# Patient Record
Sex: Male | Born: 1955 | Race: White | Hispanic: No | Marital: Married
Health system: Midwestern US, Academic
[De-identification: ages and names within clinical notes are randomized; demographics above are authoritative.]

## PROBLEM LIST (undated history)

## (undated) DIAGNOSIS — N529 Male erectile dysfunction, unspecified: Secondary | ICD-10-CM

## (undated) DIAGNOSIS — T7840XA Allergy, unspecified, initial encounter: Secondary | ICD-10-CM

## (undated) DIAGNOSIS — I10 Essential (primary) hypertension: Secondary | ICD-10-CM

## (undated) DIAGNOSIS — J302 Other seasonal allergic rhinitis: Secondary | ICD-10-CM

## (undated) DIAGNOSIS — S82899A Other fracture of unspecified lower leg, initial encounter for closed fracture: Secondary | ICD-10-CM

## (undated) DIAGNOSIS — M1990 Unspecified osteoarthritis, unspecified site: Secondary | ICD-10-CM

## (undated) DIAGNOSIS — K222 Esophageal obstruction: Secondary | ICD-10-CM

## (undated) DIAGNOSIS — E785 Hyperlipidemia, unspecified: Secondary | ICD-10-CM

## (undated) DIAGNOSIS — K5790 Diverticulosis of intestine, part unspecified, without perforation or abscess without bleeding: Secondary | ICD-10-CM

## (undated) DIAGNOSIS — K449 Diaphragmatic hernia without obstruction or gangrene: Secondary | ICD-10-CM

## (undated) DIAGNOSIS — H9191 Unspecified hearing loss, right ear: Secondary | ICD-10-CM

## (undated) DIAGNOSIS — K279 Peptic ulcer, site unspecified, unspecified as acute or chronic, without hemorrhage or perforation: Secondary | ICD-10-CM

## (undated) DIAGNOSIS — E119 Type 2 diabetes mellitus without complications: Secondary | ICD-10-CM

## (undated) DIAGNOSIS — C61 Malignant neoplasm of prostate: Secondary | ICD-10-CM

## (undated) DIAGNOSIS — K219 Gastro-esophageal reflux disease without esophagitis: Secondary | ICD-10-CM

## (undated) DIAGNOSIS — S0292XA Unspecified fracture of facial bones, initial encounter for closed fracture: Secondary | ICD-10-CM

## (undated) DIAGNOSIS — I2510 Atherosclerotic heart disease of native coronary artery without angina pectoris: Secondary | ICD-10-CM

## (undated) HISTORY — DX: Hyperlipidemia, unspecified: E78.5

## (undated) HISTORY — DX: Type 2 diabetes mellitus without complications: E11.9

## (undated) HISTORY — DX: Allergy, unspecified, initial encounter: T78.40XA

## (undated) HISTORY — DX: Diaphragmatic hernia without obstruction or gangrene: K44.9

## (undated) HISTORY — DX: Atherosclerotic heart disease of native coronary artery without angina pectoris: I25.10

## (undated) HISTORY — DX: Male erectile dysfunction, unspecified: N52.9

## (undated) HISTORY — DX: Essential (primary) hypertension: I10

## (undated) HISTORY — DX: Unspecified fracture of facial bones, initial encounter for closed fracture: S02.92XA

## (undated) HISTORY — DX: Unspecified hearing loss, right ear: H91.91

## (undated) HISTORY — PX: INGUINAL HERNIA REPAIR: SUR1180

## (undated) HISTORY — DX: Other fracture of unspecified lower leg, initial encounter for closed fracture: S82.899A

## (undated) HISTORY — PX: JOINT REPLACEMENT: SHX530

## (undated) HISTORY — PX: KNEE ARTHROSCOPY: SUR90

## (undated) HISTORY — DX: Esophageal obstruction: K22.2

## (undated) HISTORY — DX: Diverticulosis of intestine, part unspecified, without perforation or abscess without bleeding: K57.90

## (undated) HISTORY — PX: VASECTOMY: SHX75

## (undated) HISTORY — PX: UPPER GASTROINTESTINAL ENDOSCOPY: SHX188

## (undated) HISTORY — PX: FACIAL FRACTURE SURGERY: SHX1570

## (undated) HISTORY — DX: Unspecified osteoarthritis, unspecified site: M19.90

## (undated) HISTORY — DX: Peptic ulcer, site unspecified, unspecified as acute or chronic, without hemorrhage or perforation: K27.9

## (undated) HISTORY — PX: TONSILLECTOMY: SUR1361

## (undated) HISTORY — PX: FRACTURE SURGERY: SHX138

## (undated) HISTORY — DX: Other seasonal allergic rhinitis: J30.2

## (undated) HISTORY — DX: Malignant neoplasm of prostate: C61

## (undated) HISTORY — DX: Gastro-esophageal reflux disease without esophagitis: K21.9

---
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
## 1977-07-15 DIAGNOSIS — S0292XA Unspecified fracture of facial bones, initial encounter for closed fracture: Secondary | ICD-10-CM

## 1977-07-15 HISTORY — DX: Unspecified fracture of facial bones, initial encounter for closed fracture: S02.92XA

## 1987-07-16 DIAGNOSIS — K279 Peptic ulcer, site unspecified, unspecified as acute or chronic, without hemorrhage or perforation: Secondary | ICD-10-CM

## 1987-07-16 HISTORY — DX: Peptic ulcer, site unspecified, unspecified as acute or chronic, without hemorrhage or perforation: K27.9

## 1998-09-25 ENCOUNTER — Encounter: Payer: Self-pay | Admitting: Gastroenterology

## 1998-09-25 ENCOUNTER — Other Ambulatory Visit: Admission: RE | Admit: 1998-09-25 | Discharge: 1998-09-25 | Payer: Self-pay | Admitting: Gastroenterology

## 2005-03-12 ENCOUNTER — Ambulatory Visit: Payer: Self-pay | Admitting: Gastroenterology

## 2006-07-15 DIAGNOSIS — S82899A Other fracture of unspecified lower leg, initial encounter for closed fracture: Secondary | ICD-10-CM

## 2006-07-15 HISTORY — DX: Other fracture of unspecified lower leg, initial encounter for closed fracture: S82.899A

## 2006-07-15 HISTORY — PX: ANKLE SURGERY: SHX546

## 2006-09-29 ENCOUNTER — Ambulatory Visit: Payer: Self-pay | Admitting: Gastroenterology

## 2006-10-29 ENCOUNTER — Encounter (INDEPENDENT_AMBULATORY_CARE_PROVIDER_SITE_OTHER): Payer: Self-pay | Admitting: Specialist

## 2006-10-29 ENCOUNTER — Ambulatory Visit: Payer: Self-pay | Admitting: Gastroenterology

## 2007-01-17 ENCOUNTER — Inpatient Hospital Stay (HOSPITAL_COMMUNITY): Admission: EM | Admit: 2007-01-17 | Discharge: 2007-01-18 | Payer: Self-pay | Admitting: Emergency Medicine

## 2007-04-01 ENCOUNTER — Ambulatory Visit (HOSPITAL_BASED_OUTPATIENT_CLINIC_OR_DEPARTMENT_OTHER): Admission: RE | Admit: 2007-04-01 | Discharge: 2007-04-01 | Payer: Self-pay | Admitting: Orthopedic Surgery

## 2007-09-18 ENCOUNTER — Ambulatory Visit: Payer: Self-pay | Admitting: Gastroenterology

## 2008-09-15 ENCOUNTER — Ambulatory Visit: Payer: Self-pay | Admitting: Gastroenterology

## 2008-09-15 DIAGNOSIS — K219 Gastro-esophageal reflux disease without esophagitis: Secondary | ICD-10-CM | POA: Insufficient documentation

## 2008-09-15 DIAGNOSIS — R198 Other specified symptoms and signs involving the digestive system and abdomen: Secondary | ICD-10-CM | POA: Insufficient documentation

## 2008-09-15 DIAGNOSIS — K644 Residual hemorrhoidal skin tags: Secondary | ICD-10-CM | POA: Insufficient documentation

## 2008-09-15 DIAGNOSIS — K649 Unspecified hemorrhoids: Secondary | ICD-10-CM | POA: Insufficient documentation

## 2009-02-12 DIAGNOSIS — C61 Malignant neoplasm of prostate: Secondary | ICD-10-CM

## 2009-02-12 HISTORY — DX: Malignant neoplasm of prostate: C61

## 2009-07-15 HISTORY — PX: PROSTATECTOMY: SHX69

## 2009-09-13 ENCOUNTER — Telehealth: Payer: Self-pay | Admitting: Gastroenterology

## 2009-10-03 ENCOUNTER — Ambulatory Visit: Payer: Self-pay | Admitting: Gastroenterology

## 2010-05-07 ENCOUNTER — Ambulatory Visit
Admission: RE | Admit: 2010-05-07 | Discharge: 2010-06-11 | Payer: Self-pay | Source: Home / Self Care | Admitting: Radiation Oncology

## 2010-07-10 ENCOUNTER — Inpatient Hospital Stay (HOSPITAL_COMMUNITY)
Admission: RE | Admit: 2010-07-10 | Discharge: 2010-07-11 | Payer: Self-pay | Source: Home / Self Care | Attending: Urology | Admitting: Urology

## 2010-07-24 ENCOUNTER — Telehealth: Payer: Self-pay | Admitting: Gastroenterology

## 2010-08-14 NOTE — Procedures (Signed)
Summary: Gastroenterology-EGD  Gastroenterology-EGD   Imported By: Lowry Ram CMA 09/14/2008 09:18:00  _____________________________________________________________________  External Attachment:    Type:   Image     Comment:   External Document

## 2010-08-14 NOTE — Procedures (Signed)
Summary: Colonoscopy and pathology   Colonoscopy  Procedure date:  10/29/2006  Findings:      Results: Hemorrhoids.     Results: Diverticulosis.       Pathology:  Hyperplastic polyp.     Location:  [facility].    Procedures Next Due Date:    Colonoscopy: 11/2016  Colonoscopy  Procedure date:  10/29/2006  Findings:      Results: Hemorrhoids.     Results: Diverticulosis.       Pathology:  Hyperplastic polyp.     Location:  [facility].    Procedures Next Due Date:    Colonoscopy: 11/2016  Patient Name: Jeff Jenkins, Jeff Jenkins. MRN:  Procedure Procedures: Colonoscopy CPT: 4840379878.    with biopsy. CPT: Q5068410.  Personnel: Endoscopist: Venita Lick. Russella Dar, MD, Clementeen Graham.  Exam Location: Exam performed in Outpatient Clinic. Outpatient  Patient Consent: Procedure, Alternatives, Risks and Benefits discussed, consent obtained, from patient. Consent was obtained by the RN.  Indications Symptoms: Hematochezia.  History  Current Medications: Patient is not currently taking Coumadin.  Pre-Exam Physical: Performed Oct 29, 2006. Cardio-pulmonary exam, Rectal exam, HEENT exam , Abdominal exam, Mental status exam WNL.  Comments: Pt. history reviewed/updated, physical exam performed prior to initiation of sedation?Yes Exam Exam: Extent of exam reached: Cecum, extent intended: Cecum.  The cecum was identified by appendiceal orifice and IC valve. Time to Cecum: 00:04: 48. Time for Withdrawl: 00:09:49. Colon retroflexion performed. ASA Classification: II. Tolerance: excellent.  Monitoring: Pulse and BP monitoring, Oximetry used. Supplemental O2 given.  Colon Prep Used MoviPrep for colon prep. Prep results: excellent.  Sedation Meds: Patient assessed and found to be appropriate for moderate (conscious) sedation. Fentanyl 100 mcg. given IV. Versed 10 mg. given IV.  Findings - DIVERTICULOSIS: Sigmoid Colon. Not bleeding. ICD9: Diverticulosis: 562.10.  NORMAL  EXAM: Cecum to Descending Colon.  MULTIPLE POLYPS: Rectum. minimum size 2 mm, maximum size 3 mm. Procedure:  biopsy without cautery, removed, Polyp retrieved, 2 polyps Polyps sent to pathology. ICD9: Colon Polyps: 211.3.  HEMORRHOIDS: Internal. Size: Small. Not bleeding. Not thrombosed. ICD9: Hemorrhoids, Internal: 455.0.   Assessment  Diagnoses: 562.10: Diverticulosis.  211.3: Colon Polyps.  455.0: Hemorrhoids, Internal.   Events  Unplanned Interventions: No intervention was required.  Unplanned Events: There were no complications. Plans  Post Exam Instructions: Post sedation instructions given.  Medication Plan: Await pathology.  Patient Education: Patient given standard instructions for: Polyps. Diverticulosis. Hemorrhoids.  Disposition: After procedure patient sent to recovery. After recovery patient sent home.  Scheduling/Referral: Colonoscopy, to [facility] T. Russella Dar, MD, [facility], if polyp(s) adenomatous, around Oct 29, 2011.    This report was created from the original endoscopy report, which was reviewed and signed by the above listed endoscopist.             SP Surgical Pathology - STATUS: Final             By: SMIR MD , Jessica Priest           Perform Date: 25Apr08 00:01  Ordered By: Pleas Koch MD , MALCOLM         Ordered Date: 51Apr08 14:43  Facility: [facility]                               Department: CPATH  Service Report Text  [facility]   [address] [address]   [place], [place] [address]   Telephone [phone] or [phone] Fax [phone]    REPORT OF SURGICAL PATHOLOGY    Case #: [id number]   Patient Name: Jeff Jenkins, Jeff Jenkins.   Office Chart Number: [id number]    MRN: [id number]   Pathologist: Havery Moros, MD   DOB/Age 04/25/1956 (Age: 54) Gender: M   Date Taken: 10/29/2006   Date Received: 10/30/2006    FINAL DIAGNOSIS    ***MICROSCOPIC EXAMINATION AND DIAGNOSIS***    RECTUM, POLYP(S): HYPERPLASTIC POLYP(S). NO ADENOMATOUS  CHANGE   OR MALIGNANCY IDENTIFIED.    mj   Date Reported: 10/31/2006 Havery Moros, MD   *** Electronically Signed Out By BNS ***    Clinical information   Rectal bleeding. (cdc)    specimen(s) obtained   Rectum, polyp(s) x 2    Gross Description   Received in formalin are tan, soft tissue fragments that are   submitted in toto. Number: Two   Size: 0.3 m (SP:mj 10/30/06)    mj/

## 2010-08-14 NOTE — Assessment & Plan Note (Signed)
Summary: REV PER RECALL LETTER.Marland KitchenEM   History of Present Illness Visit Type: follow up Primary GI MD: Elie Goody MD [facility] Primary Provider: Merri Brunette, MD Chief Complaint: acid reflux History of Present Illness:   Jeff Jenkins relates occasional breakthrough reflux symptoms related to eating late, certain foods and alcohol. His symptoms generally remain under very good control. He reports an episode of constipation related to a Medrol Dosepak. He occasionally notes a loose stool occurring within 10-20 minutes after a formed bowel movement. He also relates occasional external hemorrhoid discomfort and bleeding.   GI Review of Systems    Reports acid reflux, belching, and  bloating.      Denies abdominal pain, chest pain, dysphagia with liquids, dysphagia with solids, heartburn, loss of appetite, nausea, vomiting, vomiting blood, weight loss, and  weight gain.      Reports constipation.     Denies anal fissure, black tarry stools, change in bowel habit, diarrhea, diverticulosis, fecal incontinence, heme positive stool, hemorrhoids, irritable bowel syndrome, jaundice, light color stool, liver problems, rectal bleeding, and  rectal pain.   Prior Medications Reviewed Using: Patient Recall  Updated Prior Medication List: NEXIUM 40 MG CPDR (ESOMEPRAZOLE MAGNESIUM) one tablet by mouth once daily ALLEGRA-D 24 HOUR 180-240 MG XR24H-TAB (FEXOFENADINE-PSEUDOEPHEDRINE) as needed PERCOCET 5-325 MG TABS (OXYCODONE-ACETAMINOPHEN) Take 1 every 6 hours as needed  Current Allergies: No known allergies  Past Medical History:    Reviewed history from 09/14/2008 and no changes required:       GERD       Peptic stricture       Diverticulosis       Hemorrhoids       Peptic Ulcer Disease 1989       Facial fracture 1979       Right ear hearing loss       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     Alcohol Use - yes    Daily Caffeine Use    Illicit Drug Use - no    Patient does not get regular exercise.   Risk Factors:  Tobacco use:  never Drug use:  no Alcohol use:  yes Exercise:  no  Review of Systems       The patient complains of allergy/sinus, arthritis/joint pain, cough, muscle pains/cramps, nosebleeds, swelling of feet/legs, and urine leakage.         The pertinent positives and negatives are noted as above and in the HPI. All other ROS were negative.   Vital Signs:  Patient Profile:   55 Years Old Male Height:     72 inches Weight:      227.13 pounds Pulse rate:   88 / minute Pulse rhythm:   regular BP sitting:   140 / 94  (left arm)  Vitals Entered By: June McMurray CMA (September 15, 2008 9:26 AM)                  Physical Exam  General:     Well developed, well nourished, no acute distress. Head:     Normocephalic and atraumatic.  Mouth:     No deformity or lesions, dentition normal. Lungs:     Clear throughout to auscultation. Heart:     Regular rate and rhythm; no murmurs, rubs,  or bruits. Abdomen:     Soft, nontender and nondistended. No masses, hepatosplenomegaly or hernias noted. Normal bowel sounds. Psych:     Alert and cooperative. Normal mood and affect.   Impression & Recommendations:  Problem # 1:  GERD (ICD-530.81) Maintain standard antireflux measures. Begin Nexium 40 mg q.a.m. for one year.  Problem # 2:  CHANGE IN BOWELS (ICD-787.99) Increase fiber and water intake. Start a daily fiber supplement. If his constipation and loose stools are not substantially improved with this regimen he will return for further followup.  Problem # 3:  HEMORRHOIDS (ICD-455.6) Anusol-HC cream b.i.d. p.r.n. and standard rectal and  hemorrhoidal care instructions.  Patient Instructions: 1)  Start a daily fiber supplement such as Benefiber or Citrucel clear.  2)  Fill your prescription at your pharmacy or mail order.  3)  Rectal care instructions given.  4)  Please schedule a follow-up appointment in 1 year. 5)  Copy Sent To: Merri Brunette, MD  Prescriptions: NEXIUM 40 MG CPDR (ESOMEPRAZOLE MAGNESIUM) one tablet by mouth once daily  #90 x 3   Entered by:   Christie Nottingham CMA   Authorized by:   Meryl Dare MD [facility]   Signed by:   Meryl Dare MD FACG on 09/15/2008   Method used:   Print then Give to Patient   RxID:   [id number]   Appended Document: REV PER RECALL LETTER.Marland KitchenEM    Clinical Lists Changes  Medications: Rx of NEXIUM 40 MG CPDR (ESOMEPRAZOLE MAGNESIUM) one tablet by mouth once daily;  #90 x 3;  Signed;  Entered by: Christie Nottingham CMA;  Authorized by: Meryl Dare MD FACG;  Method used: Print then Give to Patient    Prescriptions: NEXIUM 40 MG CPDR (ESOMEPRAZOLE MAGNESIUM) one tablet by mouth once daily  #90 x 3   Entered by:   Christie Nottingham CMA   Authorized by:   Meryl Dare MD [facility]   Signed by:   Christie Nottingham CMA on 09/15/2008   Method used:   Print then Give to Patient   RxID:   [id number]

## 2010-08-14 NOTE — Progress Notes (Signed)
Summary: refill   Phone Note Call from Patient Call back at [phone]   Caller: Patient Call For: Russella Dar Reason for Call: Refill Medication, Talk to Nurse Summary of Call: Patient would like refills for his nexium until his appt 3-23 Initial call taken by: Tawni Levy,  September 13, 2009 8:40 AM  Follow-up for Phone Call        sent 30 day rx until patietn comes for his office visit.  Follow-up by: Harlow Mares CMA (AAMA),  September 13, 2009 9:01 AM    New/Updated Medications: NEXIUM 40 MG CPDR (ESOMEPRAZOLE MAGNESIUM) one tablet by mouth once daily Prescriptions: NEXIUM 40 MG CPDR (ESOMEPRAZOLE MAGNESIUM) one tablet by mouth once daily  #30 x 0   Entered by:   Harlow Mares CMA (AAMA)   Authorized by:   Meryl Dare MD [facility]   Signed by:   Harlow Mares CMA (AAMA) on 09/13/2009   Method used:   Electronically to        [facility]* (retail)       [address] [place], [place]  [address]       Ph: [phone] or [phone]       Fax: [phone]   RxID:   [id number]

## 2010-08-14 NOTE — Assessment & Plan Note (Signed)
Summary: YEARLY RX RENEWAL/YF   History of Present Illness Visit Type: Follow-up Visit Primary GI MD: Elie Goody MD [facility] Primary Provider: Merri Brunette, MD Chief Complaint: Patient here for his yearly follow up of his gerd, states that he has a little reflux but nothing major. He needs refills on his Nexium.  History of Present Illness:   This is a return office visit for GERD that is well-controlled. He has not had recurrent dysphagia. He notes hard stools for the past several months. Occasional rectal discomfort and small amounts of rectal bleeding. He has no internal hemorrhoids by colonoscopy in April 2008.   GI Review of Systems    Reports acid reflux.      Denies abdominal pain, belching, bloating, chest pain, dysphagia with liquids, dysphagia with solids, heartburn, loss of appetite, nausea, vomiting, vomiting blood, weight loss, and  weight gain.      Reports change in bowel habits.     Denies anal fissure, black tarry stools, constipation, diarrhea, diverticulosis, fecal incontinence, heme positive stool, hemorrhoids, irritable bowel syndrome, jaundice, light color stool, liver problems, rectal bleeding, and  rectal pain.   Current Medications (verified): 1)  Nexium 40 Mg Cpdr (Esomeprazole Magnesium) .... One Tablet By Mouth Once Daily 2)  Allegra-D 24 Hour 180-240 Mg Xr24h-Tab (Fexofenadine-Pseudoephedrine) .... As Needed 3)  Benadryl 25 Mg Tabs (Diphenhydramine Hcl) .... 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  Alcohol Use - yes Illicit Drug Use - no Patient does not get regular exercise.   Review of Systems       The patient complains of allergy/sinus, arthritis/joint pain, muscle pains/cramps, urination - excessive, and urine leakage.         The pertinent positives and negatives are noted as above and in the HPI. All other ROS were reviewed and were negative.   Vital Signs:  Patient profile:   55 year old male Height:      72 inches Weight:      213.8 pounds BMI:     29.10 Pulse rate:   80 / minute Pulse rhythm:   regular BP sitting:   140 / 82  (left arm) Cuff size:   regular  Vitals Entered By: Harlow Mares CMA Duncan Dull) (October 03, 2009 9:33 AM)  Physical Exam  General:  Well developed, well nourished, no acute distress. Head:  Normocephalic and atraumatic. Eyes:  PERRLA, no icterus. Mouth:  No deformity or lesions, dentition normal. Lungs:  Clear throughout to auscultation. Heart:  Regular rate and rhythm; no murmurs, rubs,  or bruits. Abdomen:  Soft, nontender and nondistended. No masses, hepatosplenomegaly or hernias noted. Normal bowel sounds. Psych:  Alert and cooperative. Normal mood and affect.  Impression & Recommendations:  Problem # 1:  GERD (ICD-530.81) Continue Nexium 40 mg q.a.m. If his dysphagia returns or if his reflux symptoms are not controlled, he is advised to contact us.  Problem # 2:  CHANGE IN BOWELS (ICD-787.99) Mild constipation with hard stools and occasional hemorrhoidal  symptoms. Substantially increase daily fiber and water intake. Begin daily Benefiber supplements. Preparation H with hydrocortisone. Standard rectal care instructions. If symptoms do not resolve within the next few weeks return for further followup.  Problem # 3:  SCREENING COLORECTAL-CANCER (ICD-V76.51) Colonoscopy for colorectal cancer screening April  2018.  Patient Instructions: 1)  Start Benefiber once daily. 2)  Pick up your prescription from your pharmacy.  3)  High Fiber, Low Fat  Healthy Eating Plan brochure given.  4)  Rectal care instructions given.  5)  Copy sent to : Merri Brunette, MD 6)  The medication list was reviewed and reconciled.  All changed / newly prescribed medications were explained.  A complete medication list was provided to the patient / caregiver. 7)  Please schedule a follow-up appointment in 1 year.  Prescriptions: NEXIUM 40 MG CPDR (ESOMEPRAZOLE MAGNESIUM) one tablet by mouth once daily  #90 x 3   Entered by:   Christie Nottingham CMA (AAMA)   Authorized by:   Meryl Dare MD [facility]   Signed by:   Christie Nottingham CMA Duncan Dull) on 10/03/2009   Method used:   Electronically to        [facility]* (mail-order)       [facility]       [address]       [place], [place]  [address]       Ph: [phone]       Fax: [phone]   RxID:   [id number] NEXIUM 40 MG CPDR (ESOMEPRAZOLE MAGNESIUM) one tablet by mouth once daily  #90 x 3   Entered by:   Christie Nottingham CMA (AAMA)   Authorized by:   Meryl Dare MD [facility]   Signed by:   Christie Nottingham CMA (AAMA) on 10/03/2009   Method used:   Electronically to        [facility]* (retail)       [address] [place], [place]  [address]       Ph: [phone] or [phone]       Fax: [phone]   RxID:   [id number]

## 2010-08-16 NOTE — Progress Notes (Signed)
Summary: refill  Medications Added NEXIUM 40 MG CPDR (ESOMEPRAZOLE MAGNESIUM) one tablet by mouth once daily       Phone Note Call from Patient Call back at [phone]   Caller: Patient Call For: Dr Russella Dar Summary of Call: Patient needs refills for his Nexium for a three months supply sent to [facility] Initial call taken by: Tawni Levy,  July 24, 2010 4:16 PM  Follow-up for Phone Call        Rx was sent to pts pharmacy and told pt that he is due for a follow-up visit in 3 months. Pt agreed and verbalized understanding. Follow-up by: Christie Nottingham CMA Duncan Dull),  July 24, 2010 4:23 PM    New/Updated Medications: NEXIUM 40 MG CPDR (ESOMEPRAZOLE MAGNESIUM) one tablet by mouth once daily Prescriptions: NEXIUM 40 MG CPDR (ESOMEPRAZOLE MAGNESIUM) one tablet by mouth once daily  #90 x 0   Entered by:   Christie Nottingham CMA (AAMA)   Authorized by:   Meryl Dare MD [facility]   Signed by:   Christie Nottingham CMA Duncan Dull) on 07/24/2010   Method used:   Electronically to        [facility]* (mail-order)       [address] [place], [place]  [address]       Ph: [phone]       Fax: [phone]   RxID:   [id number]   Appended Document: refill Pt states he wants the prescription printed and sent in with his mail order form because he has never used [facility] before. Printed Rx and left out front for pt to pick up tomorrow.   Clinical Lists Changes  Medications: Rx of NEXIUM 40 MG CPDR (ESOMEPRAZOLE MAGNESIUM) one tablet by mouth once daily;  #90 x 0;  Signed;  Entered by: Christie Nottingham CMA (AAMA);  Authorized by: Meryl Dare MD FACG;  Method used: Print then Give to Patient    Prescriptions: NEXIUM 40 MG CPDR (ESOMEPRAZOLE MAGNESIUM) one tablet by mouth once daily  #90 x 0   Entered by:   Christie Nottingham CMA (AAMA)   Authorized by:   Meryl Dare MD [facility]   Signed by:   Christie Nottingham CMA (AAMA) on 07/24/2010   Method used:   Print then  Give to Patient   RxID:   [id number]

## 2010-09-24 LAB — CBC
HCT: 43.4 % (ref 39.0–52.0)
Hemoglobin: 15.3 g/dL (ref 13.0–17.0)
MCH: 31.5 pg (ref 26.0–34.0)
MCHC: 35.3 g/dL (ref 30.0–36.0)
MCV: 89.5 fL (ref 78.0–100.0)
Platelets: 157 10*3/uL (ref 150–400)
RBC: 4.85 MIL/uL (ref 4.22–5.81)
RDW: 12.1 % (ref 11.5–15.5)
WBC: 5.6 10*3/uL (ref 4.0–10.5)

## 2010-09-24 LAB — BASIC METABOLIC PANEL
BUN: 10 mg/dL (ref 6–23)
CO2: 27 mEq/L (ref 19–32)
Calcium: 8.8 mg/dL (ref 8.4–10.5)
Chloride: 102 mEq/L (ref 96–112)
Creatinine, Ser: 1.01 mg/dL (ref 0.4–1.5)
GFR calc Af Amer: >60 mL/min (ref >60)
GFR calc non Af Amer: >60 mL/min (ref >60)
Glucose, Bld: 187 mg/dL — ABNORMAL HIGH (ref 70–99)
Potassium: 3.9 mEq/L (ref 3.5–5.1)
Sodium: 137 mEq/L (ref 135–145)

## 2010-09-24 LAB — HEMOGLOBIN AND HEMATOCRIT, BLOOD
HCT: 41.5 % (ref 39.0–52.0)
HCT: 44.6 % (ref 39.0–52.0)
Hemoglobin: 14.2 g/dL (ref 13.0–17.0)
Hemoglobin: 15.4 g/dL (ref 13.0–17.0)

## 2010-09-24 LAB — TYPE AND SCREEN
ABO/RH(D): A POS
Antibody Screen: NEGATIVE

## 2010-09-24 LAB — SURGICAL PCR SCREEN
MRSA, PCR: NEGATIVE
Staphylococcus aureus: NEGATIVE

## 2010-09-24 LAB — ABO/RH: ABO/RH(D): A POS

## 2010-10-15 ENCOUNTER — Ambulatory Visit (INDEPENDENT_AMBULATORY_CARE_PROVIDER_SITE_OTHER): Payer: Self-pay | Admitting: Gastroenterology

## 2010-10-15 ENCOUNTER — Encounter: Payer: Self-pay | Admitting: Gastroenterology

## 2010-10-15 DIAGNOSIS — Z1211 Encounter for screening for malignant neoplasm of colon: Secondary | ICD-10-CM

## 2010-10-15 DIAGNOSIS — K219 Gastro-esophageal reflux disease without esophagitis: Secondary | ICD-10-CM

## 2010-10-15 MED ORDER — ESOMEPRAZOLE MAGNESIUM 40 MG PO CPDR: 40.0000 mg | DELAYED_RELEASE_CAPSULE | Freq: Every day | ORAL | Status: DC

## 2010-10-15 NOTE — Assessment & Plan Note (Addendum)
Reflux symptoms under very good control on Nexium and antireflux measures. Refill Nexium 40 mg every morning. I discussed the potential of osteoporosis and magnesium malabsorption on chronic proton pump inhibitor therapy. Discussed the option of having a bone scan and having magnesium levels checked at his routine followup with Dr. Renne Crigler.

## 2010-10-15 NOTE — Patient Instructions (Signed)
Nexium has been sent to your mail order pharmacy at [facility] for a 90 day supply. Nexium samples have been given to take while waiting on prescription in the mail.

## 2010-10-15 NOTE — Progress Notes (Signed)
History of Present Illness: This is a 55 year old male with chronic GERD is well-controlled on Nexium 40 mg daily. He relates very rare episodes of breakthrough symptoms when he eats late at night. He has no dysphasia, weight loss, chest pain, abdominal pain, change in bowel habits, melena, hematochezia.  Current Medications, Allergies, Past Medical History, Past Surgical History, Family History and Social History were reviewed in [employer] record.  Physical Exam: General: Well developed , well nourished, no acute distress Head: Normocephalic and atraumatic Eyes:  sclerae anicteric, EOMI Ears: Normal auditory acuity Mouth: No deformity or lesions Lungs: Clear throughout to auscultation Heart: Regular rate and rhythm; no murmurs, rubs or bruits Abdomen: Soft, non tender and non distended. No masses, hepatosplenomegaly or hernias noted. Normal Bowel sounds Musculoskeletal: Symmetrical with no gross deformities  Extremities: No clubbing, cyanosis, edema or deformities noted Neurological: Alert oriented x 4, grossly nonfocal Psychological:  Alert and cooperative. Normal mood and affect  Assessment and Recommendations:

## 2010-10-15 NOTE — Assessment & Plan Note (Signed)
Average risk for colorectal cancer. Screening colonoscopy recommended April 2018.

## 2010-11-27 NOTE — Op Note (Signed)
NAMEJAHAZIEL, FRANCOIS               ACCOUNT NO.:  [id number]   MEDICAL RECORD NO.:  [id number]          PATIENT TYPE:  INP   LOCATION:  0454                         FACILITY:  [facility]   PHYSICIAN:  Marlowe Kays, M.D.  DATE OF BIRTH:  08-11-1955   DATE OF PROCEDURE:  01/17/2007  DATE OF DISCHARGE:                               OPERATIVE REPORT   PREOPERATIVE DIAGNOSIS:  Fracture distal fibula with widening of ankle  mortise and suspected that deltoid ligament tear.   POSTOPERATIVE DIAGNOSIS:  Fracture distal left fibula with complete  avulsion of the deltoid ligament off the medial malleolus and lateral  subluxation of the talus.   OPERATION:  1. Compression plating distal left fibula with five hole plate and two      syndesmosis screws with a reconstruction of the syndesmosis.  2. Repair of deltoid ligament avulsion off the medial malleolus using      rotator cuff anchor.   SURGEON:  Marlowe Kays, M.D.   ASSISTANT:  Mr. Adrian Blackwater.   ANESTHESIA:  General anesthesia.   PATHOLOGY AND JUSTIFICATION FOR PROCEDURE:  This man slipped and fell  earlier today with x-rays demonstrating the above pathology.  He is in  good health and is here today for surgical repair.   DESCRIPTION OF PROCEDURE:  Prophylactic antibiotics, satisfactory  general anesthesia, time-out performed, pneumatic tourniquet with the  left leg Esmarched out nonsterilely and prepped with DuraPrep from mid  calf to toes, draped in sterile field.  First made a curved incision  anterior to the medial malleolus, went through subcutaneous tissue,  large amount of blood came forth.  The deltoid ligament had completely  avulsed off the medial malleolus with no remnant remaining and was  invaginated between the medial malleolus and the talus.  After  extracting it and manually slightly closing down the subluxation, I then  used a C-arm and made a lateral incision over the distal fibula working  above the fracture  site and locating appropriate position for  syndesmosis screws.  I selected a five-hole plate with two holes distal  to the fracture site which was all the fibula with hold because of its  distal extent and one hole at the fracture site which I was able to use  for syndesmosis screw and two holes more proximal.  I first prebent the  plate to conform to the distal fibular anatomy and then placed two loose  but stabilizing screws at both ends of the plate, checking the position  with C-arm, in each case drilling, measuring and screwing.  I then  placed and tightened down the third nonsyndesmosis screws and then  placed two screws across the syndesmosis, checking position on AP and  lateral x-rays.  This gave a nice firm reconstitution for the  syndesmosis and the distal fibular fracture.  I then took a two-pronged  rotator cuff anchor and after predrilling a small hole, placed it  perpendicular to the medial malleolus and checking its position on C-arm  found that it was not in the joint and was buried completely.  We then  placed the two  wings of the anchor through both layers of the deltoid  ligament and then out more laterally and superiorly to an outer layer  which had remained intact, cinching the deltoid ligament down snugly.  Then supplemented this with multiple #1 Ethibond sutures above and below  completely reconstituting the deltoid ligament.  Final AP and lateral x-  rays were taken confirming that the mortise had been restored and the  distal fibular fracture was anatomically replaced.  I then irrigated  both wounds with sterile saline.  The subcutaneous tissue and medial  wound was closed with 2-0 Vicryl, skin with interrupted 3-0 nylon  mattress sutures laterally.  I used it 2-0 Vicryl in the muscle and  subcutaneous tissue and 3-0 nylon in the skin.  Betadine, Adaptic and  dry sterile dressing were applied with short-leg splint-type cast.  He  was given 30 mg of Toradol IV.   Tourniquet was released.  Both wounds  were also infiltrated with 0.50% plain Marcaine.  He tolerated the  procedure well and was taken to the recovery room in satisfactory  condition with no known complications.           ______________________________  Marlowe Kays, M.D.     JA/MEDQ  D:  01/17/2007  T:  01/18/2007  Job:  [id number]

## 2010-11-27 NOTE — Assessment & Plan Note (Signed)
[facility]                         GASTROENTEROLOGY OFFICE NOTE   AYSON, CHERUBINI                      MRN:          [id number]  DATE:09/18/2007                            DOB:          Nov 21, 1955    Mr. Schellenberg returns for followup of GERD with a history of a peptic  stricture.  He states he has rare episodes of reflux, generally related  to eating late at night.  He has no dysphagia or odynophagia.   CURRENT MEDICATIONS:  Listed on the chart, updated and reviewed.   MEDICATION ALLERGIES:  None known.   EXAM:  Overweight, white male, in no acute distress.  Weight 221.6 pounds, blood pressure is 134/84, pulse 84 and regular.  CHEST:  Clear to auscultation bilaterally.  CARDIAC:  Regular rate and rhythm without murmurs.  ABDOMEN:  Soft and nontender with normoactive bowel sounds.   ASSESSMENT AND PLAN:  1. GERD with a history of peptic stricture.  Maintain standard      antireflux measures and Nexium 40 mg p.o. q.a.m.  Refills supplied      for one year.  Return office visit in one year.  2. Colorectal cancer screening.  Screening colonoscopy recommended for      April 2018.  3. Diverticulosis and internal hemorrhoids.  Long-term high-fiber diet      with adequate fluid intake.     Venita Lick. Russella Dar, MD, [facility]  Electronically Signed    MTS/MedQ  DD: 09/18/2007  DT: 09/18/2007  Job #: [id number]

## 2010-11-27 NOTE — Op Note (Signed)
NAME:  Jeff Jenkins, Jeff Jenkins               ACCOUNT NO.:  [id number]   MEDICAL RECORD NO.:  [id number]          PATIENT TYPE:  AMB   LOCATION:  [facility]                         FACILITY:  [facility]   PHYSICIAN:  Marlowe Kays, M.D.  DATE OF BIRTH:  11-Jun-1956   DATE OF PROCEDURE:  04/01/2007  DATE OF DISCHARGE:                               OPERATIVE REPORT   PREOPERATIVE DIAGNOSIS:  Two retained syndesmosis screws left ankle,  status post repair of deltoid ligament and ORIF distal femur fracture  with widening of syndesmosis.   POSTOPERATIVE DIAGNOSIS:  Two retained syndesmosis screws left ankle,  status post repair of deltoid ligament and ORIF distal femur fracture  with widening of syndesmosis.   OPERATIONS:  Screw exchange,left ankle with removal of the two  syndesmosis screws.   SURGEON:  Marlowe Kays, M.D.   ASSISTANTDruscilla Brownie. Cherlynn June.   ANESTHESIA:  General.   PATHOLOGY AND JUSTIFICATION FOR PROCEDURE:  Original surgery was roughly  11 weeks ago.  The distal fibular fracture has healed.  The plan now is  to remove the two screws across syndesmosis he can begin weightbearing  as tolerated.   PROCEDURE:  Satisfactory general anesthesia, pneumatic tourniquet, left  leg was Esmarched out non sterilely and prepped with DuraPrep from  midcalf to toes and draped in sterile field.  Time-out performed.  Using  a mini C-arm I localized the proximal level of the two screws and then  made incision through the old incision at this location.  I then using  the C-arm to positively localize the two screws which were removed and  replaced with one 16 and one 14 mm screws.  Position confirmed by C-arm.  Wound was infiltrated with half percent plain Marcaine and closed  interrupted 3-0 Vicryl subcutaneous tissue interrupted 4-0 nylon  mattress sutures in the skin.  Betadine Adaptic dry sterile dressing  were applied.  Tourniquet was released.  At the time of this dictation  is on his way  to recovery room in satisfactory condition with no known  complications.           ______________________________  Marlowe Kays, M.D.     JA/MEDQ  D:  04/01/2007  T:  04/02/2007  Job:  [id number]

## 2010-11-30 NOTE — Assessment & Plan Note (Signed)
Jeff Jenkins                         GASTROENTEROLOGY OFFICE NOTE   LYNNWOOD, BECKFORD                      MRN:          [id number]  DATE:09/29/2006                            DOB:          04-04-1956    Jeff Jenkins has had excellent control of his reflux symptoms. He relates  rare episodes of breakthrough when he eats late at night, otherwise his  symptoms are under excellent control. He has noted a rare episode of  difficulty, maybe once or twice over the past two years, with swallowing  meat or bread. These symptoms were brief and have not recurred. He has  had problems with intermittent rectal itching and small amounts of  rectal bleeding associated with bowel movements. He has been diagnosed  with external hemorrhoids by his primary physician and has used over-the-  counter hydrocortisone cream with only minimal results. He has had no  change in bowel habits, melena or change in stool caliber. He denies any  weight loss. He denies a family history of colon cancer. He does have  about three bowel movements a day and occasionally he will have fairly  urgent bowel movement 15-30 minutes after he has just had a bowel  movement. These symptoms have been present for some time and have not  substantially changed.   CURRENT MEDICATIONS:  As listed on the chart, updated and reviewed.   MEDICATION ALLERGIES:  None known.   PHYSICAL EXAMINATION:  In no acute distress. Weight is 215.6 pounds.  Blood pressure is 154/92, pulse is 60 and regular.  CHEST: Clear to auscultation bilaterally.  CARDIAC: Regular rate and rhythm without murmurs.  ABDOMEN: Soft and nontender with normoactive bowel sounds.  RECTAL: Examination deferred to time of colonoscopy.   ASSESSMENT/PLAN:  1. Gastroesophageal reflux disease with a history of a peptic      stricture. Maintain standard anti-reflux measures and Nexium 40 mg      p.o. q  a.m. Return office visit for this  probably in one year.  2. Small volume hematochezia and rectal itching. I suspect hemorroidal      symptoms but we need to exclude proctitis and colorectal neoplasms.      Risks, benefits and alternatives to colonoscopy with possible      biopsy, possible polypectomy and possible destruction of internal      hemorrhoids discussed with the patient and he consents to proceed      and this will be scheduled electively. Begin Analpram 2.5% HC cream      b.i.d. for the next two weeks and then may use p.r.n. He is given      written instructions on all standard rectal care and      hemorrhoidal measures. He is advised to increase his fiber and      fluid intake in an attempt to regularize his bowel habits.     Venita Lick. Russella Dar, MD, [facility]  Electronically Signed    MTS/MedQ  DD: 09/29/2006  DT: 09/29/2006  Job #: [id number]   cc:   Soyla Murphy. Renne Crigler, M.D.

## 2011-01-29 ENCOUNTER — Other Ambulatory Visit: Payer: Self-pay

## 2011-01-29 MED ORDER — ESOMEPRAZOLE MAGNESIUM 40 MG PO CPDR: 40.0000 mg | DELAYED_RELEASE_CAPSULE | Freq: Every day | ORAL | Status: DC

## 2011-01-29 NOTE — Telephone Encounter (Signed)
Prescription resent to [facility] per faxed request.

## 2011-04-25 LAB — POCT HEMOGLOBIN-HEMACUE
Hemoglobin: 14.5
Operator id: 268271

## 2011-04-30 LAB — PROTIME-INR
INR: 1
Prothrombin Time: 13.1

## 2011-04-30 LAB — URINALYSIS, ROUTINE W REFLEX MICROSCOPIC
Bilirubin Urine: NEGATIVE
Glucose, UA: 100 — AB
Hgb urine dipstick: NEGATIVE
Ketones, ur: NEGATIVE
Nitrite: NEGATIVE
Protein, ur: NEGATIVE
Specific Gravity, Urine: 1.023
Urobilinogen, UA: 0.2
pH: 6

## 2011-04-30 LAB — CBC
HCT: 46.6
Hemoglobin: 16.5
MCHC: 35.3
MCV: 88.8
Platelets: 188
RBC: 5.25
RDW: 12.9
WBC: 9.5

## 2011-04-30 LAB — APTT: aPTT: 30

## 2011-04-30 LAB — BASIC METABOLIC PANEL
BUN: 14
CO2: 28
Calcium: 9.8
Chloride: 104
Creatinine, Ser: 0.73
GFR calc Af Amer: >60
GFR calc non Af Amer: >60
Glucose, Bld: 119 — ABNORMAL HIGH
Potassium: 3.7
Sodium: 139

## 2011-04-30 LAB — DIFFERENTIAL
Basophils Absolute: 0
Basophils Relative: 0
Eosinophils Absolute: 0
Eosinophils Relative: 1
Lymphocytes Relative: 15
Lymphs Abs: 1.5
Monocytes Absolute: 0.7
Monocytes Relative: 8
Neutro Abs: 7.2
Neutrophils Relative %: 76

## 2011-07-16 HISTORY — PX: KNEE ARTHROSCOPY: SUR90

## 2011-11-18 ENCOUNTER — Other Ambulatory Visit: Payer: Self-pay | Admitting: Gastroenterology

## 2011-11-18 ENCOUNTER — Encounter: Payer: Self-pay | Admitting: Gastroenterology

## 2011-11-18 ENCOUNTER — Ambulatory Visit (INDEPENDENT_AMBULATORY_CARE_PROVIDER_SITE_OTHER): Payer: Managed Care, Other (non HMO) | Admitting: Gastroenterology

## 2011-11-18 VITALS — BP 162/104 | HR 104 | Ht 72.0 in | Wt 215.8 lb

## 2011-11-18 DIAGNOSIS — K219 Gastro-esophageal reflux disease without esophagitis: Secondary | ICD-10-CM

## 2011-11-18 MED ORDER — ESOMEPRAZOLE MAGNESIUM 40 MG PO CPDR: 40.0000 mg | DELAYED_RELEASE_CAPSULE | Freq: Every day | ORAL | Status: DC

## 2011-11-18 NOTE — Progress Notes (Signed)
History of Present Illness: This is a 56 year old male with chronic GERD that is well controlled on daily Nexium. He has occasional breakthrough nighttime symptoms when he eats late. He notes that his stool frequency has changed from 2 or 3 times a day to once daily. His stools are well formed and he is no discomfort. Colonoscopy in 2008 showed diverticulosis and hemorrhoids and hyperplastic polyps. Denies weight loss, abdominal pain, constipation, diarrhea, change in stool caliber, melena, hematochezia, nausea, vomiting, dysphagia, chest pain.  Current Medications, Allergies, Past Medical History, Past Surgical History, Family History and Social History were reviewed in [employer] record.  Physical Exam: General: Well developed , well nourished, no acute distress Head: Normocephalic and atraumatic Eyes:  sclerae anicteric, EOMI Ears: Normal auditory acuity Mouth: No deformity or lesions Lungs: Clear throughout to auscultation Heart: Regular rate and rhythm; no murmurs, rubs or bruits Abdomen: Soft, non tender and non distended. No masses, hepatosplenomegaly or hernias noted. Normal Bowel sounds Musculoskeletal: Symmetrical with no gross deformities  Pulses:  Normal pulses noted Extremities: No clubbing, cyanosis, edema or deformities noted Neurological: Alert oriented x 4, grossly nonfocal Psychological:  Alert and cooperative. Normal mood and affect  Assessment and Recommendations:  1. GERD. Continue Nexium 40 mg every morning and standard antireflux measures.  2. Elevated blood pressure at 162/104. He is advised to followup with his PCP.  3. Variation in bowel habits. Likely clinically insignificant. If he has further changes in bowel habits, develops bleeding, change in stool caliber abdominal pain or other symptoms he is advised to call.

## 2011-11-18 NOTE — Telephone Encounter (Signed)
[facility]

## 2011-11-18 NOTE — Patient Instructions (Signed)
We have sent the following medications to your pharmacy: Nexium Go to your Primary Care Physician about your elevated Blood Pressure. cc: Merri Brunette, MD

## 2011-11-21 ENCOUNTER — Other Ambulatory Visit: Payer: Self-pay

## 2011-11-21 MED ORDER — ESOMEPRAZOLE MAGNESIUM 40 MG PO CPDR: 40.0000 mg | DELAYED_RELEASE_CAPSULE | Freq: Every day | ORAL | Status: DC

## 2012-11-02 ENCOUNTER — Encounter: Payer: Self-pay | Admitting: *Deleted

## 2012-11-17 ENCOUNTER — Ambulatory Visit: Payer: Managed Care, Other (non HMO) | Admitting: Gastroenterology

## 2012-11-30 ENCOUNTER — Encounter: Payer: Self-pay | Admitting: Gastroenterology

## 2012-11-30 ENCOUNTER — Other Ambulatory Visit: Payer: Self-pay | Admitting: Gastroenterology

## 2012-11-30 ENCOUNTER — Ambulatory Visit (INDEPENDENT_AMBULATORY_CARE_PROVIDER_SITE_OTHER): Payer: Managed Care, Other (non HMO) | Admitting: Gastroenterology

## 2012-11-30 VITALS — BP 140/82 | HR 95 | Ht 72.0 in | Wt 198.6 lb

## 2012-11-30 DIAGNOSIS — K219 Gastro-esophageal reflux disease without esophagitis: Secondary | ICD-10-CM

## 2012-11-30 MED ORDER — ESOMEPRAZOLE MAGNESIUM 40 MG PO CPDR: 40.0000 mg | DELAYED_RELEASE_CAPSULE | Freq: Every day | ORAL | Status: DC

## 2012-11-30 NOTE — Progress Notes (Signed)
History of Present Illness: This is a 57 year old male with chronic GERD and history of peptic stricture. His reflux symptoms are very well controlled on daily Nexium. He notes a slight variation in bowel habits since he changed his diet spelled 1-2 bowel movements each day he generally has a bowel movement every day or every other day. No other symptoms. Underwent colonoscopy in 2008. Denies weight loss, abdominal pain, constipation, diarrhea, change in stool caliber, melena, hematochezia, nausea, vomiting, dysphagia, chest pain.  Current Medications, Allergies, Past Medical History, Past Surgical History, Family History and Social History were reviewed in [employer] record.  Physical Exam: General: Well developed , well nourished, no acute distress Head: Normocephalic and atraumatic Eyes:  sclerae anicteric, EOMI Ears: Normal auditory acuity Mouth: No deformity or lesions Lungs: Clear throughout to auscultation Heart: Regular rate and rhythm; no murmurs, rubs or bruits Abdomen: Soft, non tender and non distended. No masses, hepatosplenomegaly or hernias noted. Normal Bowel sounds Musculoskeletal: Symmetrical with no gross deformities  Pulses:  Normal pulses noted Extremities: No clubbing, cyanosis, edema or deformities noted Neurological: Alert oriented x 4, grossly nonfocal Psychological:  Alert and cooperative. Normal mood and affect  Assessment and Recommendations:  1. GERD. Continue standard antireflux measures and Nexium 40 mg daily.  2. Colorectal cancer screening, average risk. Screening colonoscopy in April 2018.  3. Slight variation in bowel habits likely clinically insignificant. Increase dietary fiber and daily water intake. Contact us if symptoms worsen.

## 2012-11-30 NOTE — Telephone Encounter (Signed)
Patient called and needed Nexium sent to [facility]. Rx sent

## 2012-11-30 NOTE — Patient Instructions (Addendum)
We have sent the following medications to your pharmacy for you to pick up at your convenience: Nexium.  High-Fiber Diet Fiber is found in fruits, vegetables, and grains. A high-fiber diet encourages the addition of more whole grains, legumes, fruits, and vegetables in your diet. The recommended amount of fiber for adult males is 38 g per day. For adult females, it is 25 g per day. Pregnant and lactating women should get 28 g of fiber per day. If you have a digestive or bowel problem, ask your caregiver for advice before adding high-fiber foods to your diet. Eat a variety of high-fiber foods instead of only a select few type of foods.  PURPOSE  To increase stool bulk.  To make bowel movements more regular to prevent constipation.  To lower cholesterol.  To prevent overeating. WHEN IS THIS DIET USED?  It may be used if you have constipation and hemorrhoids.  It may be used if you have uncomplicated diverticulosis (intestine condition) and irritable bowel syndrome.  It may be used if you need help with weight management.  It may be used if you want to add it to your diet as a protective measure against atherosclerosis, diabetes, and cancer. SOURCES OF FIBER  Whole-grain breads and cereals.  Fruits, such as apples, oranges, bananas, berries, prunes, and pears.  Vegetables, such as green peas, carrots, sweet potatoes, beets, broccoli, cabbage, spinach, and artichokes.  Legumes, such split peas, soy, lentils.  Almonds. FIBER CONTENT IN FOODS Starches and Grains / Dietary Fiber (g)  Cheerios, 1 cup / 3 g  Corn Flakes cereal, 1 cup / 0.7 g  Rice crispy treat cereal, 1 cup / 0.3 g  Instant oatmeal (cooked),  cup / 2 g  Frosted wheat cereal, 1 cup / 5.1 g  Brown, long-grain rice (cooked), 1 cup / 3.5 g  White, long-grain rice (cooked), 1 cup / 0.6 g  Enriched macaroni (cooked), 1 cup / 2.5 g Legumes / Dietary Fiber (g)  Baked beans (canned, plain, or vegetarian),  cup /  5.2 g  Kidney beans (canned),  cup / 6.8 g  Pinto beans (cooked),  cup / 5.5 g Breads and Crackers / Dietary Fiber (g)  Plain or honey graham crackers, 2 squares / 0.7 g  Saltine crackers, 3 squares / 0.3 g  Plain, salted pretzels, 10 pieces / 1.8 g  Whole-wheat bread, 1 slice / 1.9 g  White bread, 1 slice / 0.7 g  Raisin bread, 1 slice / 1.2 g  Plain bagel, 3 oz / 2 g  Flour tortilla, 1 oz / 0.9 g  Corn tortilla, 1 small / 1.5 g  Hamburger or hotdog bun, 1 small / 0.9 g Fruits / Dietary Fiber (g)  Apple with skin, 1 medium / 4.4 g  Sweetened applesauce,  cup / 1.5 g  Banana,  medium / 1.5 g  Grapes, 10 grapes / 0.4 g  Orange, 1 small / 2.3 g  Raisin, 1.5 oz / 1.6 g  Melon, 1 cup / 1.4 g Vegetables / Dietary Fiber (g)  Green beans (canned),  cup / 1.3 g  Carrots (cooked),  cup / 2.3 g  Broccoli (cooked),  cup / 2.8 g  Peas (cooked),  cup / 4.4 g  Mashed potatoes,  cup / 1.6 g  Lettuce, 1 cup / 0.5 g  Corn (canned),  cup / 1.6 g  Tomato,  cup / 1.1 g Document Released: 07/01/2005 Document Revised: 12/31/2011 Document Reviewed: 10/03/2011 [employer] Patient Information  [address] [place].  Thank you for choosing me and Jeff [facility].  Venita Lick. Pleas Koch., MD., Clementeen Graham

## 2012-12-04 ENCOUNTER — Encounter: Payer: Self-pay | Admitting: Gastroenterology

## 2012-12-04 ENCOUNTER — Telehealth: Payer: Self-pay | Admitting: Gastroenterology

## 2012-12-04 NOTE — Telephone Encounter (Signed)
Error

## 2012-12-04 NOTE — Telephone Encounter (Signed)
Called patient and phone number is busy.

## 2012-12-04 NOTE — Telephone Encounter (Signed)
Called patient back and told him we left samples up front of Nexium for him to pick up.

## 2013-11-19 ENCOUNTER — Telehealth: Payer: Self-pay | Admitting: Gastroenterology

## 2013-11-19 MED ORDER — ESOMEPRAZOLE MAGNESIUM 40 MG PO CPDR: 40.0000 mg | DELAYED_RELEASE_CAPSULE | Freq: Every day | ORAL | Status: DC

## 2013-11-19 NOTE — Telephone Encounter (Signed)
Prescription sent to patient's pharmacy. Told patient to keep appt for any further refills.

## 2013-12-13 ENCOUNTER — Ambulatory Visit (INDEPENDENT_AMBULATORY_CARE_PROVIDER_SITE_OTHER): Payer: BC Managed Care – PPO | Admitting: Gastroenterology

## 2013-12-13 ENCOUNTER — Encounter: Payer: Self-pay | Admitting: Gastroenterology

## 2013-12-13 VITALS — BP 150/90 | HR 76 | Ht 72.0 in | Wt 202.6 lb

## 2013-12-13 DIAGNOSIS — K219 Gastro-esophageal reflux disease without esophagitis: Secondary | ICD-10-CM

## 2013-12-13 MED ORDER — ESOMEPRAZOLE MAGNESIUM 40 MG PO CPDR: 40.0000 mg | DELAYED_RELEASE_CAPSULE | Freq: Every day | ORAL | Status: DC

## 2013-12-13 NOTE — Patient Instructions (Signed)
We have sent the following medications to your pharmacy for you to pick up at your convenience: Generic Nexium 90 day supply.  Thank you for choosing me and [facility].  Pricilla Riffle. Dagoberto Ligas., MD., Marval Regal

## 2013-12-13 NOTE — Progress Notes (Signed)
    History of Present Illness: This is a 58 year old male with GERD and a history of an esophageal stricture. His reflux symptoms are under excellent control on Nexium 40 mg daily. He infrequently notes breakthrough symptoms related to certain dietary stressors.  Current Medications, Allergies, Past Medical History, Past Surgical History, Family History and Social History were reviewed in [employer] record.  Physical Exam: General: Well developed , well nourished, no acute distress Head: Normocephalic and atraumatic Eyes:  sclerae anicteric, EOMI Ears: Normal auditory acuity Mouth: No deformity or lesions Lungs: Clear throughout to auscultation Heart: Regular rate and rhythm; no murmurs, rubs or bruits Abdomen: Soft, non tender and non distended. No masses, hepatosplenomegaly or hernias noted. Normal Bowel sounds Musculoskeletal: Symmetrical with no gross deformities  Pulses:  Normal pulses noted Extremities: No clubbing, cyanosis, edema or deformities noted Neurological: Alert oriented x 4, grossly nonfocal Psychological:  Alert and cooperative. Normal mood and affect  Assessment and Recommendations:  1. GERD with a history of esophageal stricture. Continue standard antireflux measures and Nexium 40 mg daily.   2. Colorectal cancer screening, average risk. Screening colonoscopy in April 2018.

## 2014-01-11 ENCOUNTER — Other Ambulatory Visit: Payer: Self-pay

## 2014-01-11 MED ORDER — ESOMEPRAZOLE MAGNESIUM 40 MG PO CPDR: 40.0000 mg | DELAYED_RELEASE_CAPSULE | Freq: Every day | ORAL | Status: DC

## 2014-10-04 ENCOUNTER — Telehealth: Payer: Self-pay | Admitting: Gastroenterology

## 2014-10-05 NOTE — Telephone Encounter (Signed)
Patient states he wants to know when he is due for follow up. Informed patient he is due in June. Pt verbalized understanding.

## 2014-10-05 NOTE — Telephone Encounter (Signed)
LM for patient to return my call.

## 2014-11-11 ENCOUNTER — Ambulatory Visit: Payer: Self-pay | Admitting: Gastroenterology

## 2015-01-01 ENCOUNTER — Other Ambulatory Visit: Payer: Self-pay | Admitting: Gastroenterology

## 2015-02-01 ENCOUNTER — Encounter: Payer: Self-pay | Admitting: Gastroenterology

## 2015-02-01 ENCOUNTER — Ambulatory Visit (INDEPENDENT_AMBULATORY_CARE_PROVIDER_SITE_OTHER): Payer: BLUE CROSS/BLUE SHIELD | Admitting: Gastroenterology

## 2015-02-01 VITALS — BP 148/80 | HR 80 | Ht 72.0 in | Wt 197.0 lb

## 2015-02-01 DIAGNOSIS — Z8719 Personal history of other diseases of the digestive system: Secondary | ICD-10-CM | POA: Diagnosis not present

## 2015-02-01 DIAGNOSIS — K219 Gastro-esophageal reflux disease without esophagitis: Secondary | ICD-10-CM | POA: Diagnosis not present

## 2015-02-01 DIAGNOSIS — Z1211 Encounter for screening for malignant neoplasm of colon: Secondary | ICD-10-CM | POA: Diagnosis not present

## 2015-02-01 NOTE — Assessment & Plan Note (Signed)
GERD is well controlled. Renew Nexium 40 mg daily. REV in 1 year.

## 2015-02-01 NOTE — Patient Instructions (Addendum)
Have your pharmacy contact us when you run out of refills of Nexium.   Thank you for choosing me and [facility].  Pricilla Riffle. Dagoberto Ligas., MD., Marval Regal

## 2015-02-01 NOTE — Progress Notes (Signed)
    History of Present Illness: This is a 59 year old male with a history of GERD complicated by peptic stricture initially diagnosed and dilated in 2000. His reflux symptoms have remained under excellent control on Nexium 40 mg daily. He occasionally has a brief episode of heartburn related to spicy meal or eating late at night. Denies weight loss, abdominal pain, constipation, diarrhea, change in stool caliber, melena, hematochezia, nausea, vomiting, dysphagia, chest pain.  Current Medications, Allergies, Past Medical History, Past Surgical History, Family History and Social History were reviewed in [employer] record.  Physical Exam: General: Well developed , well nourished, no acute distress Head: Normocephalic and atraumatic Eyes:  sclerae anicteric, EOMI Ears: Normal auditory acuity Mouth: No deformity or lesions Lungs: Clear throughout to auscultation Heart: Regular rate and rhythm; no murmurs, rubs or bruits Abdomen: Soft, non tender and non distended. No masses, hepatosplenomegaly or hernias noted. Normal Bowel sounds Musculoskeletal: Symmetrical with no gross deformities  Pulses:  Normal pulses noted Extremities: No clubbing, cyanosis, edema or deformities noted Neurological: Alert oriented x 4, grossly nonfocal Psychological:  Alert and cooperative. Normal mood and affect  Assessment and Recommendations:

## 2015-02-01 NOTE — Assessment & Plan Note (Signed)
See plan for GERD.

## 2015-02-01 NOTE — Assessment & Plan Note (Signed)
10 year interval screening colonoscopy due April 2018.

## 2015-03-29 ENCOUNTER — Other Ambulatory Visit: Payer: Self-pay | Admitting: Gastroenterology

## 2015-12-18 ENCOUNTER — Other Ambulatory Visit: Payer: Self-pay | Admitting: Gastroenterology

## 2016-03-15 ENCOUNTER — Other Ambulatory Visit: Payer: Self-pay | Admitting: Gastroenterology

## 2016-04-30 ENCOUNTER — Ambulatory Visit (INDEPENDENT_AMBULATORY_CARE_PROVIDER_SITE_OTHER): Payer: BLUE CROSS/BLUE SHIELD | Admitting: Gastroenterology

## 2016-04-30 ENCOUNTER — Encounter: Payer: Self-pay | Admitting: Gastroenterology

## 2016-04-30 VITALS — BP 160/98 | HR 84 | Ht 72.0 in | Wt 199.0 lb

## 2016-04-30 DIAGNOSIS — Z1212 Encounter for screening for malignant neoplasm of rectum: Secondary | ICD-10-CM

## 2016-04-30 DIAGNOSIS — K219 Gastro-esophageal reflux disease without esophagitis: Secondary | ICD-10-CM | POA: Diagnosis not present

## 2016-04-30 DIAGNOSIS — Z1211 Encounter for screening for malignant neoplasm of colon: Secondary | ICD-10-CM

## 2016-04-30 DIAGNOSIS — R197 Diarrhea, unspecified: Secondary | ICD-10-CM | POA: Diagnosis not present

## 2016-04-30 MED ORDER — ESOMEPRAZOLE MAGNESIUM 40 MG PO CPDR: DELAYED_RELEASE_CAPSULE | ORAL | 3 refills | Status: DC

## 2016-04-30 NOTE — Patient Instructions (Addendum)
We have sent the following medications to your pharmacy for you to pick up at your convenience: esomeprazole.   You can start over the counter Zantac 150 mg one tablet by mouth at bedtime for reflux.   Thank you for choosing me and [facility].  Pricilla Riffle. Dagoberto Ligas., MD., Marval Regal   Normal BMI (Body Mass Index- based on height and weight) is between 19 and 25. Your BMI today is Body mass index is 26.99 kg/m. Marland Kitchen Please consider follow up  regarding your BMI with your Primary Care Provider.

## 2016-04-30 NOTE — Progress Notes (Signed)
    History of Present Illness: This is a 60 year old male with a history of GERD complicated by peptic stricture. Initial diagnosis and dilation in 2000. His reflux symptoms have been under excellent control without dysphagia. He relates occasional mild morning diarrhea for a couple years. He is not sure if it is related to certain foods from prior evening.   Current Medications, Allergies, Past Medical History, Past Surgical History, Family History and Social History were reviewed in [employer] record.  Physical Exam: General: Well developed, well nourished, no acute distress Head: Normocephalic and atraumatic Eyes:  sclerae anicteric, EOMI Ears: Normal auditory acuity Mouth: No deformity or lesions Lungs: Clear throughout to auscultation Heart: Regular rate and rhythm; no murmurs, rubs or bruits Abdomen: Soft, non tender and non distended. No masses, hepatosplenomegaly or hernias noted. Normal Bowel sounds Musculoskeletal: Symmetrical with no gross deformities  Pulses:  Normal pulses noted Extremities: No clubbing, cyanosis, edema or deformities noted Neurological: Alert oriented x 4, grossly nonfocal Psychological:  Alert and cooperative. Normal mood and affect  Assessment and Recommendations:  1. GERD with a history of a peptic stricture. Continue standard antireflux measures and esomeprazole 40 mg every morning.  2. CRC screening, average risk. A 10 year interval screening colonoscopy is due in April 2018.  3. Intermittent mild morning diarrhea. Advised to keep track of foods from prior evening when diarrhea occurs and contact us if symptoms worsen.   I spent 15 minutes of face-to-face time with the patient. Greater than 50% of the time was spent counseling and coordinating care.

## 2016-06-14 DIAGNOSIS — E291 Testicular hypofunction: Secondary | ICD-10-CM | POA: Diagnosis not present

## 2016-06-14 DIAGNOSIS — Z8546 Personal history of malignant neoplasm of prostate: Secondary | ICD-10-CM | POA: Diagnosis not present

## 2016-06-24 DIAGNOSIS — N393 Stress incontinence (female) (male): Secondary | ICD-10-CM | POA: Diagnosis not present

## 2016-06-24 DIAGNOSIS — Z8546 Personal history of malignant neoplasm of prostate: Secondary | ICD-10-CM | POA: Diagnosis not present

## 2016-06-24 DIAGNOSIS — E291 Testicular hypofunction: Secondary | ICD-10-CM | POA: Diagnosis not present

## 2016-06-24 DIAGNOSIS — N5231 Erectile dysfunction following radical prostatectomy: Secondary | ICD-10-CM | POA: Diagnosis not present

## 2016-08-23 ENCOUNTER — Encounter: Payer: Self-pay | Admitting: Gastroenterology

## 2016-09-27 DIAGNOSIS — M1812 Unilateral primary osteoarthritis of first carpometacarpal joint, left hand: Secondary | ICD-10-CM | POA: Diagnosis not present

## 2016-09-27 DIAGNOSIS — M1811 Unilateral primary osteoarthritis of first carpometacarpal joint, right hand: Secondary | ICD-10-CM | POA: Diagnosis not present

## 2016-11-07 ENCOUNTER — Encounter: Payer: Self-pay | Admitting: Gastroenterology

## 2016-12-13 DIAGNOSIS — E291 Testicular hypofunction: Secondary | ICD-10-CM | POA: Diagnosis not present

## 2016-12-13 DIAGNOSIS — Z8546 Personal history of malignant neoplasm of prostate: Secondary | ICD-10-CM | POA: Diagnosis not present

## 2016-12-23 DIAGNOSIS — N5231 Erectile dysfunction following radical prostatectomy: Secondary | ICD-10-CM | POA: Diagnosis not present

## 2016-12-23 DIAGNOSIS — N486 Induration penis plastica: Secondary | ICD-10-CM | POA: Diagnosis not present

## 2016-12-23 DIAGNOSIS — E291 Testicular hypofunction: Secondary | ICD-10-CM | POA: Diagnosis not present

## 2016-12-23 DIAGNOSIS — N393 Stress incontinence (female) (male): Secondary | ICD-10-CM | POA: Diagnosis not present

## 2016-12-23 DIAGNOSIS — Z8546 Personal history of malignant neoplasm of prostate: Secondary | ICD-10-CM | POA: Diagnosis not present

## 2017-01-06 ENCOUNTER — Ambulatory Visit (AMBULATORY_SURGERY_CENTER): Payer: Self-pay | Admitting: *Deleted

## 2017-01-06 VITALS — Ht 72.0 in | Wt 191.6 lb

## 2017-01-06 DIAGNOSIS — Z1211 Encounter for screening for malignant neoplasm of colon: Secondary | ICD-10-CM

## 2017-01-06 MED ORDER — NA SULFATE-K SULFATE-MG SULF 17.5-3.13-1.6 GM/177ML PO SOLN: 1.0000 | Freq: Once | ORAL | 0 refills | Status: AC

## 2017-01-06 NOTE — Progress Notes (Signed)
Denies allergies to eggs or soy products. Denies complications with sedation or anesthesia. Denies O2 use. Denies use of diet or weight loss medications.  Emmi instructions given for colonoscopy.  

## 2017-01-10 ENCOUNTER — Encounter: Payer: Self-pay | Admitting: Gastroenterology

## 2017-01-27 ENCOUNTER — Telehealth: Payer: Self-pay | Admitting: Gastroenterology

## 2017-01-27 ENCOUNTER — Ambulatory Visit (AMBULATORY_SURGERY_CENTER): Payer: BLUE CROSS/BLUE SHIELD | Admitting: Gastroenterology

## 2017-01-27 ENCOUNTER — Encounter: Payer: Self-pay | Admitting: Gastroenterology

## 2017-01-27 VITALS — BP 118/85 | HR 68 | Temp 98.0°F | Resp 14 | Ht 72.0 in | Wt 191.0 lb

## 2017-01-27 DIAGNOSIS — Z1211 Encounter for screening for malignant neoplasm of colon: Secondary | ICD-10-CM | POA: Diagnosis present

## 2017-01-27 DIAGNOSIS — Z1212 Encounter for screening for malignant neoplasm of rectum: Secondary | ICD-10-CM

## 2017-01-27 DIAGNOSIS — D124 Benign neoplasm of descending colon: Secondary | ICD-10-CM | POA: Diagnosis not present

## 2017-01-27 MED ORDER — SODIUM CHLORIDE 0.9 % IV SOLN: 500.0000 mL | INTRAVENOUS | Status: AC

## 2017-01-27 NOTE — Telephone Encounter (Signed)
Patient just called back. Temp=98.8. He is still shaking with chills. Told patient I would call him back.

## 2017-01-27 NOTE — Patient Instructions (Signed)
Colon polyp x 1 removed today. Handouts given on polyps,diverticulosis, and hemorrhoids.  Result letter in your mail in 2-3 weeks. Resume current medications. Call us with any questions or concerns. Thank you!!   YOU HAD AN ENDOSCOPIC PROCEDURE TODAY AT [facility]:   Refer to the procedure report that was given to you for any specific questions about what was found during the examination.  If the procedure report does not answer your questions, please call your gastroenterologist to clarify.  If you requested that your care partner not be given the details of your procedure findings, then the procedure report has been included in a sealed envelope for you to review at your convenience later.  YOU SHOULD EXPECT: Some feelings of bloating in the abdomen. Passage of more gas than usual.  Walking can help get rid of the air that was put into your GI tract during the procedure and reduce the bloating. If you had a lower endoscopy (such as a colonoscopy or flexible sigmoidoscopy) you may notice spotting of blood in your stool or on the toilet paper. If you underwent a bowel prep for your procedure, you may not have a normal bowel movement for a few days.  Please Note:  You might notice some irritation and congestion in your nose or some drainage.  This is from the oxygen used during your procedure.  There is no need for concern and it should clear up in a day or so.  SYMPTOMS TO REPORT IMMEDIATELY:   Following lower endoscopy (colonoscopy or flexible sigmoidoscopy):  Excessive amounts of blood in the stool  Significant tenderness or worsening of abdominal pains  Swelling of the abdomen that is new, acute  Fever of 100F or higher   For urgent or emergent issues, a gastroenterologist can be reached at any hour by calling [phone].   DIET:  We do recommend a small meal at first, but then you may proceed to your regular diet.  Drink plenty of fluids but you should avoid  alcoholic beverages for 24 hours.  ACTIVITY:  You should plan to take it easy for the rest of today and you should NOT DRIVE or use heavy machinery until tomorrow (because of the sedation medicines used during the test).    FOLLOW UP: Our staff will call the number listed on your records the next business day following your procedure to check on you and address any questions or concerns that you may have regarding the information given to you following your procedure. If we do not reach you, we will leave a message.  However, if you are feeling well and you are not experiencing any problems, there is no need to return our call.  We will assume that you have returned to your regular daily activities without incident.  If any biopsies were taken you will be contacted by phone or by letter within the next 1-3 weeks.  Please call us at [phone] if you have not heard about the biopsies in 3 weeks.    SIGNATURES/CONFIDENTIALITY: You and/or your care partner have signed paperwork which will be entered into your electronic medical record.  These signatures attest to the fact that that the information above on your After Visit Summary has been reviewed and is understood.  Full responsibility of the confidentiality of this discharge information lies with you and/or your care-partner.

## 2017-01-27 NOTE — Progress Notes (Signed)
Called to room to assist during endoscopic procedure.  Patient ID and intended procedure confirmed with present staff. Received instructions for my participation in the procedure from the performing physician.  

## 2017-01-27 NOTE — Telephone Encounter (Signed)
Spoke with patient. He states his shaking is better but he is still wrapped up in blankets. Explained to patient Dr.Stark's recommendations. Patient verbalizes understanding. He states he will go ahead and start taking the Tylenol. Encouraged patient to call us at any time if needed. Made patient aware of 24 hour number.

## 2017-01-27 NOTE — Telephone Encounter (Signed)
Had uneventful colonoscopy with cold snare of 1 small polyp earlier today.  Take Tylenol at max dose as directed on bottle for symptoms Continue to monitor symptoms and temperature Call for persistent symptoms, new symptoms or T > 100.4

## 2017-01-27 NOTE — Addendum Note (Signed)
Addended by: Evonnie Pat A on: 01/27/2017 11:30 AM   Modules accepted: Orders

## 2017-01-27 NOTE — Progress Notes (Signed)
Report given to PACU, vss 

## 2017-01-27 NOTE — Telephone Encounter (Signed)
Spoke with patient. He states he started having chills and shaking at 1230 when he got home after colonoscopy. He did eat and drink without any problems. Denies any of these symptoms before colonoscopy. Patient denies any pain or vomiting. He states he took a Zantac about 1130 am today. Patient has not taken his temp. Today. He states he just had something to drink. Encouraged patient to go a head and take this when he can. Please advise, Robbin.

## 2017-01-27 NOTE — Progress Notes (Signed)
Encouraged patient and wife that he should get sleep study test done per CRNA. He may call his PCP to request that.

## 2017-01-27 NOTE — Op Note (Signed)
[facility] Patient Name: Jeff Jenkins Procedure Date: 01/27/2017 8:26 AM MRN: [id number] Endoscopist: Ladene Artist , MD Age: 61 Referring MD:  Date of Birth: 19-Sep-1955 Gender: Male Account #: [id number] Procedure:                Colonoscopy Indications:              Screening for colorectal malignant neoplasm Medicines:                Monitored Anesthesia Care Procedure:                Pre-Anesthesia Assessment:                           - Prior to the procedure, a History and Physical                            was performed, and patient medications and                            allergies were reviewed. The patient's tolerance of                            previous anesthesia was also reviewed. The risks                            and benefits of the procedure and the sedation                            options and risks were discussed with the patient.                            All questions were answered, and informed consent                            was obtained. Prior Anticoagulants: The patient has                            taken no previous anticoagulant or antiplatelet                            agents. ASA Grade Assessment: II - A patient with                            mild systemic disease. After reviewing the risks                            and benefits, the patient was deemed in                            satisfactory condition to undergo the procedure.                           After obtaining informed consent, the colonoscope  was passed under direct vision. Throughout the                            procedure, the patient's blood pressure, pulse, and                            oxygen saturations were monitored continuously. The                            Colonoscope was introduced through the anus and                            advanced to the the cecum, identified by                            appendiceal orifice and  ileocecal valve. The                            ileocecal valve, appendiceal orifice, and rectum                            were photographed. The quality of the bowel                            preparation was good. The colonoscopy was performed                            without difficulty. The patient tolerated the                            procedure well. Scope In: 8:38:10 AM Scope Out: 8:53:13 AM Scope Withdrawal Time: 0 hours 11 minutes 20 seconds  Total Procedure Duration: 0 hours 15 minutes 3 seconds  Findings:                 The perianal and digital rectal examinations were                            normal.                           A 5 mm polyp was found in the descending colon. The                            polyp was sessile. The polyp was removed with a                            cold snare. Resection and retrieval were complete.                           Multiple small-mouthed diverticula were found in                            the left colon. There was no evidence of  diverticular bleeding.                           Internal hemorrhoids were found during                            retroflexion. The hemorrhoids were small and Grade                            I (internal hemorrhoids that do not prolapse).                           The exam was otherwise without abnormality on                            direct and retroflexion views. Complications:            No immediate complications. Estimated blood loss:                            None. Estimated Blood Loss:     Estimated blood loss: none. Impression:               - One 5 mm polyp in the descending colon, removed                            with a cold snare. Resected and retrieved.                           - Mild diverticulosis in the left colon. There was                            no evidence of diverticular bleeding.                           - Internal hemorrhoids.                            - The examination was otherwise normal on direct                            and retroflexion views. Recommendation:           - Repeat colonoscopy in 5 years for surveillance if                            polyp is precancerous, otherwise 10 years.                           - Patient has a contact number available for                            emergencies. The signs and symptoms of potential                            delayed complications were discussed with the  patient. Return to normal activities tomorrow.                            Written discharge instructions were provided to the                            patient.                           - Resume previous diet.                           - Continue present medications.                           - Await pathology results. Ladene Artist, MD 01/27/2017 8:56:11 AM This report has been signed electronically.

## 2017-01-28 ENCOUNTER — Telehealth: Payer: Self-pay | Admitting: *Deleted

## 2017-01-28 NOTE — Telephone Encounter (Signed)
  Follow up Call-  Call back number 01/27/2017  Post procedure Call Back phone  # [phone]  Permission to leave phone message Yes  Some recent data might be hidden     Patient questions:  Do you have a fever, pain , or abdominal swelling? No. Pain Score  0 *  Have you tolerated food without any problems? Yes.    Have you been able to return to your normal activities? Yes.    Do you have any questions about your discharge instructions: Diet   No. Medications  No. Follow up visit  No.  Do you have questions or concerns about your Care? No.  Actions: * If pain score is 4 or above: No action needed, pain <4.  Pt. Had shaking and chills on yesterday and temperature but at 2200 last night temperature broke and temp was 98.8,pt. Stated that he feels good today,instructed pt. If he has any problems to please call us at the contact number given to him on discharge instructions,he verbalize understanding.

## 2017-02-04 ENCOUNTER — Encounter: Payer: Self-pay | Admitting: Gastroenterology

## 2017-03-13 DIAGNOSIS — M1812 Unilateral primary osteoarthritis of first carpometacarpal joint, left hand: Secondary | ICD-10-CM | POA: Diagnosis not present

## 2017-03-13 DIAGNOSIS — M180 Bilateral primary osteoarthritis of first carpometacarpal joints: Secondary | ICD-10-CM | POA: Diagnosis not present

## 2017-03-13 DIAGNOSIS — M1811 Unilateral primary osteoarthritis of first carpometacarpal joint, right hand: Secondary | ICD-10-CM | POA: Diagnosis not present

## 2017-04-13 ENCOUNTER — Other Ambulatory Visit: Payer: Self-pay | Admitting: Gastroenterology

## 2017-04-28 DIAGNOSIS — H00025 Hordeolum internum left lower eyelid: Secondary | ICD-10-CM | POA: Diagnosis not present

## 2017-06-27 ENCOUNTER — Other Ambulatory Visit: Payer: Self-pay | Admitting: Gastroenterology

## 2017-06-30 DIAGNOSIS — Z8546 Personal history of malignant neoplasm of prostate: Secondary | ICD-10-CM | POA: Diagnosis not present

## 2017-06-30 DIAGNOSIS — E291 Testicular hypofunction: Secondary | ICD-10-CM | POA: Diagnosis not present

## 2017-07-02 DIAGNOSIS — N5231 Erectile dysfunction following radical prostatectomy: Secondary | ICD-10-CM | POA: Diagnosis not present

## 2017-07-02 DIAGNOSIS — N393 Stress incontinence (female) (male): Secondary | ICD-10-CM | POA: Diagnosis not present

## 2017-07-02 DIAGNOSIS — E291 Testicular hypofunction: Secondary | ICD-10-CM | POA: Diagnosis not present

## 2017-07-02 DIAGNOSIS — Z8546 Personal history of malignant neoplasm of prostate: Secondary | ICD-10-CM | POA: Diagnosis not present

## 2017-07-29 DIAGNOSIS — M25522 Pain in left elbow: Secondary | ICD-10-CM | POA: Diagnosis not present

## 2017-07-29 DIAGNOSIS — M7702 Medial epicondylitis, left elbow: Secondary | ICD-10-CM | POA: Diagnosis not present

## 2017-09-19 ENCOUNTER — Other Ambulatory Visit: Payer: Self-pay

## 2017-09-19 MED ORDER — ESOMEPRAZOLE MAGNESIUM 40 MG PO CPDR: DELAYED_RELEASE_CAPSULE | ORAL | 0 refills | Status: DC

## 2017-10-02 DIAGNOSIS — M25522 Pain in left elbow: Secondary | ICD-10-CM | POA: Diagnosis not present

## 2017-10-02 DIAGNOSIS — M7702 Medial epicondylitis, left elbow: Secondary | ICD-10-CM | POA: Diagnosis not present

## 2017-10-20 DIAGNOSIS — M25551 Pain in right hip: Secondary | ICD-10-CM | POA: Diagnosis not present

## 2017-10-20 DIAGNOSIS — M545 Low back pain: Secondary | ICD-10-CM | POA: Diagnosis not present

## 2017-11-04 DIAGNOSIS — M47817 Spondylosis without myelopathy or radiculopathy, lumbosacral region: Secondary | ICD-10-CM | POA: Diagnosis not present

## 2017-11-04 DIAGNOSIS — M545 Low back pain: Secondary | ICD-10-CM | POA: Diagnosis not present

## 2017-11-04 DIAGNOSIS — M25551 Pain in right hip: Secondary | ICD-10-CM | POA: Diagnosis not present

## 2017-11-10 DIAGNOSIS — M545 Low back pain: Secondary | ICD-10-CM | POA: Diagnosis not present

## 2017-11-14 DIAGNOSIS — M47817 Spondylosis without myelopathy or radiculopathy, lumbosacral region: Secondary | ICD-10-CM | POA: Diagnosis not present

## 2017-11-14 DIAGNOSIS — M545 Low back pain: Secondary | ICD-10-CM | POA: Diagnosis not present

## 2017-11-17 DIAGNOSIS — E291 Testicular hypofunction: Secondary | ICD-10-CM | POA: Diagnosis not present

## 2017-11-17 DIAGNOSIS — N281 Cyst of kidney, acquired: Secondary | ICD-10-CM | POA: Diagnosis not present

## 2017-12-15 ENCOUNTER — Encounter (INDEPENDENT_AMBULATORY_CARE_PROVIDER_SITE_OTHER): Payer: Self-pay

## 2017-12-15 ENCOUNTER — Ambulatory Visit (INDEPENDENT_AMBULATORY_CARE_PROVIDER_SITE_OTHER): Payer: BLUE CROSS/BLUE SHIELD | Admitting: Gastroenterology

## 2017-12-15 ENCOUNTER — Encounter: Payer: Self-pay | Admitting: Gastroenterology

## 2017-12-15 VITALS — BP 134/80 | HR 88 | Ht 72.0 in | Wt 189.8 lb

## 2017-12-15 DIAGNOSIS — Z1212 Encounter for screening for malignant neoplasm of rectum: Secondary | ICD-10-CM | POA: Diagnosis not present

## 2017-12-15 DIAGNOSIS — K219 Gastro-esophageal reflux disease without esophagitis: Secondary | ICD-10-CM | POA: Diagnosis not present

## 2017-12-15 DIAGNOSIS — Z1211 Encounter for screening for malignant neoplasm of colon: Secondary | ICD-10-CM

## 2017-12-15 MED ORDER — ESOMEPRAZOLE MAGNESIUM 40 MG PO CPDR: DELAYED_RELEASE_CAPSULE | ORAL | 3 refills | Status: DC

## 2017-12-15 NOTE — Progress Notes (Signed)
    History of Present Illness: This is a 62 year old male with a history of GERD and a peptic stricture.  Symptoms under very good control on esomeprazole 40 mg daily.  He relates that his reflux symptoms remain under very good control.  The only difficulties he notes are when he eats spicy foods or eats late at night.  He had a low-grade fever and chills following his colonoscopy last summer.  His symptoms resolved spontaneously.  Current Medications, Allergies, Past Medical History, Past Surgical History, Family History and Social History were reviewed in [employer] record.  Physical Exam: General: Well developed, well nourished, no acute distress Head: Normocephalic and atraumatic Eyes:  sclerae anicteric, EOMI Ears: Normal auditory acuity Mouth: No deformity or lesions Lungs: Clear throughout to auscultation Heart: Regular rate and rhythm; no murmurs, rubs or bruits Abdomen: Soft, non tender and non distended. No masses, hepatosplenomegaly or hernias noted. Normal Bowel sounds Rectal: Not done Musculoskeletal: Symmetrical with no gross deformities  Pulses:  Normal pulses noted Extremities: No clubbing, cyanosis, edema or deformities noted Neurological: Alert oriented x 4, grossly nonfocal Psychological:  Alert and cooperative. Normal mood and affect  Assessment and Recommendations:  1. GERD with a history of a peptic stricture.  Follow standard antireflux measures.  Continue esomeprazole 40 mg daily.  2. CRC screening, average risk.  A 10-year interval screening colonoscopy is recommended in July 2028.  The transient fevers and chills he developed several hours following colonoscopy do not appear to be an anesthesia reaction.

## 2017-12-15 NOTE — Patient Instructions (Signed)
We have sent the following medications to your pharmacy for you to pick up at your convenience: Nexium.   Normal BMI (Body Mass Index- based on height and weight) is between 19 and 25. Your BMI today is Body mass index is 25.74 kg/m. Marland Kitchen Please consider follow up  regarding your BMI with your Primary Care Provider.  Thank you for choosing me and [facility].  Pricilla Riffle. Dagoberto Ligas., MD., Marval Regal

## 2017-12-16 DIAGNOSIS — M545 Low back pain: Secondary | ICD-10-CM | POA: Diagnosis not present

## 2017-12-16 DIAGNOSIS — M47817 Spondylosis without myelopathy or radiculopathy, lumbosacral region: Secondary | ICD-10-CM | POA: Diagnosis not present

## 2017-12-16 DIAGNOSIS — M25562 Pain in left knee: Secondary | ICD-10-CM | POA: Diagnosis not present

## 2017-12-23 DIAGNOSIS — Z8546 Personal history of malignant neoplasm of prostate: Secondary | ICD-10-CM | POA: Diagnosis not present

## 2017-12-23 DIAGNOSIS — E291 Testicular hypofunction: Secondary | ICD-10-CM | POA: Diagnosis not present

## 2017-12-31 DIAGNOSIS — E291 Testicular hypofunction: Secondary | ICD-10-CM | POA: Diagnosis not present

## 2017-12-31 DIAGNOSIS — N393 Stress incontinence (female) (male): Secondary | ICD-10-CM | POA: Diagnosis not present

## 2017-12-31 DIAGNOSIS — Z8546 Personal history of malignant neoplasm of prostate: Secondary | ICD-10-CM | POA: Diagnosis not present

## 2017-12-31 DIAGNOSIS — N281 Cyst of kidney, acquired: Secondary | ICD-10-CM | POA: Diagnosis not present

## 2018-01-26 DIAGNOSIS — Z125 Encounter for screening for malignant neoplasm of prostate: Secondary | ICD-10-CM | POA: Diagnosis not present

## 2018-01-26 DIAGNOSIS — Z0000 Encounter for general adult medical examination without abnormal findings: Secondary | ICD-10-CM | POA: Diagnosis not present

## 2018-02-03 DIAGNOSIS — R5383 Other fatigue: Secondary | ICD-10-CM | POA: Diagnosis not present

## 2018-02-03 DIAGNOSIS — M545 Low back pain: Secondary | ICD-10-CM | POA: Diagnosis not present

## 2018-02-03 DIAGNOSIS — J309 Allergic rhinitis, unspecified: Secondary | ICD-10-CM | POA: Diagnosis not present

## 2018-02-03 DIAGNOSIS — M5417 Radiculopathy, lumbosacral region: Secondary | ICD-10-CM | POA: Diagnosis not present

## 2018-02-03 DIAGNOSIS — K219 Gastro-esophageal reflux disease without esophagitis: Secondary | ICD-10-CM | POA: Diagnosis not present

## 2018-02-03 DIAGNOSIS — R209 Unspecified disturbances of skin sensation: Secondary | ICD-10-CM | POA: Diagnosis not present

## 2018-02-03 DIAGNOSIS — Z0000 Encounter for general adult medical examination without abnormal findings: Secondary | ICD-10-CM | POA: Diagnosis not present

## 2018-02-05 DIAGNOSIS — E119 Type 2 diabetes mellitus without complications: Secondary | ICD-10-CM | POA: Diagnosis not present

## 2018-02-05 DIAGNOSIS — M545 Low back pain: Secondary | ICD-10-CM | POA: Diagnosis not present

## 2018-02-13 DIAGNOSIS — M1812 Unilateral primary osteoarthritis of first carpometacarpal joint, left hand: Secondary | ICD-10-CM | POA: Diagnosis not present

## 2018-02-13 DIAGNOSIS — M13841 Other specified arthritis, right hand: Secondary | ICD-10-CM | POA: Diagnosis not present

## 2018-03-09 DIAGNOSIS — J302 Other seasonal allergic rhinitis: Secondary | ICD-10-CM | POA: Diagnosis not present

## 2018-03-09 DIAGNOSIS — Z77122 Contact with and (suspected) exposure to noise: Secondary | ICD-10-CM | POA: Diagnosis not present

## 2018-03-09 DIAGNOSIS — H903 Sensorineural hearing loss, bilateral: Secondary | ICD-10-CM | POA: Diagnosis not present

## 2018-03-09 DIAGNOSIS — H9313 Tinnitus, bilateral: Secondary | ICD-10-CM | POA: Diagnosis not present

## 2018-03-10 DIAGNOSIS — M5136 Other intervertebral disc degeneration, lumbar region: Secondary | ICD-10-CM | POA: Diagnosis not present

## 2018-04-30 DIAGNOSIS — M5136 Other intervertebral disc degeneration, lumbar region: Secondary | ICD-10-CM | POA: Diagnosis not present

## 2018-05-01 DIAGNOSIS — E119 Type 2 diabetes mellitus without complications: Secondary | ICD-10-CM | POA: Diagnosis not present

## 2018-05-01 DIAGNOSIS — M25511 Pain in right shoulder: Secondary | ICD-10-CM | POA: Diagnosis not present

## 2018-05-01 DIAGNOSIS — R5383 Other fatigue: Secondary | ICD-10-CM | POA: Diagnosis not present

## 2018-05-01 DIAGNOSIS — W57XXXA Bitten or stung by nonvenomous insect and other nonvenomous arthropods, initial encounter: Secondary | ICD-10-CM | POA: Diagnosis not present

## 2018-05-11 DIAGNOSIS — Z23 Encounter for immunization: Secondary | ICD-10-CM | POA: Diagnosis not present

## 2018-05-11 DIAGNOSIS — E119 Type 2 diabetes mellitus without complications: Secondary | ICD-10-CM | POA: Diagnosis not present

## 2018-05-12 DIAGNOSIS — M25511 Pain in right shoulder: Secondary | ICD-10-CM | POA: Diagnosis not present

## 2018-06-04 DIAGNOSIS — M25561 Pain in right knee: Secondary | ICD-10-CM | POA: Diagnosis not present

## 2018-06-25 DIAGNOSIS — E291 Testicular hypofunction: Secondary | ICD-10-CM | POA: Diagnosis not present

## 2018-06-25 DIAGNOSIS — Z8546 Personal history of malignant neoplasm of prostate: Secondary | ICD-10-CM | POA: Diagnosis not present

## 2018-06-29 DIAGNOSIS — M25561 Pain in right knee: Secondary | ICD-10-CM | POA: Diagnosis not present

## 2018-07-01 DIAGNOSIS — N5231 Erectile dysfunction following radical prostatectomy: Secondary | ICD-10-CM | POA: Diagnosis not present

## 2018-07-01 DIAGNOSIS — E230 Hypopituitarism: Secondary | ICD-10-CM | POA: Diagnosis not present

## 2018-07-01 DIAGNOSIS — Z8546 Personal history of malignant neoplasm of prostate: Secondary | ICD-10-CM | POA: Diagnosis not present

## 2018-07-01 DIAGNOSIS — N393 Stress incontinence (female) (male): Secondary | ICD-10-CM | POA: Diagnosis not present

## 2018-07-07 DIAGNOSIS — S83231D Complex tear of medial meniscus, current injury, right knee, subsequent encounter: Secondary | ICD-10-CM | POA: Diagnosis not present

## 2018-07-07 DIAGNOSIS — M238X1 Other internal derangements of right knee: Secondary | ICD-10-CM | POA: Diagnosis not present

## 2018-08-03 DIAGNOSIS — E119 Type 2 diabetes mellitus without complications: Secondary | ICD-10-CM | POA: Diagnosis not present

## 2018-08-03 DIAGNOSIS — R634 Abnormal weight loss: Secondary | ICD-10-CM | POA: Diagnosis not present

## 2018-08-03 DIAGNOSIS — I10 Essential (primary) hypertension: Secondary | ICD-10-CM | POA: Diagnosis not present

## 2018-09-02 DIAGNOSIS — M25571 Pain in right ankle and joints of right foot: Secondary | ICD-10-CM | POA: Diagnosis not present

## 2018-09-02 DIAGNOSIS — M170 Bilateral primary osteoarthritis of knee: Secondary | ICD-10-CM | POA: Diagnosis not present

## 2018-11-18 DIAGNOSIS — M170 Bilateral primary osteoarthritis of knee: Secondary | ICD-10-CM | POA: Diagnosis not present

## 2018-11-18 DIAGNOSIS — M1711 Unilateral primary osteoarthritis, right knee: Secondary | ICD-10-CM | POA: Diagnosis not present

## 2018-11-18 DIAGNOSIS — S83231D Complex tear of medial meniscus, current injury, right knee, subsequent encounter: Secondary | ICD-10-CM | POA: Diagnosis not present

## 2018-11-18 DIAGNOSIS — M1712 Unilateral primary osteoarthritis, left knee: Secondary | ICD-10-CM | POA: Diagnosis not present

## 2018-11-18 DIAGNOSIS — M25562 Pain in left knee: Secondary | ICD-10-CM | POA: Diagnosis not present

## 2018-12-18 DIAGNOSIS — J309 Allergic rhinitis, unspecified: Secondary | ICD-10-CM | POA: Diagnosis not present

## 2018-12-21 DIAGNOSIS — E119 Type 2 diabetes mellitus without complications: Secondary | ICD-10-CM | POA: Diagnosis not present

## 2018-12-21 DIAGNOSIS — I10 Essential (primary) hypertension: Secondary | ICD-10-CM | POA: Diagnosis not present

## 2019-01-04 DIAGNOSIS — E291 Testicular hypofunction: Secondary | ICD-10-CM | POA: Diagnosis not present

## 2019-01-04 DIAGNOSIS — Z8546 Personal history of malignant neoplasm of prostate: Secondary | ICD-10-CM | POA: Diagnosis not present

## 2019-01-11 ENCOUNTER — Other Ambulatory Visit: Payer: Self-pay | Admitting: Urology

## 2019-01-11 DIAGNOSIS — J301 Allergic rhinitis due to pollen: Secondary | ICD-10-CM | POA: Diagnosis not present

## 2019-01-11 DIAGNOSIS — J3089 Other allergic rhinitis: Secondary | ICD-10-CM | POA: Diagnosis not present

## 2019-01-11 DIAGNOSIS — N393 Stress incontinence (female) (male): Secondary | ICD-10-CM | POA: Diagnosis not present

## 2019-01-11 DIAGNOSIS — E291 Testicular hypofunction: Secondary | ICD-10-CM | POA: Diagnosis not present

## 2019-01-11 DIAGNOSIS — N281 Cyst of kidney, acquired: Secondary | ICD-10-CM

## 2019-01-11 DIAGNOSIS — R05 Cough: Secondary | ICD-10-CM | POA: Diagnosis not present

## 2019-01-11 DIAGNOSIS — Z8546 Personal history of malignant neoplasm of prostate: Secondary | ICD-10-CM | POA: Diagnosis not present

## 2019-01-11 DIAGNOSIS — N5231 Erectile dysfunction following radical prostatectomy: Secondary | ICD-10-CM | POA: Diagnosis not present

## 2019-01-14 ENCOUNTER — Ambulatory Visit (HOSPITAL_COMMUNITY)
Admission: RE | Admit: 2019-01-14 | Discharge: 2019-01-14 | Disposition: A | Discharge status: Home / Self Care | Payer: BC Managed Care – PPO | Source: Ambulatory Visit | Attending: Urology | Admitting: Urology

## 2019-01-14 ENCOUNTER — Other Ambulatory Visit: Payer: Self-pay

## 2019-01-14 DIAGNOSIS — N281 Cyst of kidney, acquired: Secondary | ICD-10-CM | POA: Diagnosis not present

## 2019-01-14 DIAGNOSIS — K760 Fatty (change of) liver, not elsewhere classified: Secondary | ICD-10-CM | POA: Diagnosis not present

## 2019-01-14 LAB — POCT I-STAT CREATININE: Creatinine, Ser: 0.9 mg/dL (ref 0.61–1.24)

## 2019-01-14 MED ORDER — GADOBUTROL 1 MMOL/ML IV SOLN
9.0000 mL | Freq: Once | INTRAVENOUS | Status: AC | PRN
  Administered 2019-01-14: 8 mL via INTRAVENOUS

## 2019-02-08 DIAGNOSIS — Z0000 Encounter for general adult medical examination without abnormal findings: Secondary | ICD-10-CM | POA: Diagnosis not present

## 2019-02-08 DIAGNOSIS — Z1159 Encounter for screening for other viral diseases: Secondary | ICD-10-CM | POA: Diagnosis not present

## 2019-02-08 DIAGNOSIS — Z125 Encounter for screening for malignant neoplasm of prostate: Secondary | ICD-10-CM | POA: Diagnosis not present

## 2019-02-08 DIAGNOSIS — E118 Type 2 diabetes mellitus with unspecified complications: Secondary | ICD-10-CM | POA: Diagnosis not present

## 2019-02-11 DIAGNOSIS — Z23 Encounter for immunization: Secondary | ICD-10-CM | POA: Diagnosis not present

## 2019-02-11 DIAGNOSIS — R05 Cough: Secondary | ICD-10-CM | POA: Diagnosis not present

## 2019-02-11 DIAGNOSIS — E119 Type 2 diabetes mellitus without complications: Secondary | ICD-10-CM | POA: Diagnosis not present

## 2019-02-15 DIAGNOSIS — S83231A Complex tear of medial meniscus, current injury, right knee, initial encounter: Secondary | ICD-10-CM | POA: Diagnosis not present

## 2019-02-15 DIAGNOSIS — M94262 Chondromalacia, left knee: Secondary | ICD-10-CM | POA: Diagnosis not present

## 2019-02-15 DIAGNOSIS — M94261 Chondromalacia, right knee: Secondary | ICD-10-CM | POA: Diagnosis not present

## 2019-02-22 DIAGNOSIS — M25561 Pain in right knee: Secondary | ICD-10-CM | POA: Diagnosis not present

## 2019-02-25 ENCOUNTER — Other Ambulatory Visit: Payer: Self-pay

## 2019-02-25 MED ORDER — ESOMEPRAZOLE MAGNESIUM 40 MG PO CPDR: DELAYED_RELEASE_CAPSULE | ORAL | 0 refills | Status: DC

## 2019-03-01 ENCOUNTER — Encounter: Payer: Self-pay | Admitting: Gastroenterology

## 2019-03-01 ENCOUNTER — Ambulatory Visit: Payer: BC Managed Care – PPO | Admitting: Gastroenterology

## 2019-03-01 VITALS — BP 138/84 | HR 95 | Temp 98.0°F | Ht 72.0 in | Wt 182.4 lb

## 2019-03-01 DIAGNOSIS — K219 Gastro-esophageal reflux disease without esophagitis: Secondary | ICD-10-CM | POA: Diagnosis not present

## 2019-03-01 MED ORDER — ESOMEPRAZOLE MAGNESIUM 40 MG PO CPDR: DELAYED_RELEASE_CAPSULE | ORAL | 3 refills | Status: DC

## 2019-03-01 NOTE — Patient Instructions (Signed)
We have sent the following prescriptions to your mail in pharmacy: Nexium.   If you have not heard from your mail in pharmacy within 1 week or if you have not received your medication in the mail, please contact us at [phone] so we may find out why.  You can take over the counter Tums and Pepcid AC at bedtime as needed.   Thank you for choosing me and [facility].  Pricilla Riffle. Dagoberto Ligas., MD., Marval Regal

## 2019-03-01 NOTE — Progress Notes (Signed)
    History of Present Illness: This is a 63 year old male returning for follow-up of GERD.  He has a history of an esophageal stricture dilated in 2000.  He has occasional breakthrough symptoms mainly at night and mainly regurgitation.  He clearly notes these are associated with eating spicy foods or eating too late at night.  He has occasional breakthrough symptoms on other occasions.  The symptoms are stable and have not increased in severity over the past year. Denies weight loss, abdominal pain, constipation, diarrhea, change in stool caliber, melena, hematochezia, nausea, vomiting, dysphagia, chest pain.   Current Medications, Allergies, Past Medical History, Past Surgical History, Family History and Social History were reviewed in [employer] record.   Physical Exam: General: Well developed, well nourished, no acute distress Head: Normocephalic and atraumatic Eyes:  sclerae anicteric, EOMI Ears: Normal auditory acuity Mouth: No deformity or lesions Lungs: Clear throughout to auscultation Heart: Regular rate and rhythm; no murmurs, rubs or bruits Abdomen: Soft, non tender and non distended. No masses, hepatosplenomegaly or hernias noted. Normal Bowel sounds Rectal: Not done Musculoskeletal: Symmetrical with no gross deformities  Pulses:  Normal pulses noted Extremities: No clubbing, cyanosis, edema or deformities noted Neurological: Alert oriented x 4, grossly nonfocal Psychological:  Alert and cooperative. Normal mood and affect   Assessment and Recommendations:  1. GERD with occasional breakthrough symptoms, often nocturnal.  History of esophageal stricture in 2000.  Continue Nexium 40 mg p.o. every morning.  Follow antireflux measures.  If nighttime symptoms persist elevate head of bed 4 inches.  Pepid ac hs prn. TUMS prn. REV in 1 year.   2. CRC screening, average risk.  A 10-year interval screening colonoscopy recommended in July 2028.

## 2019-03-02 ENCOUNTER — Other Ambulatory Visit: Payer: Self-pay

## 2019-03-02 MED ORDER — ESOMEPRAZOLE MAGNESIUM 40 MG PO CPDR: DELAYED_RELEASE_CAPSULE | ORAL | 3 refills | Status: DC

## 2019-03-25 ENCOUNTER — Ambulatory Visit: Payer: BC Managed Care – PPO | Admitting: Cardiology

## 2019-03-25 ENCOUNTER — Encounter: Payer: Self-pay | Admitting: Cardiology

## 2019-03-25 ENCOUNTER — Other Ambulatory Visit: Payer: Self-pay

## 2019-03-25 VITALS — BP 164/102 | HR 90 | Temp 98.1°F | Ht 72.0 in | Wt 184.0 lb

## 2019-03-25 DIAGNOSIS — R072 Precordial pain: Secondary | ICD-10-CM | POA: Diagnosis not present

## 2019-03-25 DIAGNOSIS — R030 Elevated blood-pressure reading, without diagnosis of hypertension: Secondary | ICD-10-CM

## 2019-03-25 DIAGNOSIS — I493 Ventricular premature depolarization: Secondary | ICD-10-CM | POA: Diagnosis not present

## 2019-03-25 DIAGNOSIS — E119 Type 2 diabetes mellitus without complications: Secondary | ICD-10-CM | POA: Diagnosis not present

## 2019-03-25 DIAGNOSIS — E7800 Pure hypercholesterolemia, unspecified: Secondary | ICD-10-CM | POA: Diagnosis not present

## 2019-03-25 DIAGNOSIS — Z01812 Encounter for preprocedural laboratory examination: Secondary | ICD-10-CM

## 2019-03-25 MED ORDER — METOPROLOL TARTRATE 100 MG PO TABS: ORAL_TABLET | ORAL | 0 refills | Status: DC

## 2019-03-25 NOTE — Patient Instructions (Addendum)
Medication Instructions:  Your physician recommends that you continue on your current medications as directed. Please refer to the Current Medication list given to you today.  If you need a refill on your cardiac medications before your next appointment, please call your pharmacy.   Lab work: BMET 1 week prior to procedure.  If you have labs (blood work) drawn today and your tests are completely normal, you will receive your results only by: [facility] (if you have MyChart) OR A paper copy in the mail If you have any lab test that is abnormal or we need to change your treatment, we will call you to review the results.  Testing/Procedures: Your physician has requested that you have cardiac CT. Cardiac computed tomography (CT) is a painless test that uses an x-ray machine to take clear, detailed pictures of your heart. For further information please visit [email or web address]. Please follow instruction sheet as given.  Your physician has recommended that you wear a 3 DAY ZIO-PATCH monitor. The Zio patch cardiac monitor continuously records heart rhythm data for up to 14 days, this is for patients being evaluated for multiple types heart rhythms. For the first 24 hours post application, please avoid getting the Zio monitor wet in the shower or by excessive sweating during exercise. After that, feel free to carry on with regular activities. Keep soaps and lotions away from the ZIO XT Patch.  This will be placed at our [facility] location - [address].           Follow-Up: At [facility], you and your health needs are our priority.  As part of our continuing mission to provide you with exceptional heart care, we have created designated Provider Care Teams.  These Care Teams include your primary Cardiologist (physician) and Advanced Practice Providers (APPs -  Physician Assistants and Nurse Practitioners) who all work together to provide you with the care you need, when you  need it. You will need a follow up appointment in 6 weeks.  Please call our office 2 months in advance to schedule this appointment.  You may see Dr. Harrell Gave or one of the following Advanced Practice Providers on your designated Care Team:   Rosaria Ferries, PA-C Jory Sims, DNP, ANP   .Your cardiac CT will be scheduled at one of the below locations:   [facility] [address] [place], [place] [address] [phone]    If scheduled at [facility], please arrive at the [facility] main entrance of [facility] 30-45 minutes prior to test start time. Proceed to the [facility] Radiology Department (first floor) to check-in and test prep.  If scheduled at [facility], please arrive 15 mins early for check-in and test prep.  Please follow these instructions carefully (unless otherwise directed):  Hold all erectile dysfunction medications at least 5 days prior to test.  On the Night Before the Test: . Be sure to Drink plenty of water. . Do not consume any caffeinated/decaffeinated beverages or chocolate 12 hours prior to your test. . Do not take any antihistamines 12 hours prior to your test.   On the Day of the Test: . Drink plenty of water. Do not drink any water within one hour of the test. . Do not eat any food 4 hours prior to the test. . You may take your regular medications prior to the test.  . Take metoprolol (Lopressor) two hours prior to test.  After the Test: . Drink plenty of water. . After receiving IV contrast, you may experience a mild flushed feeling. This is normal. . On occasion, you may experience a mild rash up to 24 hours after the test. This is not dangerous. If this occurs, you can take Benadryl 25 mg and increase your fluid intake. . If you experience trouble breathing, this can be serious. If it is severe call 911 IMMEDIATELY. If it is mild, please call our office. . If you take any  of these medications: Glipizide/Metformin, Avandament, Glucavance, please do not take 48 hours after completing test unless otherwise instructed.    Please contact the cardiac imaging nurse navigator should you have any questions/concerns Marchia Bond, RN Navigator [facility]iac [facility] [phone] Office  [phone] Cell

## 2019-03-25 NOTE — Progress Notes (Signed)
Cardiology Office Note:    Date:  03/25/2019   ID:  SWADE PENNIMAN, DOB Apr 21, 1956, MRN [id number]  PCP:  Deland Pretty, MD  Cardiologist:  Buford Dresser, MD PhD  Referring MD: Deland Pretty, MD   CC: new patient consult for evaluation of CV risk and symptoms  History of Present Illness:    SHREYASH MASIN is a 63 y.o. male with a hx of allergies, type II diabetes lifestyle controlled who is seen as a new consult at the request of Deland Pretty, MD for the evaluation and management of elevated cardiovascular risk.  Patient concerns: lost mother to his heart attack suddenly at age 2 (had evidence of old MI that was unknown). Lost his brother to a massive stroke earlier this year at age 83. Daughter is a CV nurse in the hospital, wants to make sure he gets checked out.  Cardiovascular risk factors: Prior clinical ASCVD: none that he knows of Comorbid conditions: has been watching BP, home BP measurements are fine, elevated in the office. Denies diagnosis of hypertension, last BP at GI office 138/84. No formal hyperlipidemia, saw calcium on CXR, started rosuvastatin August of this year. Monitoring for diabetes, borderline, though prior A1c was 7.4 (diagnostic) but treating with diet and lifestyle. No chronic kidney disease. Metabolic syndrome/Obesity: none Chronic inflammatory conditions: none Tobacco use history: never Family history: lost mother to his heart attack suddenly at age 54 (had evidence of old MI that was unknown). Lost his brother to a massive stroke earlier this year at age 46. Daughter is a CV nurse in the hospital, wants to make sure he gets checked. Prior cardiac testing and/or incidental findings on other testing (ie coronary calcium): none Exercise level: limited by knee pain, but can mow yard, walk, etc. Doesn't sit still Current diet: eggs, [place] bacon, venison sausage. Eats some fried food. Not a lot of fruit, some veggies.  Does endorse intermittent chest  pain. It is central to right sided, grabbing in nature. Occurs every few weeks. Severe enough that it makes him stop what he is doing. No radiation, no other associated symptoms.   Denies shortness of breath at rest or with normal exertion. Has shortness of breath in spring/fall, sees allergist, improving with treatment. No PND, orthopnea, LE edema or unexpected weight gain. No syncope or palpitations.  Past Medical History:  Diagnosis Date  . Arthritis   . Broken ankle 2008  . Diverticulosis   . ED (erectile dysfunction)   . Facial fracture ([place]) 1979  . GERD (gastroesophageal reflux disease)   . Hearing loss in right ear   . Hemorrhoids   . Hiatal hernia   . Peptic stricture of esophagus   . Peptic ulcer disease 1989  . Prostate cancer ([place]) 02/2009  . Seasonal allergies     Past Surgical History:  Procedure Laterality Date  . ANKLE SURGERY Left 2008  . INGUINAL HERNIA REPAIR Left   . KNEE ARTHROSCOPY Left 2013  . PROSTATECTOMY  2011  . VASECTOMY      Current Medications: Current Outpatient Medications on File Prior to Visit  Medication Sig  . acetaminophen (TYLENOL) 325 MG tablet Take 650 mg by mouth every 6 (six) hours as needed for moderate pain.  Marland Kitchen aspirin EC 81 MG tablet Take 81 mg by mouth daily.  Marland Kitchen aspirin-acetaminophen-caffeine (EXCEDRIN MIGRAINE) 250-250-65 MG tablet Take by mouth every 6 (six) hours as needed for headache.  Hinda Kehr 30 MG/ACT SOLN   . CIALIS 5 MG tablet  Take 5 mg by mouth daily.   . diphenhydrAMINE (BENADRYL) 25 MG tablet Take 25 mg by mouth daily as needed.    Marland Kitchen esomeprazole (NEXIUM) 40 MG capsule TAKE 1 CAPSULE BY MOUTH EVERY DAY BEFORE BREAKFAST  . gabapentin (NEURONTIN) 300 MG capsule TAKE 1 CAPSULE BY MOUTH EVERY DAY AS DIRECTED-may take up to three times daily  . ipratropium (ATROVENT) 0.03 % nasal spray Place 2 sprays into both nostrils 2 (two) times daily.  Marland Kitchen levocetirizine (XYZAL) 5 MG tablet Take 5 mg by mouth every evening.  .  rosuvastatin (CRESTOR) 20 MG tablet Take 20 mg by mouth daily.   No current facility-administered medications on file prior to visit.      Allergies:   Patient has no known allergies.   Social History   Socioeconomic History  . Marital status: Married    Spouse name: Not on file  . Number of children: 2  . Years of education: Not on file  . Highest education level: Not on file  Occupational History  . Occupation: Geographical information systems officer  . Financial resource strain: Not on file  . Food insecurity    Worry: Not on file    Inability: Not on file  . Transportation needs    Medical: Not on file    Non-medical: Not on file  Tobacco Use  . Smoking status: Never Smoker  . Smokeless tobacco: Never Used  Substance and Sexual Activity  . Alcohol use: Yes    Alcohol/week: 12.0 standard drinks    Types: 12 Standard drinks or equivalent per week    Comment: social   . Drug use: No  . Sexual activity: Yes    Partners: Female  Lifestyle  . Physical activity    Days per week: Not on file    Minutes per session: Not on file  . Stress: Not on file  Relationships  . Social Herbalist on phone: Not on file    Gets together: Not on file    Attends religious service: Not on file    Active member of club or organization: Not on file    Attends meetings of clubs or organizations: Not on file    Relationship status: Not on file  Other Topics Concern  . Not on file  Social History Narrative  . Not on file     Family History: The patient's family history includes Alcohol abuse in his father; Breast cancer in his mother; Diabetes in his mother; Heart attack in his mother; Stroke in his brother. There is no history of Colon cancer, Esophageal cancer, Rectal cancer, or Stomach cancer.  ROS:   Please see the history of present illness.  Additional pertinent ROS: Constitutional: Negative for chills, fever, night sweats. Had unintentional weight loss on berberine, stopped.   HENT:  Negative for ear pain and hearing loss.   Eyes: Negative for loss of vision and eye pain.  Respiratory: Negative for cough, sputum, wheezing.   Cardiovascular: See HPI. Gastrointestinal: Negative for abdominal pain, melena, and hematochezia.  Genitourinary: Negative for dysuria and hematuria.  Musculoskeletal: Negative for falls and myalgias.  Skin: Negative for itching and rash.  Neurological: Negative for focal weakness, focal sensory changes and loss of consciousness.  Endo/Heme/Allergies: Does not bruise/bleed easily.     EKGs/Labs/Other Studies Reviewed:    The following studies were reviewed today: Notes from Dr. Pennie Banter office  EKG:  EKG is personally reviewed.  The ekg ordered today demonstrates SR with  PVCs--two morphologies, and 2 PVCs are consecutive  Recent Labs: 01/14/2019: Creatinine, Ser 0.90  Recent Lipid Panel No results found for: CHOL, TRIG, HDL, CHOLHDL, VLDL, LDLCALC, LDLDIRECT  Physical Exam:    VS:  BP (!) 164/102   Pulse 90   Temp 98.1 F (36.7 C)   Ht 6' (1.829 m)   Wt 184 lb (83.5 kg)   SpO2 96%   BMI 24.95 kg/m     Wt Readings from Last 3 Encounters:  03/25/19 184 lb (83.5 kg)  03/01/19 182 lb 6 oz (82.7 kg)  12/15/17 189 lb 12.8 oz (86.1 kg)    GEN: Well nourished, well developed in no acute distress HEENT: Normal, moist mucous membranes NECK: No JVD CARDIAC: regular rhythm, normal S1 and S2, no murmurs, rubs, gallops.  VASCULAR: Radial and DP pulses 2+ bilaterally. No carotid bruits RESPIRATORY:  Clear to auscultation without rales, wheezing or rhonchi  ABDOMEN: Soft, non-tender, non-distended MUSCULOSKELETAL:  Ambulates independently SKIN: Warm and dry, no edema NEUROLOGIC:  Alert and oriented x 3. No focal neuro deficits noted. PSYCHIATRIC:  Normal affect    ASSESSMENT:    1. PVC's (premature ventricular contractions)   2. Precordial pain   3. Pre-procedure lab exam   4. Type 2 diabetes mellitus without complication, without  long-term current use of insulin (HCC)   5. Elevated blood pressure reading in office with white coat syndrome, without diagnosis of hypertension   6. Pure hypercholesterolemia    PLAN:    Chest pain and high CV risk: while chest pain has some atypical components, his ASCVD risk score is very elevated, placing him at high risk for CV events. -discussed CT coronary angiography. Discussed pros and cons, including but not limited to false positive/false negative risk, radiation risk, and risk of IV contrast dye. Based on shared decision making, decision was made to pursue CT coronary angiography. -will give one time dose of metoprolol -counseled on need to get BMET one week prior to test -counseled on use of sublingual nitroglycerin and its importance to a good test -counseled on high risk symptoms that need immediate medical attention  PVCs: concerning pattern on ECG today. 3 PVCs, two different morphologies, and two are sequential. Discussed that high PVC burden can affect cardiac function in the long term. Also concerning for possible silent ischemia -Zio monitor, 3 day, for PVC burden -if high burden, would order echo  Cardiac risk counseling and prevention recommendations: -recommend heart healthy/Mediterranean diet, with whole grains, fruits, vegetable, fish, lean meats, nuts, and olive oil. Limit salt. -recommend moderate walking, 3-5 times/week for 30-50 minutes each session. Aim for at least 150 minutes.week. Goal should be pace of 3 miles/hours, or walking 1.5 miles in 30 minutes -recommend avoidance of tobacco products. Avoid excess alcohol. -Additional risk factor control:  -Diabetes: I cannot see his entire A1c history, but he endorses A1c at one time of 7.4, diagnostic of type II diabetes. He believes he just had repeat A1c today  -Lipids: From 02/08/19 (prior to statin): Tchol 181, TG 102, HDL 37, LDL 124. Just started statin. Would allow for 8-12 weeks of statin use to optimally  determine effect on hyperlipidemia.   -Blood pressure control: elevated today, endorses history of white coat syndrome with normal home BP readings.  -Weight: BMI <25, normal range -ASCVD risk score: 32.3% with today's BP and Lipids form 02/08/19  Plan for follow up: 6 weeks or sooner PRN.  Medication Adjustments/Labs and Tests Ordered: Current medicines are reviewed at length  with the patient today.  Concerns regarding medicines are outlined above.  Orders Placed This Encounter  Procedures  . CT CORONARY MORPH W/CTA COR W/SCORE W/CA W/CM &/OR WO/CM  . CT CORONARY FRACTIONAL FLOW RESERVE DATA PREP  . CT CORONARY FRACTIONAL FLOW RESERVE FLUID ANALYSIS  . Basic metabolic panel  . LONG TERM MONITOR (3-14 DAYS)  . EKG 12-Lead   Meds ordered this encounter  Medications  . metoprolol tartrate (LOPRESSOR) 100 MG tablet    Sig: TAKE 1 TABLET 2 HR PRIOR TO CARDIAC PROCEDURE    Dispense:  1 tablet    Refill:  0    Patient Instructions  Medication Instructions:  Your physician recommends that you continue on your current medications as directed. Please refer to the Current Medication list given to you today.  If you need a refill on your cardiac medications before your next appointment, please call your pharmacy.   Lab work: BMET 1 week prior to procedure.  If you have labs (blood work) drawn today and your tests are completely normal, you will receive your results only by: [place] (if you have MyChart) OR A paper copy in the mail If you have any lab test that is abnormal or we need to change your treatment, we will call you to review the results.  Testing/Procedures: Your physician has requested that you have cardiac CT. Cardiac computed tomography (CT) is a painless test that uses an x-ray machine to take clear, detailed pictures of your heart. For further information please visit [email or web address].tn. Please follow instruction sheet as given.  Your physician has recommended  that you wear a 3 DAY ZIO-PATCH monitor. The Zio patch cardiac monitor continuously records heart rhythm data for up to 14 days, this is for patients being evaluated for multiple types heart rhythms. For the first 24 hours post application, please avoid getting the Zio monitor wet in the shower or by excessive sweating during exercise. After that, feel free to carry on with regular activities. Keep soaps and lotions away from the ZIO XT Patch.  This will be placed at our [facility] location - [address], [address].           Follow-Up: At [facility], you and your health needs are our priority.  As part of our continuing mission to provide you with exceptional heart care, we have created designated Provider Care Teams.  These Care Teams include your primary Cardiologist (physician) and Advanced Practice Providers (APPs -  Physician Assistants and Nurse Practitioners) who all work together to provide you with the care you need, when you need it. You will need a follow up appointment in 6 weeks.  Please call our office 2 months in advance to schedule this appointment.  You may see Dr. Harrell Gave or one of the following Advanced Practice Providers on your designated Care Team:   Rosaria Ferries, PA-C Jory Sims, DNP, ANP   .Your cardiac CT will be scheduled at one of the below locations:   [facility] [address] [place], [place] [address] [phone]    If scheduled at [facility], please arrive at the [facility] main entrance of [facility] 30-45 minutes prior to test start time. Proceed to the [facility] Radiology Department (first floor) to check-in and test prep.  If scheduled at [facility], please arrive 15 mins early for check-in and test prep.  Please follow these instructions carefully (unless otherwise directed):  Hold all  erectile dysfunction medications at least 5 days prior to test.  On the Night  Before the Test: . Be sure to Drink plenty of water. . Do not consume any caffeinated/decaffeinated beverages or chocolate 12 hours prior to your test. . Do not take any antihistamines 12 hours prior to your test.   On the Day of the Test: . Drink plenty of water. Do not drink any water within one hour of the test. . Do not eat any food 4 hours prior to the test. . You may take your regular medications prior to the test.  . Take metoprolol (Lopressor) two hours prior to test.        After the Test: . Drink plenty of water. . After receiving IV contrast, you may experience a mild flushed feeling. This is normal. . On occasion, you may experience a mild rash up to 24 hours after the test. This is not dangerous. If this occurs, you can take Benadryl 25 mg and increase your fluid intake. . If you experience trouble breathing, this can be serious. If it is severe call 911 IMMEDIATELY. If it is mild, please call our office. . If you take any of these medications: Glipizide/Metformin, Avandament, Glucavance, please do not take 48 hours after completing test unless otherwise instructed.    Please contact the cardiac imaging nurse navigator should you have any questions/concerns Marchia Bond, RN Navigator Cardiac Imaging [facility] [phone] Office  [phone] Cell          Signed, Buford Dresser, MD PhD 03/25/2019 7:17 PM    [place]

## 2019-03-29 DIAGNOSIS — I10 Essential (primary) hypertension: Secondary | ICD-10-CM | POA: Diagnosis not present

## 2019-03-29 DIAGNOSIS — E119 Type 2 diabetes mellitus without complications: Secondary | ICD-10-CM | POA: Diagnosis not present

## 2019-03-29 DIAGNOSIS — E7800 Pure hypercholesterolemia, unspecified: Secondary | ICD-10-CM | POA: Diagnosis not present

## 2019-03-29 DIAGNOSIS — Z23 Encounter for immunization: Secondary | ICD-10-CM | POA: Diagnosis not present

## 2019-04-06 ENCOUNTER — Telehealth: Payer: Self-pay | Admitting: *Deleted

## 2019-04-06 NOTE — Telephone Encounter (Signed)
Please call [phone] to set up your 14 day ZIO XT long term holter monitor patch for Dr. Harrell Gave.

## 2019-04-07 DIAGNOSIS — M25561 Pain in right knee: Secondary | ICD-10-CM | POA: Diagnosis not present

## 2019-04-07 DIAGNOSIS — M1712 Unilateral primary osteoarthritis, left knee: Secondary | ICD-10-CM | POA: Diagnosis not present

## 2019-04-07 NOTE — Telephone Encounter (Signed)
14 day ZIO XT Long term holter monitor to be mailed to the patients home.  Instructions reviewed briefly as they are included in the monitor kit.

## 2019-04-07 NOTE — Telephone Encounter (Signed)
Patient is returning call.  °

## 2019-04-13 ENCOUNTER — Ambulatory Visit: Payer: BC Managed Care – PPO | Admitting: Cardiology

## 2019-04-13 ENCOUNTER — Telehealth: Payer: Self-pay

## 2019-04-13 ENCOUNTER — Ambulatory Visit (INDEPENDENT_AMBULATORY_CARE_PROVIDER_SITE_OTHER): Payer: BC Managed Care – PPO

## 2019-04-13 DIAGNOSIS — Z23 Encounter for immunization: Secondary | ICD-10-CM | POA: Diagnosis not present

## 2019-04-13 DIAGNOSIS — I493 Ventricular premature depolarization: Secondary | ICD-10-CM

## 2019-04-13 DIAGNOSIS — R072 Precordial pain: Secondary | ICD-10-CM

## 2019-04-13 DIAGNOSIS — I10 Essential (primary) hypertension: Secondary | ICD-10-CM | POA: Diagnosis not present

## 2019-04-13 DIAGNOSIS — E119 Type 2 diabetes mellitus without complications: Secondary | ICD-10-CM | POA: Diagnosis not present

## 2019-04-13 NOTE — Telephone Encounter (Signed)
Spoke with pt's daughter who report pt's pcp started him on Lisinopril 5 mg today.She report home BP has been running around 130 to low 140's /80. Daughter requesting MD's recommendation.

## 2019-04-15 NOTE — Telephone Encounter (Signed)
With permission from pt, daughter updated and voiced understanding.

## 2019-04-15 NOTE — Telephone Encounter (Signed)
I agree with the start and dose of lisinopril as ordered by PCP. I would continue to check home BP to see how it responds. Thanks.

## 2019-04-28 ENCOUNTER — Encounter: Admit: 2019-04-28 | Discharge: 2019-04-28 | Payer: Private Health Insurance - Indemnity

## 2019-04-28 DIAGNOSIS — Z01812 Encounter for preprocedural laboratory examination: Secondary | ICD-10-CM | POA: Diagnosis not present

## 2019-04-28 LAB — BASIC METABOLIC PANEL
BUN/Creatinine Ratio: 14 (ref 10–24)
BUN: 11 mg/dL (ref 8–27)
CO2: 25 mmol/L (ref 20–29)
Calcium: 9.8 mg/dL (ref 8.6–10.2)
Chloride: 99 mmol/L (ref 96–106)
Creatinine, Ser: 0.78 mg/dL (ref 0.76–1.27)
GFR calc Af Amer: 111 mL/min/{1.73_m2} (ref >59)
GFR calc non Af Amer: 96 mL/min/{1.73_m2} (ref >59)
Glucose: 214 mg/dL — ABNORMAL HIGH (ref 65–99)
Potassium: 4.9 mmol/L (ref 3.5–5.2)
Sodium: 137 mmol/L (ref 134–144)

## 2019-05-03 ENCOUNTER — Telehealth (HOSPITAL_COMMUNITY): Payer: Self-pay | Admitting: Emergency Medicine

## 2019-05-03 ENCOUNTER — Telehealth: Payer: Self-pay

## 2019-05-03 NOTE — Telephone Encounter (Signed)
Spoke with pt's daughter who report pt has been experiencing really bad leg cramps that's waking him up throughout the night. She report symptoms started about a week ago and were only mild but has since progressed. Daughter inquiring if symptoms could be related to Crestor.   Will route to MD

## 2019-05-03 NOTE — Telephone Encounter (Signed)
Daughter updated and voiced understanding.  

## 2019-05-03 NOTE — Telephone Encounter (Signed)
It could be, though there are other things that can cause this as well. I would stop the crestor and give it 4 weeks, and then we can reassess. Thanks.

## 2019-05-03 NOTE — Telephone Encounter (Signed)
Left message on voicemail with name and callback number Germany Chelf RN Navigator Cardiac Imaging [facility] [phone] Office [phone] Cell  

## 2019-05-04 ENCOUNTER — Telehealth (HOSPITAL_COMMUNITY): Payer: Self-pay | Admitting: Emergency Medicine

## 2019-05-04 DIAGNOSIS — I10 Essential (primary) hypertension: Secondary | ICD-10-CM | POA: Diagnosis not present

## 2019-05-04 DIAGNOSIS — E119 Type 2 diabetes mellitus without complications: Secondary | ICD-10-CM | POA: Diagnosis not present

## 2019-05-04 NOTE — Telephone Encounter (Signed)
Pt calling ragarding CTA instructions, states he last took cialis on Monday. Told patient to refrain from taking it until after CTA was completed (appt wed 10/21)  Clarise Cruz

## 2019-05-05 ENCOUNTER — Other Ambulatory Visit: Payer: Self-pay

## 2019-05-05 ENCOUNTER — Ambulatory Visit (HOSPITAL_COMMUNITY)
Admission: RE | Admit: 2019-05-05 | Discharge: 2019-05-05 | Disposition: A | Discharge status: Home / Self Care | Payer: BC Managed Care – PPO | Source: Ambulatory Visit | Attending: Cardiology | Admitting: Cardiology

## 2019-05-05 DIAGNOSIS — I493 Ventricular premature depolarization: Secondary | ICD-10-CM

## 2019-05-05 DIAGNOSIS — R072 Precordial pain: Secondary | ICD-10-CM

## 2019-05-05 MED ORDER — IOHEXOL 350 MG/ML SOLN: 80.0000 mL | Freq: Once | INTRAVENOUS | Status: DC | PRN

## 2019-05-05 NOTE — Progress Notes (Signed)
Patient stated he took cialis 2 days ago.  Notified Dr. Harrell Gave of this.  Test is cancelled for today.  Patient notified of cancellation and to not take Cialis for at least 4 days before his rescheduled test.  Pt stated he understood.  Pt brought to front rad check in desk to reschedule exam

## 2019-05-10 ENCOUNTER — Ambulatory Visit: Payer: BC Managed Care – PPO | Admitting: Cardiology

## 2019-05-14 ENCOUNTER — Other Ambulatory Visit: Payer: Self-pay

## 2019-05-14 ENCOUNTER — Ambulatory Visit: Payer: BC Managed Care – PPO | Admitting: Cardiology

## 2019-05-14 MED ORDER — METOPROLOL TARTRATE 100 MG PO TABS: ORAL_TABLET | ORAL | 0 refills | Status: DC

## 2019-05-17 ENCOUNTER — Telehealth (HOSPITAL_COMMUNITY): Payer: Self-pay | Admitting: Emergency Medicine

## 2019-05-17 NOTE — Telephone Encounter (Signed)
Left message on voicemail with name and callback number Brittain Smithey RN Navigator Cardiac Imaging [place] [phone] Office [phone] Cell  

## 2019-05-18 ENCOUNTER — Other Ambulatory Visit: Payer: Self-pay

## 2019-05-18 ENCOUNTER — Ambulatory Visit (HOSPITAL_COMMUNITY)
Admission: RE | Admit: 2019-05-18 | Discharge: 2019-05-18 | Disposition: A | Discharge status: Home / Self Care | Payer: BC Managed Care – PPO | Source: Ambulatory Visit | Attending: Cardiology | Admitting: Cardiology

## 2019-05-18 DIAGNOSIS — I493 Ventricular premature depolarization: Secondary | ICD-10-CM | POA: Diagnosis not present

## 2019-05-18 DIAGNOSIS — R072 Precordial pain: Secondary | ICD-10-CM | POA: Insufficient documentation

## 2019-05-18 MED ORDER — NITROGLYCERIN 0.4 MG SL SUBL
0.8000 mg | SUBLINGUAL_TABLET | Freq: Once | SUBLINGUAL | Status: AC
  Administered 2019-05-18: 10:00:00 0.8 mg via SUBLINGUAL

## 2019-05-18 MED ORDER — IOHEXOL 350 MG/ML SOLN
80.0000 mL | Freq: Once | INTRAVENOUS | Status: AC | PRN
  Administered 2019-05-18: 10:00:00 80 mL via INTRAVENOUS

## 2019-05-18 MED ORDER — NITROGLYCERIN 0.4 MG SL SUBL
SUBLINGUAL_TABLET | SUBLINGUAL | Status: AC
  Filled 2019-05-18: qty 2

## 2019-05-18 NOTE — Progress Notes (Signed)
CT scan completed. Tolerated well. D/C home walking. Awake and alert. In no distress. 

## 2019-05-20 ENCOUNTER — Encounter: Payer: Self-pay | Admitting: Cardiology

## 2019-05-20 ENCOUNTER — Ambulatory Visit (INDEPENDENT_AMBULATORY_CARE_PROVIDER_SITE_OTHER): Payer: BC Managed Care – PPO | Admitting: Cardiology

## 2019-05-20 ENCOUNTER — Other Ambulatory Visit: Payer: Self-pay

## 2019-05-20 VITALS — BP 124/66 | HR 87 | Ht 72.0 in | Wt 180.0 lb

## 2019-05-20 DIAGNOSIS — Z7189 Other specified counseling: Secondary | ICD-10-CM

## 2019-05-20 DIAGNOSIS — Z712 Person consulting for explanation of examination or test findings: Secondary | ICD-10-CM

## 2019-05-20 DIAGNOSIS — E119 Type 2 diabetes mellitus without complications: Secondary | ICD-10-CM | POA: Diagnosis not present

## 2019-05-20 DIAGNOSIS — I2583 Coronary atherosclerosis due to lipid rich plaque: Secondary | ICD-10-CM

## 2019-05-20 DIAGNOSIS — M1712 Unilateral primary osteoarthritis, left knee: Secondary | ICD-10-CM | POA: Diagnosis not present

## 2019-05-20 DIAGNOSIS — M25561 Pain in right knee: Secondary | ICD-10-CM | POA: Diagnosis not present

## 2019-05-20 DIAGNOSIS — I2510 Atherosclerotic heart disease of native coronary artery without angina pectoris: Secondary | ICD-10-CM | POA: Insufficient documentation

## 2019-05-20 NOTE — Patient Instructions (Addendum)
Medication Instructions:  Can try coenzyme Q10 with statin as well as vitamin D to see if this helps you tolerate the rosuvastatin (crestor). Over the counter doses are fine.  *If you need a refill on your cardiac medications before your next appointment, please call your pharmacy*  Lab Work: None  Testing/Procedures: None  Follow-Up: At [facility], you and your health needs are our priority.  As part of our continuing mission to provide you with exceptional heart care, we have created designated Provider Care Teams.  These Care Teams include your primary Cardiologist (physician) and Advanced Practice Providers (APPs -  Physician Assistants and Nurse Practitioners) who all work together to provide you with the care you need, when you need it.  Your next appointment:   3 months  The format for your next appointment:   In Person  Provider:   Buford Dresser, MD

## 2019-05-20 NOTE — Progress Notes (Signed)
Cardiology Office Note:    Date:  05/20/2019   ID:  Jeff Jenkins, DOB 28-Dec-1955, MRN [id number]  PCP:  Deland Pretty, MD  Cardiologist:  Buford Dresser, MD PhD  Referring MD: Deland Pretty, MD   CC: follow up test results  History of Present Illness:    Jeff Jenkins is a 63 y.o. male with a hx of allergies, type II diabetes lifestyle controlled who is seen for follow up today. He was initially seen 04/04/19 as a new consult at the request of Deland Pretty, MD for the evaluation and management of elevated cardiovascular risk.  CV history/risk factors: Strong family history: lost mother to his heart attack suddenly at age 95 (had evidence of old MI that was unknown). Lost his brother to a massive stroke earlier this year at age 45.   Calcium noted on CXR 02/2019, started on rosuvastatin but recently stopped due to myalgia. Risk factors of type II diabetes. CT cardiac done 05/18/19, CAD noted with high calcium score, reviewed below.  Today: Did have one episode of right sided pain, went away on its own after 30 seconds. Otherwise no further chest pain since our visit.  We discussed results of his CT and monitor today at length over speakerphone with his daughter. In the office, we reviewed his actual CT images and monitor report together. Very high calcium score, with diffuse calcium in all three coronary vessels. There is also evidence of lipid rich plaque. Most concerning is a lipid rich lesion in the proximal LAD with between 50-69% stenosis.  Stopped crestor two weeks ago due to muscle cramps. Still with occasional cramps, but has knee pain and getting injections today. Primary cramping was in the groin area, and this has gotten better with stopping crestor. We discussed rechallenge with rosuvastatin and trial of coenzyme Q10 and/or vitamin D with it as well.  Reviewed monitor results. PVCs were occasional (<3%), with some couplets/triplets and one potential 4 beat run. No  sustained ectopy. One brief SVT episode as well, only a few beats. No high risk findings.  Denies shortness of breath at rest or with normal exertion. No PND, orthopnea, LE edema or unexpected weight gain. No syncope.  Past Medical History:  Diagnosis Date  . Arthritis   . Broken ankle 2008  . Diverticulosis   . ED (erectile dysfunction)   . Facial fracture ([place]) 1979  . GERD (gastroesophageal reflux disease)   . Hearing loss in right ear   . Hemorrhoids   . Hiatal hernia   . Peptic stricture of esophagus   . Peptic ulcer disease 1989  . Prostate cancer ([place]) 02/2009  . Seasonal allergies     Past Surgical History:  Procedure Laterality Date  . ANKLE SURGERY Left 2008  . INGUINAL HERNIA REPAIR Left   . KNEE ARTHROSCOPY Left 2013  . PROSTATECTOMY  2011  . VASECTOMY      Current Medications: Current Outpatient Medications on File Prior to Visit  Medication Sig  . acetaminophen (TYLENOL) 325 MG tablet Take 650 mg by mouth every 6 (six) hours as needed for moderate pain.  Marland Kitchen aspirin EC 81 MG tablet Take 81 mg by mouth daily.  Marland Kitchen aspirin-acetaminophen-caffeine (EXCEDRIN MIGRAINE) 250-250-65 MG tablet Take by mouth every 6 (six) hours as needed for headache.  Hinda Kehr 30 MG/ACT SOLN   . azelastine (ASTELIN) 0.1 % nasal spray Place 1 spray into both nostrils 2 (two) times daily. Use in each nostril as directed  . CIALIS  5 MG tablet Take 5 mg by mouth daily.   . diphenhydrAMINE (BENADRYL) 25 MG tablet Take 25 mg by mouth daily as needed.    Marland Kitchen esomeprazole (NEXIUM) 40 MG capsule TAKE 1 CAPSULE BY MOUTH EVERY DAY BEFORE BREAKFAST  . gabapentin (NEURONTIN) 300 MG capsule TAKE 1 CAPSULE BY MOUTH EVERY DAY AS DIRECTED-may take up to three times daily  . ipratropium (ATROVENT) 0.03 % nasal spray Place 2 sprays into both nostrils 2 (two) times daily.  Marland Kitchen levocetirizine (XYZAL) 5 MG tablet Take 5 mg by mouth every evening.  . rosuvastatin (CRESTOR) 20 MG tablet Take 20 mg by mouth daily.  .  sildenafil (VIAGRA) 100 MG tablet Take 100 mg by mouth daily as needed for erectile dysfunction.   No current facility-administered medications on file prior to visit.      Allergies:   Patient has no known allergies.   Social History   Socioeconomic History  . Marital status: Married    Spouse name: Not on file  . Number of children: 2  . Years of education: Not on file  . Highest education level: Not on file  Occupational History  . Occupation: Geographical information systems officer  . Financial resource strain: Not on file  . Food insecurity    Worry: Not on file    Inability: Not on file  . Transportation needs    Medical: Not on file    Non-medical: Not on file  Tobacco Use  . Smoking status: Never Smoker  . Smokeless tobacco: Never Used  Substance and Sexual Activity  . Alcohol use: Yes    Alcohol/week: 12.0 standard drinks    Types: 12 Standard drinks or equivalent per week    Comment: social   . Drug use: No  . Sexual activity: Yes    Partners: Female  Lifestyle  . Physical activity    Days per week: Not on file    Minutes per session: Not on file  . Stress: Not on file  Relationships  . Social Herbalist on phone: Not on file    Gets together: Not on file    Attends religious service: Not on file    Active member of club or organization: Not on file    Attends meetings of clubs or organizations: Not on file    Relationship status: Not on file  Other Topics Concern  . Not on file  Social History Narrative  . Not on file     Family History: The patient's family history includes Alcohol abuse in his father; Breast cancer in his mother; Diabetes in his mother; Heart attack in his mother; Stroke in his brother. There is no history of Colon cancer, Esophageal cancer, Rectal cancer, or Stomach cancer.  ROS:   Please see the history of present illness.  Additional pertinent ROS: Constitutional: Negative for chills, fever, night sweats, unintentional weight loss   HENT: Negative for ear pain and hearing loss.   Eyes: Negative for loss of vision and eye pain.  Respiratory: Negative for cough, sputum, wheezing.   Cardiovascular: See HPI. Gastrointestinal: Negative for abdominal pain, melena, and hematochezia.  Genitourinary: Negative for dysuria and hematuria.  Musculoskeletal: Negative for falls and myalgias.  Skin: Negative for itching and rash.  Neurological: Negative for focal weakness, focal sensory changes and loss of consciousness.  Endo/Heme/Allergies: Does not bruise/bleed easily.   EKGs/Labs/Other Studies Reviewed:    The following studies were reviewed today: Cardiac CT 05-18-19 Aorta: Mildly dilated  aortic root (42 mm). Minimal calcifications. No dissection.  Aortic Valve:  Trileaflet.  No calcifications. Coronary Arteries:  Normal coronary origin.  Right dominance.  RCA is a large dominant artery that gives rise to PDA and PLVB. There is diffuse minimal calcified plaque (0-24%); mild calcified plaque (25-49%) at takeoff of PDA. Minimal plaque (0-24%) in posterolateral.  Left main is a large artery that gives rise to LAD and LCX arteries. There is no plaque.  LAD is a large vessel that has 2 diagonals. There is calcified plaque in the proximal (50-69%) and mid vessel (0-24%); there is mild calcified plaque (25-49%) in proximal D1.  LCX is a non-dominant artery that gives rise to small OM1 and OM2. There is minimal calcified plaque (0-24%) noted.  Other findings: Normal pulmonary vein drainage into the left atrium. Normal let atrial appendage without a thrombus. 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  IMPRESSION: 1. Coronary calcium score of 2319. This was 37 percentile for age and sex matched control.  2. Normal coronary origin with right dominance.  3. Diffuse nonobstructive 3 vessel calcified plaque; 50-69% stenosis in proximal LAD; 25-49% stenosis proximal D1 and in RCA at takeoff of PDA. CADRADS-3.  Monitor  report 04/2019 14 days of data recorded on Zio monitor. Patient had a min HR of 48 bpm, max HR of 184 bpm, and avg HR of 86 bpm. Predominant underlying rhythm was Sinus Rhythm. No VT, SVT, atrial fibrillation, high degree block, or pauses noted. Isolated atrial ectopy was rare (<1%). Isolated ventricular ectopy was occasional (2.6%). There was a single 4 beat run of SVT and a single 4 beat run of PVCs.  Longest ventricular bigeminy was 23 seconds, and longest ventricular trigeminy episode was 38 seconds. There were 0 triggered events.    EKG:  EKG is personally reviewed.  The ekg ordered 03/25/19 demonstrates SR with PVCs--two morphologies, and 2 PVCs are consecutive  Recent Labs: 04/28/2019: BUN 11; Creatinine, Ser 0.78; Potassium 4.9; Sodium 137  Recent Lipid Panel No results found for: CHOL, TRIG, HDL, CHOLHDL, VLDL, LDLCALC, LDLDIRECT  Physical Exam:    VS:  BP 124/66   Pulse 87   Ht 6' (1.829 m)   Wt 180 lb (81.6 kg)   SpO2 98%   BMI 24.41 kg/m     Wt Readings from Last 3 Encounters:  05/20/19 180 lb (81.6 kg)  03/25/19 184 lb (83.5 kg)  03/01/19 182 lb 6 oz (82.7 kg)    GEN: Well nourished, well developed in no acute distress HEENT: Normal, moist mucous membranes NECK: No JVD CARDIAC: regular rhythm, normal S1 and S2, no rubs or gallops. No murmurs. VASCULAR: Radial and DP pulses 2+ bilaterally. No carotid bruits RESPIRATORY:  Clear to auscultation without rales, wheezing or rhonchi  ABDOMEN: Soft, non-tender, non-distended MUSCULOSKELETAL:  Ambulates independently SKIN: Warm and dry, no edema NEUROLOGIC:  Alert and oriented x 3. No focal neuro deficits noted. PSYCHIATRIC:  Normal affect    ASSESSMENT:    1. Encounter to discuss test results   2. Coronary artery disease due to lipid rich plaque   3. Coronary artery calcification seen on CT scan   4. Type 2 diabetes mellitus without complication, without long-term current use of insulin (HCC)   5. Cardiac risk  counseling   6. Counseling on health promotion and disease prevention    PLAN:    Evidence of significant CAD on cardiac CT: while no plaque with >70% stenosis suggest that chest pain is not ischemic in  origin, his very high calcium score and mixed/lipid rich plaque in proximal LAD is concerning. Has diffuse calcified plaue in 3 vessels as well as focal mixed/lipid rich plaque -we discussed the importance of aggressive prevention. He is tolerating aspiring 81 mg. -we discussed his lipids and the importance of statins. His cholesterol numbers were not as high as would be expected for his CAD, so question whether something like lipoprotein (a) is driving his risk -he stopped rosuvastatin due to myalgia. This was a complex symptom for him and improved somewhat with holding statin, but he was active around the time and also has chronic MSK issues. -he is amenable to rechallenging rosuvastatin. Discussed coenzyme Q10 and vitamin D as potential adjuncts to help statin tolerance. He is willing to try. -given the degree of his CAD, if he does not tolerate rosuvastatin, will try atorvastatin -if he cannot tolerate high intensity statin, will refer to our PharmD clinic to discuss PCK9i vs. nexletol for management. With his CAD and CV risk, would prefer PCSK9i given evidence for long term clinical benefit  PVCs: burden <3%. Do not want to change too many medications at the same time. Will focus on statin, but discuss beta blocker (low dose) at follow up given PVCs, CAD. -currently asymptomatic, but if worsens consider echo in the future  Cardiac risk counseling and prevention recommendations: -recommend heart healthy/Mediterranean diet, with whole grains, fruits, vegetable, fish, lean meats, nuts, and olive oil. Limit salt. -recommend moderate walking, 3-5 times/week for 30-50 minutes each session. Aim for at least 150 minutes.week. Goal should be pace of 3 miles/hours, or walking 1.5 miles in 30 minutes  -recommend avoidance of tobacco products. Avoid excess alcohol. -Additional risk factor control:  -Diabetes: given CAD and diabetes, would benefit from [facility] or [place]. Focused on one medication at a time, will discuss at follow up.  -Lipids: From 02/08/19 (prior to statin): Tchol 181, TG 102, HDL 37, LDL 124. See conversation re: statin above  -Blood pressure control: normal range today  -Weight: BMI <25, normal range  Plan for follow up: 3 mos or sooner PRN. If has been able to tolerate statin, recheck labs at that time  TIME SPENT WITH PATIENT: 27 minutes of direct patient care. More than 50% of that time was spent on coordination of care and counseling regarding review of test results and discussion of management.  Buford Dresser, MD, PhD [place]  [facility]   Medication Adjustments/Labs and Tests Ordered: Current medicines are reviewed at length with the patient today.  Concerns regarding medicines are outlined above.  No orders of the defined types were placed in this encounter.  No orders of the defined types were placed in this encounter.   Patient Instructions  Medication Instructions:  Can try coenzyme Q10 with statin as well as vitamin D to see if this helps you tolerate the rosuvastatin (crestor). Over the counter doses are fine.  *If you need a refill on your cardiac medications before your next appointment, please call your pharmacy*  Lab Work: None  Testing/Procedures: None  Follow-Up: At [facility], you and your health needs are our priority.  As part of our continuing mission to provide you with exceptional heart care, we have created designated Provider Care Teams.  These Care Teams include your primary Cardiologist (physician) and Advanced Practice Providers (APPs -  Physician Assistants and Nurse Practitioners) who all work together to provide you with the care you need, when you need it.  Your next appointment:  3 months  The format  for your next appointment:   In Person  Provider:   Buford Dresser, MD     Signed, Buford Dresser, MD PhD 05/20/2019 11:02 PM    [place]

## 2019-06-15 DIAGNOSIS — M545 Low back pain: Secondary | ICD-10-CM | POA: Diagnosis not present

## 2019-06-15 DIAGNOSIS — M5136 Other intervertebral disc degeneration, lumbar region: Secondary | ICD-10-CM | POA: Diagnosis not present

## 2019-06-18 DIAGNOSIS — L821 Other seborrheic keratosis: Secondary | ICD-10-CM | POA: Diagnosis not present

## 2019-06-18 DIAGNOSIS — D225 Melanocytic nevi of trunk: Secondary | ICD-10-CM | POA: Diagnosis not present

## 2019-06-18 DIAGNOSIS — X32XXXA Exposure to sunlight, initial encounter: Secondary | ICD-10-CM | POA: Diagnosis not present

## 2019-06-18 DIAGNOSIS — L570 Actinic keratosis: Secondary | ICD-10-CM | POA: Diagnosis not present

## 2019-06-24 DIAGNOSIS — M5136 Other intervertebral disc degeneration, lumbar region: Secondary | ICD-10-CM | POA: Diagnosis not present

## 2019-06-29 DIAGNOSIS — E7800 Pure hypercholesterolemia, unspecified: Secondary | ICD-10-CM | POA: Diagnosis not present

## 2019-06-29 DIAGNOSIS — Z23 Encounter for immunization: Secondary | ICD-10-CM | POA: Diagnosis not present

## 2019-06-29 DIAGNOSIS — I25119 Atherosclerotic heart disease of native coronary artery with unspecified angina pectoris: Secondary | ICD-10-CM | POA: Diagnosis not present

## 2019-06-29 DIAGNOSIS — E119 Type 2 diabetes mellitus without complications: Secondary | ICD-10-CM | POA: Diagnosis not present

## 2019-06-29 DIAGNOSIS — I10 Essential (primary) hypertension: Secondary | ICD-10-CM | POA: Diagnosis not present

## 2019-07-01 DIAGNOSIS — S83231D Complex tear of medial meniscus, current injury, right knee, subsequent encounter: Secondary | ICD-10-CM | POA: Diagnosis not present

## 2019-07-01 DIAGNOSIS — M1712 Unilateral primary osteoarthritis, left knee: Secondary | ICD-10-CM | POA: Diagnosis not present

## 2019-07-02 ENCOUNTER — Encounter: Admit: 2019-07-02 | Discharge: 2019-07-02 | Payer: Private Health Insurance - Indemnity

## 2019-07-06 DIAGNOSIS — Z8546 Personal history of malignant neoplasm of prostate: Secondary | ICD-10-CM | POA: Diagnosis not present

## 2019-07-06 DIAGNOSIS — E291 Testicular hypofunction: Secondary | ICD-10-CM | POA: Diagnosis not present

## 2019-07-07 ENCOUNTER — Telehealth: Payer: Self-pay

## 2019-07-07 NOTE — Telephone Encounter (Signed)
Daughter updated and verbalized understanding.  

## 2019-07-07 NOTE — Telephone Encounter (Signed)
He needs to be aware that there is evidence that NSAIDs can increase the risk of heart attack in people with known coronary disease. His CT shows that he does have significant coronary disease. If there is an alternative to NSAIDs (which would include advil/ibuprofen, aleve/naproxen, meloxicam, etc) it would be preferable. If there is no alternative, he should take only when needed, and if he has any chest pain he should present for emergency medical evaluation. Thanks.

## 2019-07-07 NOTE — Telephone Encounter (Signed)
Spoke with daughter who report pt is due for a knee replacement, but in the meantime his orthopaedic doctor is wanting him to take Ibuprofen 800 mg TID for pain. Daughter is calling to see if MD is ok with recommendation.

## 2019-07-08 DIAGNOSIS — M5416 Radiculopathy, lumbar region: Secondary | ICD-10-CM | POA: Diagnosis not present

## 2019-07-08 DIAGNOSIS — M25551 Pain in right hip: Secondary | ICD-10-CM | POA: Diagnosis not present

## 2019-07-13 DIAGNOSIS — E119 Type 2 diabetes mellitus without complications: Secondary | ICD-10-CM | POA: Diagnosis not present

## 2019-07-14 DIAGNOSIS — Z8546 Personal history of malignant neoplasm of prostate: Secondary | ICD-10-CM | POA: Diagnosis not present

## 2019-07-14 DIAGNOSIS — N393 Stress incontinence (female) (male): Secondary | ICD-10-CM | POA: Diagnosis not present

## 2019-07-14 DIAGNOSIS — N5231 Erectile dysfunction following radical prostatectomy: Secondary | ICD-10-CM | POA: Diagnosis not present

## 2019-07-14 DIAGNOSIS — E230 Hypopituitarism: Secondary | ICD-10-CM | POA: Diagnosis not present

## 2019-07-20 DIAGNOSIS — M25551 Pain in right hip: Secondary | ICD-10-CM | POA: Diagnosis not present

## 2019-07-20 DIAGNOSIS — M47817 Spondylosis without myelopathy or radiculopathy, lumbosacral region: Secondary | ICD-10-CM | POA: Diagnosis not present

## 2019-07-20 DIAGNOSIS — M5136 Other intervertebral disc degeneration, lumbar region: Secondary | ICD-10-CM | POA: Diagnosis not present

## 2019-07-28 DIAGNOSIS — M1711 Unilateral primary osteoarthritis, right knee: Secondary | ICD-10-CM | POA: Diagnosis not present

## 2019-08-04 DIAGNOSIS — M1711 Unilateral primary osteoarthritis, right knee: Secondary | ICD-10-CM | POA: Diagnosis not present

## 2019-08-10 DIAGNOSIS — I25119 Atherosclerotic heart disease of native coronary artery with unspecified angina pectoris: Secondary | ICD-10-CM | POA: Diagnosis not present

## 2019-08-10 DIAGNOSIS — E7800 Pure hypercholesterolemia, unspecified: Secondary | ICD-10-CM | POA: Diagnosis not present

## 2019-08-10 DIAGNOSIS — E119 Type 2 diabetes mellitus without complications: Secondary | ICD-10-CM | POA: Diagnosis not present

## 2019-08-10 DIAGNOSIS — I10 Essential (primary) hypertension: Secondary | ICD-10-CM | POA: Diagnosis not present

## 2019-08-11 DIAGNOSIS — M1711 Unilateral primary osteoarthritis, right knee: Secondary | ICD-10-CM | POA: Diagnosis not present

## 2019-08-16 ENCOUNTER — Encounter: Admit: 2019-08-16 | Discharge: 2019-08-16 | Payer: Private Health Insurance - Indemnity

## 2019-08-23 ENCOUNTER — Ambulatory Visit: Payer: BC Managed Care – PPO | Admitting: Cardiology

## 2019-08-23 ENCOUNTER — Other Ambulatory Visit: Payer: Self-pay

## 2019-08-23 ENCOUNTER — Encounter: Payer: Self-pay | Admitting: Cardiology

## 2019-08-23 VITALS — BP 142/82 | HR 98 | Ht 74.0 in | Wt 175.6 lb

## 2019-08-23 DIAGNOSIS — I2583 Coronary atherosclerosis due to lipid rich plaque: Secondary | ICD-10-CM

## 2019-08-23 DIAGNOSIS — N529 Male erectile dysfunction, unspecified: Secondary | ICD-10-CM

## 2019-08-23 DIAGNOSIS — I2510 Atherosclerotic heart disease of native coronary artery without angina pectoris: Secondary | ICD-10-CM | POA: Diagnosis not present

## 2019-08-23 DIAGNOSIS — I493 Ventricular premature depolarization: Secondary | ICD-10-CM | POA: Diagnosis not present

## 2019-08-23 DIAGNOSIS — E7800 Pure hypercholesterolemia, unspecified: Secondary | ICD-10-CM

## 2019-08-23 DIAGNOSIS — Z79899 Other long term (current) drug therapy: Secondary | ICD-10-CM

## 2019-08-23 DIAGNOSIS — E119 Type 2 diabetes mellitus without complications: Secondary | ICD-10-CM

## 2019-08-23 NOTE — Progress Notes (Signed)
Cardiology Office Note:    Date:  08/23/2019   ID:  Jeff Jenkins, DOB September 25, 1955, MRN [id number]  PCP:  Deland Pretty, MD  Cardiologist:  Buford Dresser, MD PhD  Referring MD: Deland Pretty, MD   CC: follow up  History of Present Illness:    Jeff Jenkins is a 64 y.o. male with a hx of allergies, type II diabetes lifestyle controlled who is seen for follow up today. He was initially seen 04/04/19 as a new consult at the request of Deland Pretty, MD for the evaluation and management of elevated cardiovascular risk.  CV history/risk factors: Strong family history: lost mother to his heart attack suddenly at age 34 (had evidence of old MI that was unknown). Lost his brother to a massive stroke earlier this year at age 37.   Calcium noted on CXR 02/2019, started on rosuvastatin but recently stopped due to myalgia. Risk factors of type II diabetes. CT cardiac done 05/18/19, CAD noted with high calcium score, reviewed below.  Today: Retired at the end of December. Hopes to travel once the pandemic is over. Brings a log of blood pressures, HR, sugars. Blood pressures have ranged from 100/70-138/91. HR range 88-109.  Tolerating atorvastatin better than rosuvastatin. Only rare muscle aches.   He hasn't been very active recently due to knee and back pain. Once these are improved, he hopes to return to activity. We discussed metoprolol today, pros/cons. He wants to avoid potential fatigue as a side effect. He already feels like his energy level is not where it was a few years.   Takes cialis daily (has for 9 years) and viagra as needed. We discussed risk of nitrates with PDE5 inhibitors.  Takes aspirin daily, though on hold due to back injection tomorrow.  Denies chest pain, shortness of breath at rest or with normal exertion. No PND, orthopnea, LE edema or unexpected weight gain. No syncope or palpitations.   Past Medical History:  Diagnosis Date  . Arthritis   . Broken ankle 2008    . Diverticulosis   . ED (erectile dysfunction)   . Facial fracture ([place]) 1979  . GERD (gastroesophageal reflux disease)   . Hearing loss in right ear   . Hemorrhoids   . Hiatal hernia   . Peptic stricture of esophagus   . Peptic ulcer disease 1989  . Prostate cancer ([place]) 02/2009  . Seasonal allergies     Past Surgical History:  Procedure Laterality Date  . ANKLE SURGERY Left 2008  . INGUINAL HERNIA REPAIR Left   . KNEE ARTHROSCOPY Left 2013  . PROSTATECTOMY  2011  . VASECTOMY      Current Medications: Current Outpatient Medications on File Prior to Visit  Medication Sig  . acetaminophen (TYLENOL) 325 MG tablet Take 650 mg by mouth every 6 (six) hours as needed for moderate pain.  Marland Kitchen aspirin EC 81 MG tablet Take 81 mg by mouth daily.  Marland Kitchen aspirin-acetaminophen-caffeine (EXCEDRIN MIGRAINE) 250-250-65 MG tablet Take by mouth every 6 (six) hours as needed for headache.  Marland Kitchen atorvastatin (LIPITOR) 40 MG tablet Take 40 mg by mouth daily.  Hinda Kehr 30 MG/ACT SOLN   . azelastine (ASTELIN) 0.1 % nasal spray Place 1 spray into both nostrils 2 (two) times daily. Use in each nostril as directed  . CIALIS 5 MG tablet Take 5 mg by mouth daily.   . diphenhydrAMINE (BENADRYL) 25 MG tablet Take 25 mg by mouth daily as needed.    Marland Kitchen esomeprazole (NEXIUM) 40  MG capsule TAKE 1 CAPSULE BY MOUTH EVERY DAY BEFORE BREAKFAST  . gabapentin (NEURONTIN) 300 MG capsule TAKE 1 CAPSULE BY MOUTH EVERY DAY AS DIRECTED-may take up to three times daily  . ipratropium (ATROVENT) 0.03 % nasal spray Place 2 sprays into both nostrils 2 (two) times daily.  Marland Kitchen levocetirizine (XYZAL) 5 MG tablet Take 5 mg by mouth every evening.  Marland Kitchen lisinopril (ZESTRIL) 5 MG tablet Take 5 mg by mouth daily.  . rosuvastatin (CRESTOR) 20 MG tablet Take 20 mg by mouth daily.  . sildenafil (VIAGRA) 100 MG tablet Take 100 mg by mouth daily as needed for erectile dysfunction.   No current facility-administered medications on file prior to visit.      Allergies:   Patient has no known allergies.   Social History   Tobacco Use  . Smoking status: Never Smoker  . Smokeless tobacco: Never Used  Substance Use Topics  . Alcohol use: Yes    Alcohol/week: 12.0 standard drinks    Types: 12 Standard drinks or equivalent per week    Comment: social   . Drug use: No    Family History: The patient's family history includes Alcohol abuse in his father; Breast cancer in his mother; Diabetes in his mother; Heart attack in his mother; Stroke in his brother. There is no history of Colon cancer, Esophageal cancer, Rectal cancer, or Stomach cancer.  ROS:   Please see the history of present illness.  Additional pertinent ROS otherwise negative  EKGs/Labs/Other Studies Reviewed:    The following studies were reviewed today: Cardiac CT 05-18-19 Aorta: Mildly dilated aortic root (42 mm). Minimal calcifications. No dissection.  Aortic Valve:  Trileaflet.  No calcifications. Coronary Arteries:  Normal coronary origin.  Right dominance.  RCA is a large dominant artery that gives rise to PDA and PLVB. There is diffuse minimal calcified plaque (0-24%); mild calcified plaque (25-49%) at takeoff of PDA. Minimal plaque (0-24%) in posterolateral.  Left main is a large artery that gives rise to LAD and LCX arteries. There is no plaque.  LAD is a large vessel that has 2 diagonals. There is calcified plaque in the proximal (50-69%) and mid vessel (0-24%); there is mild calcified plaque (25-49%) in proximal D1.  LCX is a non-dominant artery that gives rise to small OM1 and OM2. There is minimal calcified plaque (0-24%) noted.  Other findings: Normal pulmonary vein drainage into the left atrium. Normal let atrial appendage without a thrombus. 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  IMPRESSION: 1. Coronary calcium score of 2319. This was 51 percentile for age and sex matched control.  2. Normal coronary origin with right  dominance.  3. Diffuse nonobstructive 3 vessel calcified plaque; 50-69% stenosis in proximal LAD; 25-49% stenosis proximal D1 and in RCA at takeoff of PDA. CADRADS-3.  Monitor report 04/2019 14 days of data recorded on Zio monitor. Patient had a min HR of 48 bpm, max HR of 184 bpm, and avg HR of 86 bpm. Predominant underlying rhythm was Sinus Rhythm. No VT, SVT, atrial fibrillation, high degree block, or pauses noted. Isolated atrial ectopy was rare (<1%). Isolated ventricular ectopy was occasional (2.6%). There was a single 4 beat run of SVT and a single 4 beat run of PVCs.  Longest ventricular bigeminy was 23 seconds, and longest ventricular trigeminy episode was 38 seconds. There were 0 triggered events.    EKG:  EKG is personally reviewed.  The ekg ordered 03/25/19 demonstrates SR with PVCs--two morphologies, and 2 PVCs are  consecutive  Recent Labs: 04/28/2019: BUN 11; Creatinine, Ser 0.78; Potassium 4.9; Sodium 137  Recent Lipid Panel No results found for: CHOL, TRIG, HDL, CHOLHDL, VLDL, LDLCALC, LDLDIRECT  Physical Exam:    VS:  BP (!) 142/82   Pulse 98   Ht 6\' 2"  (1.88 m)   Wt 175 lb 9.6 oz (79.7 kg)   SpO2 96%   BMI 22.55 kg/m     Wt Readings from Last 3 Encounters:  08/23/19 175 lb 9.6 oz (79.7 kg)  05/20/19 180 lb (81.6 kg)  03/25/19 184 lb (83.5 kg)    GEN: Well nourished, well developed in no acute distress HEENT: Normal, moist mucous membranes NECK: No JVD CARDIAC: regular rhythm, normal S1 and S2, no rubs or gallops. No murmur. VASCULAR: Radial and DP pulses 2+ bilaterally. No carotid bruits RESPIRATORY:  Clear to auscultation without rales, wheezing or rhonchi  ABDOMEN: Soft, non-tender, non-distended MUSCULOSKELETAL:  Ambulates independently SKIN: Warm and dry, no edema NEUROLOGIC:  Alert and oriented x 3. No focal neuro deficits noted. PSYCHIATRIC:  Normal affect   ASSESSMENT:    1. Coronary artery disease due to lipid rich plaque   2. Medication  management   3. Coronary artery calcification seen on CT scan   4. Pure hypercholesterolemia   5. PVC's (premature ventricular contractions)   6. Erectile dysfunction, unspecified erectile dysfunction type   7. Type 2 diabetes mellitus without obesity (HCC)    PLAN:    Evidence of significant CAD on cardiac CT: while no plaque with >70% stenosis suggest that chest pain is not ischemic in origin, his very high calcium score and mixed/lipid rich plaque in proximal LAD is concerning. Has diffuse calcified plaue in 3 vessels as well as focal mixed/lipid rich plaque -we discussed the importance of aggressive prevention. He is tolerating aspiring 81 mg.  Hypercholesterolemia: goal LDL <70 given CAD -he is tolerating atorvastatin 40 mg daily, continue -From 02/08/19 (prior to statin): Tchol 181, TG 102, HDL 37, LDL 124 -recheck lipids, LFTs today  Erectile dysfunction: discussed PDE5 inhibitors and nitroglycerin interaction. He has not had any chest pain but is aware that he needs to mention this to ER/EMS if he does to avoid severe hypotension.  PVCs: burden <3%.  -currently asymptomatic, but if worsens consider echo in the future. Have discussed beta blockers. Monitor for symptoms.  Type II diabetes without obesity: BMI 22 -with CAD, would benefit from SGLT2i or GLP1RA. Continue to disucss  Cardiac risk counseling and prevention recommendations: -recommend heart healthy/Mediterranean diet, with whole grains, fruits, vegetable, fish, lean meats, nuts, and olive oil. Limit salt. -recommend moderate walking, 3-5 times/week for 30-50 minutes each session. Aim for at least 150 minutes.week. Goal should be pace of 3 miles/hours, or walking 1.5 miles in 30 minutes -recommend avoidance of tobacco products. Avoid excess alcohol.  Plan for follow up: 6 mos or sooner as needed  Medication Adjustments/Labs and Tests Ordered: Current medicines are reviewed at length with the patient today.  Concerns  regarding medicines are outlined above.  Orders Placed This Encounter  Procedures  . Lipid panel  . Hepatic function panel   No orders of the defined types were placed in this encounter.   Patient Instructions  Medication Instructions:  Your Physician recommend you continue on your current medication as directed.    *If you need a refill on your cardiac medications before your next appointment, please call your pharmacy*  Lab Work: Your physician recommends that you return for lab work  today (Lipid, LFT)   Testing/Procedures: None  Follow-Up: At [facility], you and your health needs are our priority.  As part of our continuing mission to provide you with exceptional heart care, we have created designated Provider Care Teams.  These Care Teams include your primary Cardiologist (physician) and Advanced Practice Providers (APPs -  Physician Assistants and Nurse Practitioners) who all work together to provide you with the care you need, when you need it.  Your next appointment:   6 month(s)  The format for your next appointment:   In Person  Provider:   Buford Dresser, MD     Signed, Buford Dresser, MD PhD 08/23/2019  [place]

## 2019-08-23 NOTE — Patient Instructions (Addendum)
Medication Instructions:  Your Physician recommend you continue on your current medication as directed.    *If you need a refill on your cardiac medications before your next appointment, please call your pharmacy*  Lab Work: Your physician recommends that you return for lab work today (Lipid, LFT)   Testing/Procedures: None  Follow-Up: At [facility], you and your health needs are our priority.  As part of our continuing mission to provide you with exceptional heart care, we have created designated Provider Care Teams.  These Care Teams include your primary Cardiologist (physician) and Advanced Practice Providers (APPs -  Physician Assistants and Nurse Practitioners) who all work together to provide you with the care you need, when you need it.  Your next appointment:   6 month(s)  The format for your next appointment:   In Person  Provider:   Buford Dresser, MD

## 2019-08-24 DIAGNOSIS — M5136 Other intervertebral disc degeneration, lumbar region: Secondary | ICD-10-CM | POA: Diagnosis not present

## 2019-08-24 LAB — HEPATIC FUNCTION PANEL
ALT: 20 IU/L (ref 0–44)
AST: 18 IU/L (ref 0–40)
Albumin: 4.9 g/dL — ABNORMAL HIGH (ref 3.8–4.8)
Alkaline Phosphatase: 102 IU/L (ref 39–117)
Bilirubin Total: 0.9 mg/dL (ref 0.0–1.2)
Bilirubin, Direct: 0.21 mg/dL (ref 0.00–0.40)
Total Protein: 7.2 g/dL (ref 6.0–8.5)

## 2019-08-24 LAB — LIPID PANEL
Chol/HDL Ratio: 2.6 ratio (ref 0.0–5.0)
Cholesterol, Total: 170 mg/dL (ref 100–199)
HDL: 66 mg/dL (ref >39)
LDL Chol Calc (NIH): 91 mg/dL (ref 0–99)
Triglycerides: 70 mg/dL (ref 0–149)
VLDL Cholesterol Cal: 13 mg/dL (ref 5–40)

## 2019-09-07 DIAGNOSIS — M5136 Other intervertebral disc degeneration, lumbar region: Secondary | ICD-10-CM | POA: Diagnosis not present

## 2019-09-07 DIAGNOSIS — M47816 Spondylosis without myelopathy or radiculopathy, lumbar region: Secondary | ICD-10-CM | POA: Diagnosis not present

## 2019-09-10 DIAGNOSIS — M1711 Unilateral primary osteoarthritis, right knee: Secondary | ICD-10-CM | POA: Diagnosis not present

## 2019-09-14 ENCOUNTER — Encounter: Admit: 2019-09-14 | Discharge: 2019-09-14 | Payer: Private Health Insurance - Indemnity

## 2019-09-15 ENCOUNTER — Encounter: Payer: Self-pay | Admitting: Cardiology

## 2019-09-15 DIAGNOSIS — N529 Male erectile dysfunction, unspecified: Secondary | ICD-10-CM | POA: Insufficient documentation

## 2019-09-17 DIAGNOSIS — M545 Low back pain: Secondary | ICD-10-CM | POA: Diagnosis not present

## 2019-09-27 DIAGNOSIS — M545 Low back pain: Secondary | ICD-10-CM | POA: Diagnosis not present

## 2019-09-30 DIAGNOSIS — M545 Low back pain: Secondary | ICD-10-CM | POA: Diagnosis not present

## 2019-10-05 ENCOUNTER — Encounter: Admit: 2019-10-05 | Discharge: 2019-10-05 | Payer: Private Health Insurance - Indemnity

## 2019-10-06 DIAGNOSIS — M545 Low back pain: Secondary | ICD-10-CM | POA: Diagnosis not present

## 2019-10-14 DIAGNOSIS — M1711 Unilateral primary osteoarthritis, right knee: Secondary | ICD-10-CM | POA: Diagnosis not present

## 2019-10-22 DIAGNOSIS — M1711 Unilateral primary osteoarthritis, right knee: Secondary | ICD-10-CM | POA: Diagnosis not present

## 2019-10-22 DIAGNOSIS — M1712 Unilateral primary osteoarthritis, left knee: Secondary | ICD-10-CM | POA: Diagnosis not present

## 2019-11-08 DIAGNOSIS — M25561 Pain in right knee: Secondary | ICD-10-CM | POA: Diagnosis not present

## 2019-11-08 DIAGNOSIS — M1711 Unilateral primary osteoarthritis, right knee: Secondary | ICD-10-CM | POA: Diagnosis not present

## 2019-11-09 DIAGNOSIS — E7800 Pure hypercholesterolemia, unspecified: Secondary | ICD-10-CM | POA: Diagnosis not present

## 2019-11-09 DIAGNOSIS — I10 Essential (primary) hypertension: Secondary | ICD-10-CM | POA: Diagnosis not present

## 2019-11-09 DIAGNOSIS — E119 Type 2 diabetes mellitus without complications: Secondary | ICD-10-CM | POA: Diagnosis not present

## 2019-11-11 DIAGNOSIS — I25119 Atherosclerotic heart disease of native coronary artery with unspecified angina pectoris: Secondary | ICD-10-CM | POA: Diagnosis not present

## 2019-11-11 DIAGNOSIS — E119 Type 2 diabetes mellitus without complications: Secondary | ICD-10-CM | POA: Diagnosis not present

## 2019-11-11 DIAGNOSIS — I10 Essential (primary) hypertension: Secondary | ICD-10-CM | POA: Diagnosis not present

## 2019-11-11 DIAGNOSIS — E7800 Pure hypercholesterolemia, unspecified: Secondary | ICD-10-CM | POA: Diagnosis not present

## 2019-11-12 ENCOUNTER — Telehealth: Payer: Self-pay

## 2019-11-12 DIAGNOSIS — D225 Melanocytic nevi of trunk: Secondary | ICD-10-CM | POA: Diagnosis not present

## 2019-11-12 DIAGNOSIS — I10 Essential (primary) hypertension: Secondary | ICD-10-CM | POA: Diagnosis not present

## 2019-11-12 DIAGNOSIS — L570 Actinic keratosis: Secondary | ICD-10-CM | POA: Diagnosis not present

## 2019-11-12 DIAGNOSIS — R5383 Other fatigue: Secondary | ICD-10-CM | POA: Diagnosis not present

## 2019-11-12 DIAGNOSIS — X32XXXD Exposure to sunlight, subsequent encounter: Secondary | ICD-10-CM | POA: Diagnosis not present

## 2019-11-12 NOTE — Telephone Encounter (Signed)
   [name]    Request for surgical clearance:  1. What type of surgery is being performed? Right total knee arthroplasty  2. When is this surgery scheduled? 01/03/20  3. What type of clearance is required (medical clearance vs. Pharmacy clearance to hold med vs. Both)? both  4. Are there any medications that need to be held prior to surgery and how long? ASA 7 days prior to surgery   5. Practice name and name of physician performing surgery? [facility]-- Dr. Paralee Cancel  6. What is your office phone number [phone]   7.   What is your office fax number [phone]  8.   Anesthesia type (None, local, MAC, general) ? spinal   Jeff Jenkins Jeff Jenkins 11/12/2019, 12:38 PM  _________________________________________________________________   (provider comments below)

## 2019-11-12 NOTE — Telephone Encounter (Signed)
   Primary Cardiologist: Buford Dresser, MD  Chart reviewed as part of pre-operative protocol coverage. Patient was contacted 11/12/2019 in reference to pre-operative risk assessment for pending surgery as outlined below.  Jeff Jenkins was last seen on 08/23/2019 by Dr. Harrell Gave.  Since that day, Jeff Jenkins has done well from a cardiac standpoint. 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 He can easily complete 4 METs without anginal complaints.   Therefore, based on ACC/AHA guidelines, the patient would be at acceptable risk for the planned procedure without further cardiovascular testing.   He can hold aspirin 5-7 days prior to his surgery if needed with plans to restart when cleared to do so by Dr. Alvan Dame.   I will route this recommendation to the requesting party via Epic fax function and remove from pre-op pool.  Please call with questions.  Abigail Butts, PA-C 11/12/2019, 2:38 PM

## 2019-12-08 DIAGNOSIS — Z01818 Encounter for other preprocedural examination: Secondary | ICD-10-CM | POA: Diagnosis not present

## 2019-12-08 DIAGNOSIS — E291 Testicular hypofunction: Secondary | ICD-10-CM | POA: Diagnosis not present

## 2019-12-08 DIAGNOSIS — E538 Deficiency of other specified B group vitamins: Secondary | ICD-10-CM | POA: Diagnosis not present

## 2019-12-08 DIAGNOSIS — Z8546 Personal history of malignant neoplasm of prostate: Secondary | ICD-10-CM | POA: Diagnosis not present

## 2019-12-08 DIAGNOSIS — E119 Type 2 diabetes mellitus without complications: Secondary | ICD-10-CM | POA: Diagnosis not present

## 2019-12-27 ENCOUNTER — Other Ambulatory Visit: Payer: Self-pay

## 2019-12-27 ENCOUNTER — Ambulatory Visit (INDEPENDENT_AMBULATORY_CARE_PROVIDER_SITE_OTHER): Payer: BC Managed Care – PPO | Admitting: Internal Medicine

## 2019-12-27 ENCOUNTER — Encounter: Payer: Self-pay | Admitting: Internal Medicine

## 2019-12-27 VITALS — BP 150/86 | HR 108 | Temp 98.4°F | Wt 171.0 lb

## 2019-12-27 DIAGNOSIS — R1011 Right upper quadrant pain: Secondary | ICD-10-CM

## 2019-12-27 DIAGNOSIS — R634 Abnormal weight loss: Secondary | ICD-10-CM | POA: Diagnosis not present

## 2019-12-27 DIAGNOSIS — E119 Type 2 diabetes mellitus without complications: Secondary | ICD-10-CM

## 2019-12-27 NOTE — Progress Notes (Signed)
Chief Complaint  Patient presents with  . Chest Pain    Lower right rib pain, dull pain and at times sharp pain, been going on for 2 days    HPI: Jeff Jenkins 64 y.o. come in for new patient visit   To be established with Dr Jeff Jenkins and has a problem in interim . He is a 64 year old gentleman who recently retired patient of Dr. Concha Jenkins is planning to have TKA Dr. Alvan Jenkins in a week June 21 for knee arthritis has underlying androgen deficiency history of prostate cancer diabetes type 2 in nonobese controlled and over the last 2 years some unintentional weight loss. Over the last 2   days he noted onset of right upper quadrant pain under the rib cage after eating.  Bloating sharp and dull.  About an 8 /10  Pain level when flares.     No hx of same .   Onset after   Eating  Jeff Jenkins and all kinds of  cook out food.  Waxing  and waning.   8/10 when bad  No vomiting .  Sometimes pain radiates to back but not to shoulder .  no sob   But can feel with  Instpiration.  Family  Member advised  Seeking medical evaluation for this issue.  Weight loss  About   Dec 2020 180.   2 year ago  was 190 .   Has record   Presents today pre op from Dr Jeff Jenkins.    Labs done 5 26 and April this year .  Is early diabetic range  Nl transaminases total bili1.3  In past was taking   otc burburing ? Supplement  Advised by care team  and losing weight including muscle mass .   So  Then stopped  A year ago .  No fam hx gb issues  Had  colono 2-3 year .ago  Non worrisome polyps    No change bowel  habots and utd on colon  etoh 1-2   Or 6 on weekend  No current supplements vomiting change in bowel habits  ROS: See pertinent positives and negatives per HPI. hsa urologist has hx of prostate ca   And has fu has renal cysts  Back : has had  Ortho injections recently   Had mri back and then noted abodmen and cysts on kidneys and liver .  Past Medical History:  Diagnosis Date  . Arthritis   . Broken ankle 2008  .  Diverticulosis   . ED (erectile dysfunction)   . Facial fracture ([place]) 1979  . GERD (gastroesophageal reflux disease)   . Hearing loss in right ear   . Hemorrhoids   . Hiatal hernia   . Peptic stricture of esophagus   . Peptic ulcer disease 1989  . Prostate cancer (Jeff Jenkins) 02/2009  . Seasonal allergies     Family History  Problem Relation Age of Onset  . Breast cancer Mother   . Diabetes Mother   . Heart attack Mother   . Alcohol abuse Father   . Stroke Brother   . Colon cancer Neg Hx   . Esophageal cancer Neg Hx   . Rectal cancer Neg Hx   . Stomach cancer Neg Hx     Social History   Socioeconomic History  . Marital status: Married    Spouse name: Not on file  . Number of children: 2  . Years of education: Not on file  . Highest education level: Not on file  Occupational History  . Occupation: Press photographer  Tobacco Use  . Smoking status: Never Smoker  . Smokeless tobacco: Never Used  Vaping Use  . Vaping Use: Never used  Substance and Sexual Activity  . Alcohol use: Yes    Alcohol/week: 12.0 standard drinks    Types: 12 Standard drinks or equivalent per week    Comment: social   . Drug use: No  . Sexual activity: Yes    Partners: Female  Other Topics Concern  . Not on file  Social History Narrative  . Not on file   Social Determinants of Health   Financial Resource Strain:   . Difficulty of Paying Living Expenses:   Food Insecurity:   . Worried About Charity fundraiser in the Last Year:   . Arboriculturist in the Last Year:   Transportation Needs:   . Film/video editor (Medical):   Marland Kitchen Lack of Transportation (Non-Medical):   Physical Activity:   . Days of Exercise per Week:   . Minutes of Exercise per Session:   Stress:   . Feeling of Stress :   Social Connections:   . Frequency of Communication with Friends and Family:   . Frequency of Social Gatherings with Friends and Family:   . Attends Religious Services:   . Active Member of Clubs or  Organizations:   . Attends Archivist Meetings:   Marland Kitchen Marital Status:     Outpatient Medications Prior to Visit  Medication Sig Dispense Refill  . acetaminophen (TYLENOL) 325 MG tablet Take 650 mg by mouth every 6 (six) hours as needed for moderate pain.    Marland Kitchen aspirin EC 81 MG tablet Take 81 mg by mouth daily.    Marland Kitchen atorvastatin (LIPITOR) 40 MG tablet Take 40 mg by mouth daily.    Hinda Kehr 30 MG/ACT SOLN     . azelastine (ASTELIN) 0.1 % nasal spray Place 1 spray into both nostrils 2 (two) times daily. Use in each nostril as directed    . CIALIS 5 MG tablet Take 5 mg by mouth daily.     . diphenhydrAMINE (BENADRYL) 25 MG tablet Take 25 mg by mouth daily as needed.      Marland Kitchen esomeprazole (NEXIUM) 40 MG capsule TAKE 1 CAPSULE BY MOUTH EVERY DAY BEFORE BREAKFAST 90 capsule 3  . ezetimibe (ZETIA) 10 MG tablet Take 10 mg by mouth daily.    Marland Kitchen ipratropium (ATROVENT) 0.03 % nasal spray Place 2 sprays into both nostrils 2 (two) times daily.    Marland Kitchen lisinopril (ZESTRIL) 5 MG tablet Take 5 mg by mouth daily.    . sildenafil (VIAGRA) 100 MG tablet Take 100 mg by mouth daily as needed for erectile dysfunction.    . gabapentin (NEURONTIN) 300 MG capsule TAKE 1 CAPSULE BY MOUTH EVERY DAY AS DIRECTED-may take up to three times daily (Patient not taking: Reported on 12/27/2019)  1  . levocetirizine (XYZAL) 5 MG tablet Take 5 mg by mouth every evening. (Patient not taking: Reported on 12/27/2019)    . loratadine (CLARITIN) 10 MG tablet Claritin 10 mg tablet    . SYNJARDY XR 25-1000 MG TB24      No facility-administered medications prior to visit.     EXAM:  BP (!) 150/86   Pulse (!) 108   Temp 98.4 F (36.9 C) (Temporal)   Wt 171 lb (77.6 kg)   SpO2 95%   BMI 21.96 kg/m   Body mass index is 21.96 kg/m.  GENERAL: vitals reviewed and listed above, alert, oriented, appears well hydrated and in no acute distress HEENT: atraumatic, conjunctiva  clear, no obvious abnormalities on inspection of  external nose and ears OP :masked NECK: no obvious masses on inspection palpation  LUNGS: clear to auscultation bilaterally, no wheezes, rales or rhonchi, good air movement CV: HRRR, rate 90 no g or murnur  no clubbing cyanosis or  peripheral edema nl cap refill  Abdomen:  Sof,t normal bowel sounds without hepatosplenomegaly, no guarding rebound or masses no CVA tenderness area of  Tenderness is ruq under rib cage at anterior clavicular line  MS: moves all extremities without noticeable focal  abnormality PSYCH: pleasant and cooperative, no obvious depression or anxiety Blood work record review  Lab Results  Component Value Date   WBC 5.6 07/04/2010   HGB 14.2 07/11/2010   HCT 41.5 07/11/2010   PLT 157 07/04/2010   GLUCOSE 214 (H) 04/28/2019   CHOL 170 08/23/2019   TRIG 70 08/23/2019   HDL 66 08/23/2019   LDLCALC 91 08/23/2019   ALT 20 08/23/2019   AST 18 08/23/2019   NA 137 04/28/2019   K 4.9 04/28/2019   CL 99 04/28/2019   CREATININE 0.78 04/28/2019   BUN 11 04/28/2019   CO2 25 04/28/2019   INR 1.0 01/17/2007   BP Readings from Last 3 Encounters:  12/27/19 (!) 150/86  08/23/19 (!) 142/82  05/20/19 124/66   Wt Readings from Last 3 Encounters:  12/27/19 171 lb (77.6 kg)  08/23/19 175 lb 9.6 oz (79.7 kg)  05/20/19 180 lb (81.6 kg)  mri showed renal and hepatic cysts  And hepatic steatosis done 2020 after  Mri of back from ortho showed cysts .  Paper record from dr Jeff Jenkins  Reviewed  ASSESSMENT AND PLAN:  Discussed the following assessment and plan:  RUQ abdominal pain - Plan: US Abdomen Complete  Loss of weight - Plan: US Abdomen Complete  Type 2 diabetes mellitus without obesity ([place]) - last a1c 6.3  No clear explanation for weight loss over past year or so  To establish care new practice  bp up today he feelst from  [facility] effect as has been in range otherwise .  order placed for stat abd [place]  To be done in am at GI  Will get info to dr H for fu care evaluation as  indicated   -Patient advised to return or notify health care team  if  new concerns arise. In interim   Patient Instructions  Panning abdominal ultrasound to check lever GB area    Then go from there  Pain in the correct area of biliary pain .  Further eval can be done by der Henrnandez and in follow up.  Your exam is normal today .    I agree the weight loss  Situation should have follo w up.          Standley Brooking. Romelle Muldoon M.D.

## 2019-12-27 NOTE — Patient Instructions (Signed)
Panning abdominal ultrasound to check lever GB area    Then go from there  Pain in the correct area of biliary pain .  Further eval can be done by der Henrnandez and in follow up.  Your exam is normal today .    I agree the weight loss  Situation should have follo w up.

## 2019-12-28 ENCOUNTER — Telehealth: Payer: Self-pay

## 2019-12-28 ENCOUNTER — Other Ambulatory Visit: Payer: Self-pay

## 2019-12-28 ENCOUNTER — Ambulatory Visit
Admission: RE | Admit: 2019-12-28 | Discharge: 2019-12-28 | Disposition: A | Discharge status: Home / Self Care | Payer: BC Managed Care – PPO | Source: Ambulatory Visit | Attending: Internal Medicine | Admitting: Internal Medicine

## 2019-12-28 DIAGNOSIS — R1011 Right upper quadrant pain: Secondary | ICD-10-CM

## 2019-12-28 DIAGNOSIS — R634 Abnormal weight loss: Secondary | ICD-10-CM

## 2019-12-28 DIAGNOSIS — K8050 Calculus of bile duct without cholangitis or cholecystitis without obstruction: Secondary | ICD-10-CM

## 2019-12-28 NOTE — Telephone Encounter (Signed)
Patient called back and is worried about the cysts on his liver and kidneys and wanted to know who to see for this. I asked Dr. Regis Bill and she stated that the surgeon should be able to guide him for this.

## 2019-12-28 NOTE — Telephone Encounter (Signed)
Called patient and gave him the information on referral and gave him the message from Dr. Regis Bill to check with surgeon about liver cysts while he is being see for his gallbladder. Patient verbalized an understanding.

## 2019-12-28 NOTE — Progress Notes (Signed)
Ultrasounds shows gall stones and possible common bile duct  stone( also other  non urgent findings :liver cysts and mild widening of of aorta that needs  non emergent follow up)   Please make a stat  surgical referral   For choledocholithiais and ruq pain   (knee surgery may need to be  delayed .) If pain increase of fever then seek care in ED. )

## 2019-12-28 NOTE — Telephone Encounter (Signed)
Received STAT findings on [place] Ultrasound for patient from GI. Please review and advise. Hard copy placed on desk.

## 2019-12-28 NOTE — Telephone Encounter (Signed)
Done see result note  Referral to surgery info to his urologist

## 2020-01-13 ENCOUNTER — Ambulatory Visit: Payer: Self-pay | Admitting: Surgery

## 2020-01-13 NOTE — H&P (View-Only) (Signed)
Surgical Evaluation   HPI: 64 year old man with history of knee arthritis and prostate cancer status post prostatectomy and type 2 diabetes who is referred by [facility] where he was seen on 6/14 when he presented with a 2 day history postprandial right upper quadrant pain. This is associated with bloating and alternating between sharp and dull. This initiated after eating a hamburger and other kinds of fast food, waxing and waning reaching a level of 8 out of 10. Some radiation to the back and aggravation by inspiration. No nausea or emesis.  Ultrasound was then performed on 6/15 demonstrating cholelithiasis and sludge without sonographic signs of cholecystitis. Common bile duct 8 mm with an apparent calculus in the common bile duct measuring 5 mm. Also noted 3 liver cysts (all small, maximum 2.3 diameters)and bilateral kidney cysts. 3.2 cm abdominal aorta with aortic atherosclerosis.  After that day, he has been feeling much better, he does still have some intermittent discomfort in the right upper quadrant but is nothing like the initial onset of symptoms. No fevers or jaundice.  He also questions his unintentional weight loss which has been ongoing for the last year or so. He was about 190 2 years ago and is now down in the high 160s to low 170s. His weight loss has stabilized but he notes that he has loss of muscle mass with this. He had a colonoscopy about 2 years ago with a couple polyps but otherwise no concerning findings.  No Known Allergies  Past Medical History:  Diagnosis Date  . Arthritis   . Broken ankle 2008  . Diverticulosis   . ED (erectile dysfunction)   . Facial fracture ([place]) 1979  . GERD (gastroesophageal reflux disease)   . Hearing loss in right ear   . Hemorrhoids   . Hiatal hernia   . Peptic stricture of esophagus   . Peptic ulcer disease 1989  . Prostate cancer ([place]) 02/2009  . Seasonal allergies     Past Surgical History:  Procedure Laterality Date  .  ANKLE SURGERY Left 2008  . INGUINAL HERNIA REPAIR Left   . KNEE ARTHROSCOPY Left 2013  . PROSTATECTOMY  2011  . VASECTOMY      Family History  Problem Relation Age of Onset  . Breast cancer Mother   . Diabetes Mother   . Heart attack Mother   . Alcohol abuse Father   . Stroke Brother   . Colon cancer Neg Hx   . Esophageal cancer Neg Hx   . Rectal cancer Neg Hx   . Stomach cancer Neg Hx     Social History   Socioeconomic History  . Marital status: Married    Spouse name: Not on file  . Number of children: 2  . Years of education: Not on file  . Highest education level: Not on file  Occupational History  . Occupation: Press photographer  Tobacco Use  . Smoking status: Never Smoker  . Smokeless tobacco: Never Used  Vaping Use  . Vaping Use: Never used  Substance and Sexual Activity  . Alcohol use: Yes    Alcohol/week: 12.0 standard drinks    Types: 12 Standard drinks or equivalent per week    Comment: social   . Drug use: No  . Sexual activity: Yes    Partners: Female  Other Topics Concern  . Not on file  Social History Narrative  . Not on file   Social Determinants of Health   Financial Resource Strain:   . Difficulty of  Paying Living Expenses:   Food Insecurity:   . Worried About Charity fundraiser in the Last Year:   . Arboriculturist in the Last Year:   Transportation Needs:   . Film/video editor (Medical):   Marland Kitchen Lack of Transportation (Non-Medical):   Physical Activity:   . Days of Exercise per Week:   . Minutes of Exercise per Session:   Stress:   . Feeling of Stress :   Social Connections:   . Frequency of Communication with Friends and Family:   . Frequency of Social Gatherings with Friends and Family:   . Attends Religious Services:   . Active Member of Clubs or Organizations:   . Attends Archivist Meetings:   Marland Kitchen Marital Status:     Current Outpatient Medications on File Prior to Visit  Medication Sig Dispense Refill  . acetaminophen  (TYLENOL) 325 MG tablet Take 650 mg by mouth every 6 (six) hours as needed for moderate pain.    Marland Kitchen aspirin EC 81 MG tablet Take 81 mg by mouth daily.    Marland Kitchen atorvastatin (LIPITOR) 40 MG tablet Take 40 mg by mouth daily.    Hinda Kehr 30 MG/ACT SOLN     . azelastine (ASTELIN) 0.1 % nasal spray Place 1 spray into both nostrils 2 (two) times daily. Use in each nostril as directed    . CIALIS 5 MG tablet Take 5 mg by mouth daily.     . diphenhydrAMINE (BENADRYL) 25 MG tablet Take 25 mg by mouth daily as needed.      Marland Kitchen esomeprazole (NEXIUM) 40 MG capsule TAKE 1 CAPSULE BY MOUTH EVERY DAY BEFORE BREAKFAST 90 capsule 3  . ezetimibe (ZETIA) 10 MG tablet Take 10 mg by mouth daily.    Marland Kitchen gabapentin (NEURONTIN) 300 MG capsule TAKE 1 CAPSULE BY MOUTH EVERY DAY AS DIRECTED-may take up to three times daily (Patient not taking: Reported on 12/27/2019)  1  . ipratropium (ATROVENT) 0.03 % nasal spray Place 2 sprays into both nostrils 2 (two) times daily.    Marland Kitchen levocetirizine (XYZAL) 5 MG tablet Take 5 mg by mouth every evening. (Patient not taking: Reported on 12/27/2019)    . lisinopril (ZESTRIL) 5 MG tablet Take 5 mg by mouth daily.    Marland Kitchen loratadine (CLARITIN) 10 MG tablet Claritin 10 mg tablet    . sildenafil (VIAGRA) 100 MG tablet Take 100 mg by mouth daily as needed for erectile dysfunction.    Marland Kitchen SYNJARDY XR 25-1000 MG TB24      No current facility-administered medications on file prior to visit.    Review of Systems: a complete, 10pt review of systems was completed with pertinent positives and negatives as documented in the HPI  Physical Exam: Vitals  Weight: 169.8 lb Height: 72in Body Surface Area: 1.99 m Body Mass Index: 23.03 kg/m  Temp.: 98.56F  Pulse: 95 (Regular)  BP: 136/82(Sitting, Left Arm, Standard)  Alert and well-appearing Unlabored respirations Abdomen soft, nontender, nondistended No lower extremity edema   CBC Latest Ref Rng & Units 07/11/2010 07/10/2010 07/04/2010  WBC  4.0 - 10.5 K/uL - - 5.6  Hemoglobin 13.0 - 17.0 g/dL 14.2 15.4 15.3  Hematocrit 39 - 52 % 41.5 44.6 43.4  Platelets 150 - 400 K/uL - - 157    CMP Latest Ref Rng & Units 08/23/2019 04/28/2019 01/14/2019  Glucose 65 - 99 mg/dL - 214(H) -  BUN 8 - 27 mg/dL - 11 -  Creatinine 0.76 -  1.27 mg/dL - 0.78 0.90  Sodium 134 - 144 mmol/L - 137 -  Potassium 3.5 - 5.2 mmol/L - 4.9 -  Chloride 96 - 106 mmol/L - 99 -  CO2 20 - 29 mmol/L - 25 -  Calcium 8.6 - 10.2 mg/dL - 9.8 -  Total Protein 6.0 - 8.5 g/dL 7.2 - -  Total Bilirubin 0.0 - 1.2 mg/dL 0.9 - -  Alkaline Phos 39 - 117 IU/L 102 - -  AST 0 - 40 IU/L 18 - -  ALT 0 - 44 IU/L 20 - -    Lab Results  Component Value Date   INR 1.0 01/17/2007    Imaging: No results found.   A/P: BILIARY COLIC (Y38.88) CHOLEDOCHOLITHIASIS (K80.50) Story: I recommend proceeding with laparoscopic cholecystectomy with intraoperative cholangiogram. Discussed risks of surgery including bleeding, pain, scarring, intraabdominal injury specifically to the common bile duct and sequelae, conversion to open surgery, failure to resolve symptoms, blood clots/ pulmonary embolus, heart attack, pneumonia, stroke, death. If persistent choledocholithiasis as identified on cholangiogram, he will need to undergo ERCP. If the stone has passed, we will plan for this to be an outpatient procedure. Questions welcomed and answered to patient's satisfaction. If ongoing weight loss postoperatively, we discussed further imaging such as CT or MRI to rule out any other intra-abdominal pathology. Discussed signs and symptoms which should prompt him to seek more urgent treatment.      Patient Active Problem List   Diagnosis Date Noted  . Erectile dysfunction 09/15/2019  . Coronary artery disease due to lipid rich plaque 05/20/2019  . Coronary artery calcification seen on CT scan 05/20/2019  . Type 2 diabetes mellitus without obesity ([place]) 03/25/2019  . PVC's (premature ventricular  contractions) 03/25/2019  . Elevated blood pressure reading in office with white coat syndrome, without diagnosis of hypertension 03/25/2019  . Pure hypercholesterolemia 03/25/2019  . History of esophageal stricture 02/01/2015  . Special screening for malignant neoplasms, colon 10/15/2010  . HEMORRHOIDS-EXTERNAL 09/15/2008  . HEMORRHOIDS 09/15/2008  . GERD 09/15/2008       Romana Juniper, MD [facility]  See AMION to contact appropriate on-call provider

## 2020-01-13 NOTE — H&P (Signed)
Surgical Evaluation   HPI: 64 year old man with history of knee arthritis and prostate cancer status post prostatectomy and type 2 diabetes who is referred by [facility] where he was seen on 6/14 when he presented with a 2 day history postprandial right upper quadrant pain. This is associated with bloating and alternating between sharp and dull. This initiated after eating a hamburger and other kinds of fast food, waxing and waning reaching a level of 8 out of 10. Some radiation to the back and aggravation by inspiration. No nausea or emesis.  Ultrasound was then performed on 6/15 demonstrating cholelithiasis and sludge without sonographic signs of cholecystitis. Common bile duct 8 mm with an apparent calculus in the common bile duct measuring 5 mm. Also noted 3 liver cysts (all small, maximum 2.3 diameters)and bilateral kidney cysts. 3.2 cm abdominal aorta with aortic atherosclerosis.  After that day, he has been feeling much better, he does still have some intermittent discomfort in the right upper quadrant but is nothing like the initial onset of symptoms. No fevers or jaundice.  He also questions his unintentional weight loss which has been ongoing for the last year or so. He was about 190 2 years ago and is now down in the high 160s to low 170s. His weight loss has stabilized but he notes that he has loss of muscle mass with this. He had a colonoscopy about 2 years ago with a couple polyps but otherwise no concerning findings.  No Known Allergies  Past Medical History:  Diagnosis Date  . Arthritis   . Broken ankle 2008  . Diverticulosis   . ED (erectile dysfunction)   . Facial fracture ([place]) 1979  . GERD (gastroesophageal reflux disease)   . Hearing loss in right ear   . Hemorrhoids   . Hiatal hernia   . Peptic stricture of esophagus   . Peptic ulcer disease 1989  . Prostate cancer ([place]) 02/2009  . Seasonal allergies     Past Surgical History:  Procedure Laterality Date  .  ANKLE SURGERY Left 2008  . INGUINAL HERNIA REPAIR Left   . KNEE ARTHROSCOPY Left 2013  . PROSTATECTOMY  2011  . VASECTOMY      Family History  Problem Relation Age of Onset  . Breast cancer Mother   . Diabetes Mother   . Heart attack Mother   . Alcohol abuse Father   . Stroke Brother   . Colon cancer Neg Hx   . Esophageal cancer Neg Hx   . Rectal cancer Neg Hx   . Stomach cancer Neg Hx     Social History   Socioeconomic History  . Marital status: Married    Spouse name: Not on file  . Number of children: 2  . Years of education: Not on file  . Highest education level: Not on file  Occupational History  . Occupation: Press photographer  Tobacco Use  . Smoking status: Never Smoker  . Smokeless tobacco: Never Used  Vaping Use  . Vaping Use: Never used  Substance and Sexual Activity  . Alcohol use: Yes    Alcohol/week: 12.0 standard drinks    Types: 12 Standard drinks or equivalent per week    Comment: social   . Drug use: No  . Sexual activity: Yes    Partners: Female  Other Topics Concern  . Not on file  Social History Narrative  . Not on file   Social Determinants of Health   Financial Resource Strain:   . Difficulty of  Paying Living Expenses:   Food Insecurity:   . Worried About Charity fundraiser in the Last Year:   . Arboriculturist in the Last Year:   Transportation Needs:   . Film/video editor (Medical):   Marland Kitchen Lack of Transportation (Non-Medical):   Physical Activity:   . Days of Exercise per Week:   . Minutes of Exercise per Session:   Stress:   . Feeling of Stress :   Social Connections:   . Frequency of Communication with Friends and Family:   . Frequency of Social Gatherings with Friends and Family:   . Attends Religious Services:   . Active Member of Clubs or Organizations:   . Attends Archivist Meetings:   Marland Kitchen Marital Status:     Current Outpatient Medications on File Prior to Visit  Medication Sig Dispense Refill  . acetaminophen  (TYLENOL) 325 MG tablet Take 650 mg by mouth every 6 (six) hours as needed for moderate pain.    Marland Kitchen aspirin EC 81 MG tablet Take 81 mg by mouth daily.    Marland Kitchen atorvastatin (LIPITOR) 40 MG tablet Take 40 mg by mouth daily.    Hinda Kehr 30 MG/ACT SOLN     . azelastine (ASTELIN) 0.1 % nasal spray Place 1 spray into both nostrils 2 (two) times daily. Use in each nostril as directed    . CIALIS 5 MG tablet Take 5 mg by mouth daily.     . diphenhydrAMINE (BENADRYL) 25 MG tablet Take 25 mg by mouth daily as needed.      Marland Kitchen esomeprazole (NEXIUM) 40 MG capsule TAKE 1 CAPSULE BY MOUTH EVERY DAY BEFORE BREAKFAST 90 capsule 3  . ezetimibe (ZETIA) 10 MG tablet Take 10 mg by mouth daily.    Marland Kitchen gabapentin (NEURONTIN) 300 MG capsule TAKE 1 CAPSULE BY MOUTH EVERY DAY AS DIRECTED-may take up to three times daily (Patient not taking: Reported on 12/27/2019)  1  . ipratropium (ATROVENT) 0.03 % nasal spray Place 2 sprays into both nostrils 2 (two) times daily.    Marland Kitchen levocetirizine (XYZAL) 5 MG tablet Take 5 mg by mouth every evening. (Patient not taking: Reported on 12/27/2019)    . lisinopril (ZESTRIL) 5 MG tablet Take 5 mg by mouth daily.    Marland Kitchen loratadine (CLARITIN) 10 MG tablet Claritin 10 mg tablet    . sildenafil (VIAGRA) 100 MG tablet Take 100 mg by mouth daily as needed for erectile dysfunction.    Marland Kitchen SYNJARDY XR 25-1000 MG TB24      No current facility-administered medications on file prior to visit.    Review of Systems: a complete, 10pt review of systems was completed with pertinent positives and negatives as documented in the HPI  Physical Exam: Vitals  Weight: 169.8 lb Height: 72in Body Surface Area: 1.99 m Body Mass Index: 23.03 kg/m  Temp.: 98.81F  Pulse: 95 (Regular)  BP: 136/82(Sitting, Left Arm, Standard)  Alert and well-appearing Unlabored respirations Abdomen soft, nontender, nondistended No lower extremity edema   CBC Latest Ref Rng & Units 07/11/2010 07/10/2010 07/04/2010  WBC  4.0 - 10.5 K/uL - - 5.6  Hemoglobin 13.0 - 17.0 g/dL 14.2 15.4 15.3  Hematocrit 39 - 52 % 41.5 44.6 43.4  Platelets 150 - 400 K/uL - - 157    CMP Latest Ref Rng & Units 08/23/2019 04/28/2019 01/14/2019  Glucose 65 - 99 mg/dL - 214(H) -  BUN 8 - 27 mg/dL - 11 -  Creatinine 0.76 -  1.27 mg/dL - 0.78 0.90  Sodium 134 - 144 mmol/L - 137 -  Potassium 3.5 - 5.2 mmol/L - 4.9 -  Chloride 96 - 106 mmol/L - 99 -  CO2 20 - 29 mmol/L - 25 -  Calcium 8.6 - 10.2 mg/dL - 9.8 -  Total Protein 6.0 - 8.5 g/dL 7.2 - -  Total Bilirubin 0.0 - 1.2 mg/dL 0.9 - -  Alkaline Phos 39 - 117 IU/L 102 - -  AST 0 - 40 IU/L 18 - -  ALT 0 - 44 IU/L 20 - -    Lab Results  Component Value Date   INR 1.0 01/17/2007    Imaging: No results found.   A/P: BILIARY COLIC (M25.00) CHOLEDOCHOLITHIASIS (K80.50) Story: I recommend proceeding with laparoscopic cholecystectomy with intraoperative cholangiogram. Discussed risks of surgery including bleeding, pain, scarring, intraabdominal injury specifically to the common bile duct and sequelae, conversion to open surgery, failure to resolve symptoms, blood clots/ pulmonary embolus, heart attack, pneumonia, stroke, death. If persistent choledocholithiasis as identified on cholangiogram, he will need to undergo ERCP. If the stone has passed, we will plan for this to be an outpatient procedure. Questions welcomed and answered to patient's satisfaction. If ongoing weight loss postoperatively, we discussed further imaging such as CT or MRI to rule out any other intra-abdominal pathology. Discussed signs and symptoms which should prompt him to seek more urgent treatment.      Patient Active Problem List   Diagnosis Date Noted  . Erectile dysfunction 09/15/2019  . Coronary artery disease due to lipid rich plaque 05/20/2019  . Coronary artery calcification seen on CT scan 05/20/2019  . Type 2 diabetes mellitus without obesity ([place]) 03/25/2019  . PVC's (premature ventricular  contractions) 03/25/2019  . Elevated blood pressure reading in office with white coat syndrome, without diagnosis of hypertension 03/25/2019  . Pure hypercholesterolemia 03/25/2019  . History of esophageal stricture 02/01/2015  . Special screening for malignant neoplasms, colon 10/15/2010  . HEMORRHOIDS-EXTERNAL 09/15/2008  . HEMORRHOIDS 09/15/2008  . GERD 09/15/2008       Romana Juniper, MD [facility]  See AMION to contact appropriate on-call provider

## 2020-01-15 IMAGING — CT CT HEART MORP W/ CTA COR W/ SCORE W/ CA W/CM &/OR W/O CM
4 of 7 series · 8 of 20 positions shown, 9 images · IV contrast (APPLIED)
Comparison: None.
COMPARISON: None.

Addendum:
EXAM:
OVER-READ INTERPRETATION  CT CHEST

The following report is an over-read performed by radiologist Dr.
Rochell Nagel [REDACTED] on 05/18/2019. This
over-read does not include interpretation of cardiac or coronary
anatomy or pathology. The coronary CTA interpretation by the
cardiologist is attached.
CLINICAL DATA: 63 male with chest pain
Cardiac/Coronary  CT
TECHNIQUE: The patient was scanned on a [employer] Force scanner.

[Series 6: best diast 75 % · axial · 0.39mm/px · z∈[+1127,+1169]mm · 2 of 313 slices shown]
[im 105/313  vessel]
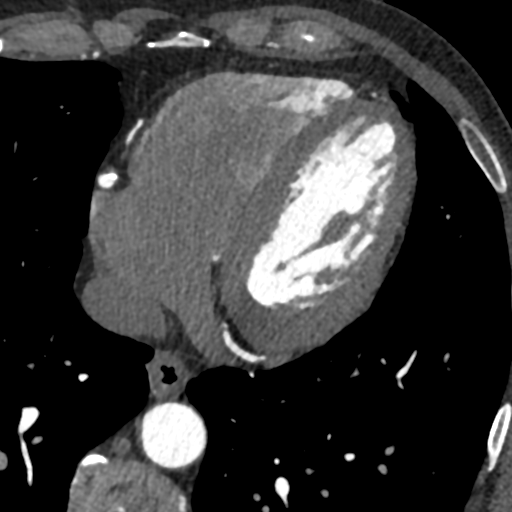
[im 209/313  vessel]
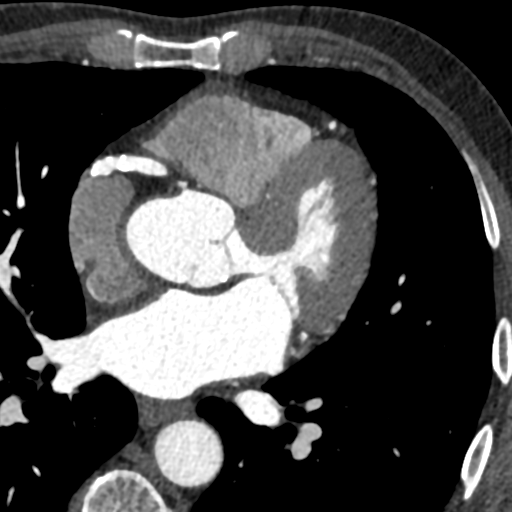

[Series 7: best syst · axial · 0.39mm/px · z∈[+1127,+1169]mm · 2 of 313 slices shown, 3 images]
[im 105/313  vessel]
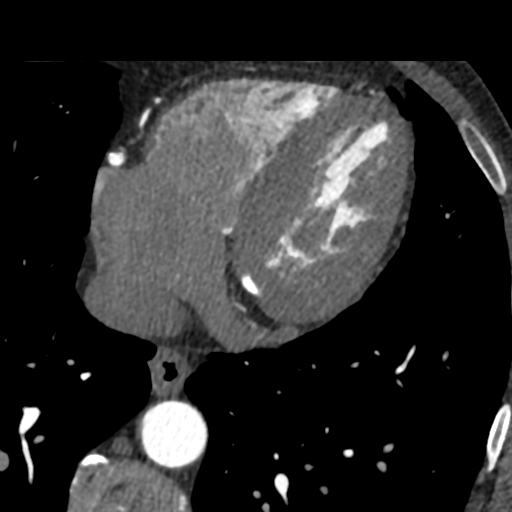
[im 105/313  lung]
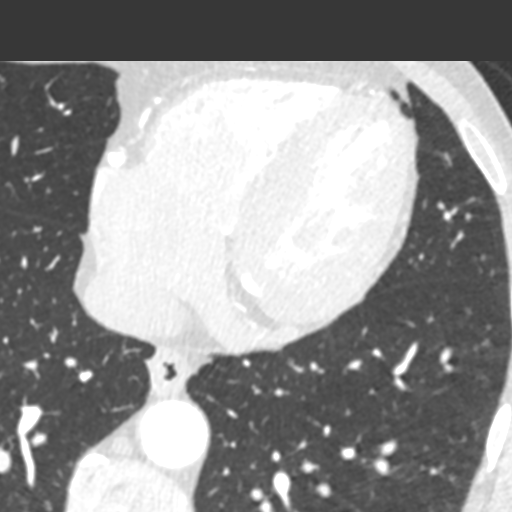
[im 209/313  vessel]
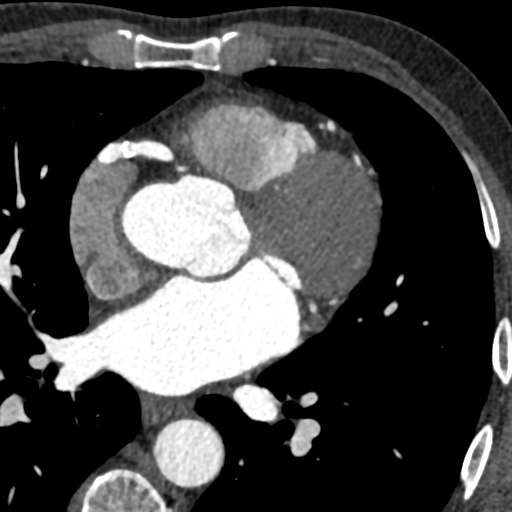

[Series 8: ts diast sharp 75 % · axial · 0.39mm/px · z∈[+1127,+1169]mm · 2 of 313 slices shown]
[im 105/313  lung]
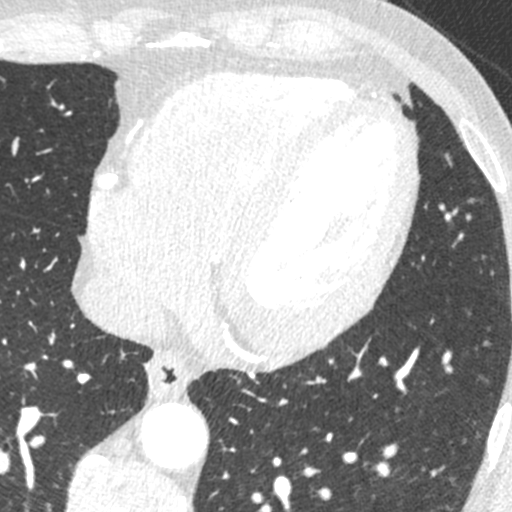
[im 209/313  lung]
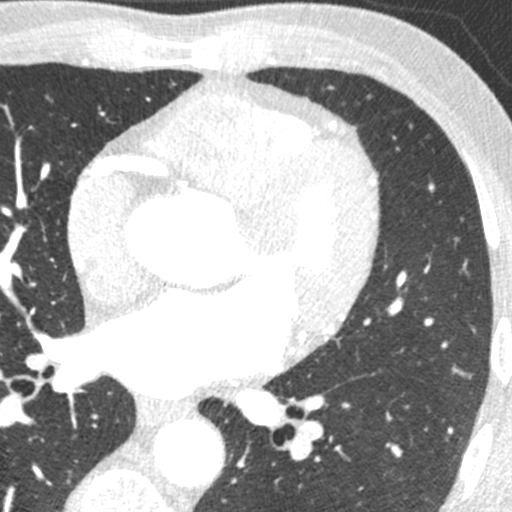

[Series 9: ts syst sharp · axial · 0.39mm/px · z∈[+1127,+1169]mm · 2 of 313 slices shown]
[im 105/313  lung]
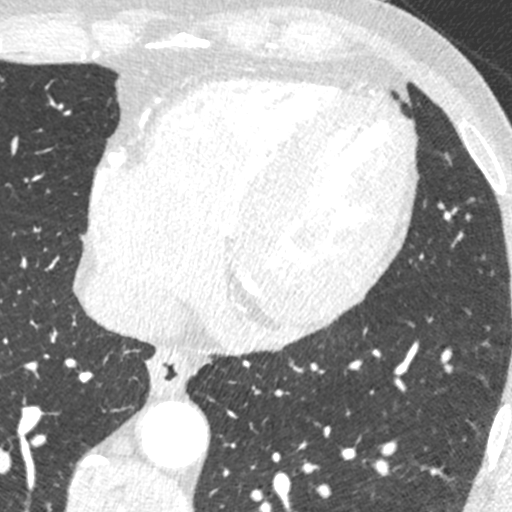
[im 209/313  lung]
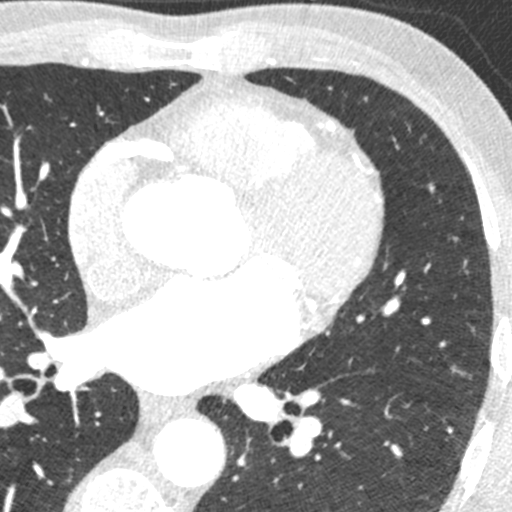

[8 of 20 positions shown; findings below may reference images not displayed]

FINDINGS: Limited view of the lung parenchyma demonstrates no suspicious
nodularity. Airways are normal.

Limited view of the mediastinum demonstrates no adenopathy.
Esophagus normal.

Limited view of the upper abdomen unremarkable.

Limited view of the skeleton and chest wall is unremarkable.
IMPRESSION: No significant extracardiac findings.
FINDINGS: A 120 kV prospective scan was triggered in the descending thoracic
aorta at 111 HU's. Axial non-contrast 3 mm slices were carried out
through the heart. The data set was analyzed on a dedicated work
station and scored using the Agatson method. Gantry rotation speed
was 250 msecs and collimation was .6 mm. No beta blockade and 0.8 mg
of sl NTG was given. The 3D data set was reconstructed in 5%
intervals of the 67-82 % of the R-R cycle. Diastolic phases were
analyzed on a dedicated work station using MPR, MIP and VRT modes.
The patient received 80 cc of contrast.

Aorta: Mildly dilated aortic root (42 mm). Minimal calcifications.
No dissection.

Aortic Valve:  Trileaflet.  No calcifications.

Coronary Arteries:  Normal coronary origin.  Right dominance.

RCA is a large dominant artery that gives rise to PDA and PLVB.
There is diffuse minimal calcified plaque (0-24%); mild calcified
plaque (25-49%) at takeoff of PDA. Minimal plaque (0-24%) in
posterolateral.

Left main is a large artery that gives rise to LAD and LCX arteries.
There is no plaque.

LAD is a large vessel that has no plaque with 2 diagonals. There is
calcified plaque in the proximal (50-69%) and mid vessel (0-24%);
there is mild calcified plaque (25-49%) in proximal D1.

LCX is a non-dominant artery that gives rise to small OM1 and OM2.
There is minimal calcified plaque (0-24%) noted.

Other findings:

Normal pulmonary vein drainage into the left atrium.

Normal let atrial appendage without a thrombus.

Mildly dilated pulmonary artery (31 mm).
IMPRESSION: 1. Coronary calcium score of 7710. This was 98 percentile for age
and sex matched control.

2. Normal coronary origin with right dominance.

3. Diffuse nonobstructive 3 vessel calcified plaque; 50-69% stenosis
in proximal LAD; 25-49% stenosis proximal D1 and in RCA at takeoff
of PDA. [id number].

Barus Aljuffry

*** End of Addendum ***
EXAM:
OVER-READ INTERPRETATION  CT CHEST

The following report is an over-read performed by radiologist Dr.
Rochell Nagel [REDACTED] on 05/18/2019. This
over-read does not include interpretation of cardiac or coronary
anatomy or pathology. The coronary CTA interpretation by the
cardiologist is attached.
FINDINGS: Limited view of the lung parenchyma demonstrates no suspicious
nodularity. Airways are normal.

Limited view of the mediastinum demonstrates no adenopathy.
Esophagus normal.

Limited view of the upper abdomen unremarkable.

Limited view of the skeleton and chest wall is unremarkable.
IMPRESSION: No significant extracardiac findings.

## 2020-01-26 NOTE — Pre-Procedure Instructions (Signed)
[facility] [place] AT [address] to [address]istered [place] [address] Phone: [phone] Fax: [phone]  [id number] [place], [place] - [address] [place] [address] Phone: [phone] Fax: [phone]      Your procedure is scheduled on Monday July 19th.  Report to [facility] Main Entrance "A" at 8:00 A.M., and check in at the Admitting office.  Call this number if you have problems the morning of surgery:  [phone]  Call [phone] if you have any questions prior to your surgery date Monday-Friday 8am-4pm    Remember:  Do not eat or drink after midnight the night before your surgery     Take these medicines the morning of surgery with A SIP OF WATER  azelastine (ASTELIN) esomeprazole (NEXIUM) ipratropium (ATROVENT) loratadine (CLARITIN) acetaminophen (TYLENOL)-if needed  As of today, STOP taking any Aspirin (unless otherwise instructed by your surgeon) Aleve, Naproxen, Ibuprofen, Motrin, Advil, Goody's, BC's, all herbal medications, fish oil, and all vitamins.   HOW TO MANAGE YOUR DIABETES BEFORE AND AFTER SURGERY  Why is it important to control my blood sugar before and after surgery? . Improving blood sugar levels before and after surgery helps healing and can limit problems. . A way of improving blood sugar control is eating a healthy diet by: o  Eating less sugar and carbohydrates o  Increasing activity/exercise o  Talking with your doctor about reaching your blood sugar goals . High blood sugars (greater than 180 mg/dL) can raise your risk of infections and slow your recovery, so you will need to focus on controlling your diabetes during the weeks before surgery. . Make sure that the doctor who takes care of your diabetes knows about your planned surgery including the date and location.  How do I manage  my blood sugar before surgery? . Check your blood sugar at least 4 times a day, starting 2 days before surgery, to make sure that the level is not too high or low. o Check your blood sugar the morning of your surgery when you wake up and every 2 hours until you get to the Short Stay unit. . If your blood sugar is less than 70 mg/dL, you will need to treat for low blood sugar: o Do not take insulin. o Treat a low blood sugar (less than 70 mg/dL) with  cup of clear juice (cranberry or apple), 4 glucose tablets, OR glucose gel. Recheck blood sugar in 15 minutes after treatment (to make sure it is greater than 70 mg/dL). If your blood sugar is not greater than 70 mg/dL on recheck, call [phone] o  for further instructions. . Report your blood sugar to the short stay nurse when you get to Short Stay.  . If you are admitted to the hospital after surgery: o Your blood sugar will be checked by the staff and you will probably be given insulin after surgery (instead of oral diabetes medicines) to make sure you have good blood sugar levels. o The goal for blood sugar control after surgery is 80-180 mg/dL.     WHAT DO I DO ABOUT MY DIABETES MEDICATION?   Marland Kitchen Do not take oral diabetes medicines (pills): SYNJARDY XR 25-1000 MG TB24 the day before or the morning of surgery.              Do not wear jewelry, make  up, or nail polish            Do not wear lotions, powders, perfumes/colognes, or deodorant.            Do not shave 48 hours prior to surgery.  Men may shave face and neck.            Do not bring valuables to the hospital.            [facility] is not responsible for any belongings or valuables.  Do NOT Smoke (Tobacco/Vaping) or drink Alcohol 24 hours prior to your procedure If you use a CPAP at night, you may bring all equipment for your overnight stay.   Contacts, glasses, dentures or bridgework may not be worn into surgery.      For patients admitted to the hospital, discharge time  will be determined by your treatment team.   Patients discharged the day of surgery will not be allowed to drive home, and someone needs to stay with them for 24 hours.    Special instructions:   [place]- Preparing For Surgery  Before surgery, you can play an important role. Because skin is not sterile, your skin needs to be as free of germs as possible. You can reduce the number of germs on your skin by washing with CHG (chlorahexidine gluconate) Soap before surgery.  CHG is an antiseptic cleaner which kills germs and bonds with the skin to continue killing germs even after washing.    Oral Hygiene is also important to reduce your risk of infection.  Remember - BRUSH YOUR TEETH THE MORNING OF SURGERY WITH YOUR REGULAR TOOTHPASTE  Please do not use if you have an allergy to CHG or antibacterial soaps. If your skin becomes reddened/irritated stop using the CHG.  Do not shave (including legs and underarms) for at least 48 hours prior to first CHG shower. It is OK to shave your face.  Please follow these instructions carefully.   1. Shower the NIGHT BEFORE SURGERY and the MORNING OF SURGERY with CHG Soap.   2. If you chose to wash your hair, wash your hair first as usual with your normal shampoo.  3. After you shampoo, rinse your hair and body thoroughly to remove the shampoo.  4. Use CHG as you would any other liquid soap. You can apply CHG directly to the skin and wash gently with a scrungie or a clean washcloth.   5. Apply the CHG Soap to your body ONLY FROM THE NECK DOWN.  Do not use on open wounds or open sores. Avoid contact with your eyes, ears, mouth and genitals (private parts). Wash Face and genitals (private parts)  with your normal soap.   6. Wash thoroughly, paying special attention to the area where your surgery will be performed.  7. Thoroughly rinse your body with warm water from the neck down.  8. DO NOT shower/wash with your normal soap after using and rinsing off  the CHG Soap.  9. Pat yourself dry with a CLEAN TOWEL.  10. Wear CLEAN PAJAMAS to bed the night before surgery  11. Place CLEAN SHEETS on your bed the night of your first shower and DO NOT SLEEP WITH PETS.   Day of Surgery: Wear Clean/Comfortable clothing the morning of surgery Do not apply any deodorants/lotions.   Remember to brush your teeth WITH YOUR REGULAR TOOTHPASTE.   Please read over the following fact sheets that you were given.

## 2020-01-27 ENCOUNTER — Encounter (HOSPITAL_COMMUNITY)
Admission: RE | Admit: 2020-01-27 | Discharge: 2020-01-27 | Disposition: A | Discharge status: Home / Self Care | Payer: BC Managed Care – PPO | Source: Ambulatory Visit | Attending: Surgery | Admitting: Surgery

## 2020-01-27 ENCOUNTER — Encounter (HOSPITAL_COMMUNITY): Payer: Self-pay

## 2020-01-27 ENCOUNTER — Telehealth: Payer: Self-pay | Admitting: *Deleted

## 2020-01-27 ENCOUNTER — Other Ambulatory Visit (HOSPITAL_COMMUNITY)
Admission: RE | Admit: 2020-01-27 | Discharge: 2020-01-27 | Disposition: A | Discharge status: Home / Self Care | Payer: BC Managed Care – PPO | Source: Ambulatory Visit | Attending: Surgery | Admitting: Surgery

## 2020-01-27 ENCOUNTER — Other Ambulatory Visit: Payer: Self-pay

## 2020-01-27 DIAGNOSIS — Z01812 Encounter for preprocedural laboratory examination: Secondary | ICD-10-CM | POA: Insufficient documentation

## 2020-01-27 DIAGNOSIS — Z20822 Contact with and (suspected) exposure to covid-19: Secondary | ICD-10-CM | POA: Insufficient documentation

## 2020-01-27 LAB — CBC WITH DIFFERENTIAL/PLATELET
Abs Immature Granulocytes: 0.03 10*3/uL (ref 0.00–0.07)
Basophils Absolute: 0 10*3/uL (ref 0.0–0.1)
Basophils Relative: 1 %
Eosinophils Absolute: 0.1 10*3/uL (ref 0.0–0.5)
Eosinophils Relative: 1 %
HCT: 45.6 % (ref 39.0–52.0)
Hemoglobin: 15 g/dL (ref 13.0–17.0)
Immature Granulocytes: 1 %
Lymphocytes Relative: 26 %
Lymphs Abs: 1.4 10*3/uL (ref 0.7–4.0)
MCH: 31.5 pg (ref 26.0–34.0)
MCHC: 32.9 g/dL (ref 30.0–36.0)
MCV: 95.8 fL (ref 80.0–100.0)
Monocytes Absolute: 0.5 10*3/uL (ref 0.1–1.0)
Monocytes Relative: 9 %
Neutro Abs: 3.4 10*3/uL (ref 1.7–7.7)
Neutrophils Relative %: 62 %
Platelets: 169 10*3/uL (ref 150–400)
RBC: 4.76 MIL/uL (ref 4.22–5.81)
RDW: 12.2 % (ref 11.5–15.5)
WBC: 5.4 10*3/uL (ref 4.0–10.5)
nRBC: 0 % (ref 0.0–0.2)

## 2020-01-27 LAB — COMPREHENSIVE METABOLIC PANEL
ALT: 27 U/L (ref 0–44)
AST: 23 U/L (ref 15–41)
Albumin: 4 g/dL (ref 3.5–5.0)
Alkaline Phosphatase: 75 U/L (ref 38–126)
Anion gap: 7 (ref 5–15)
BUN: 13 mg/dL (ref 8–23)
CO2: 25 mmol/L (ref 22–32)
Calcium: 9.2 mg/dL (ref 8.9–10.3)
Chloride: 104 mmol/L (ref 98–111)
Creatinine, Ser: 0.74 mg/dL (ref 0.61–1.24)
GFR calc Af Amer: >60 mL/min (ref >60)
GFR calc non Af Amer: >60 mL/min (ref >60)
Glucose, Bld: 137 mg/dL — ABNORMAL HIGH (ref 70–99)
Potassium: 4.3 mmol/L (ref 3.5–5.1)
Sodium: 136 mmol/L (ref 135–145)
Total Bilirubin: 0.8 mg/dL (ref 0.3–1.2)
Total Protein: 6.4 g/dL — ABNORMAL LOW (ref 6.5–8.1)

## 2020-01-27 LAB — GLUCOSE, CAPILLARY: Glucose-Capillary: 150 mg/dL — ABNORMAL HIGH (ref 70–99)

## 2020-01-27 LAB — SARS CORONAVIRUS 2 (TAT 6-24 HRS): SARS Coronavirus 2: NEGATIVE

## 2020-01-27 LAB — HEMOGLOBIN A1C
Hgb A1c MFr Bld: 6.2 % — ABNORMAL HIGH (ref 4.8–5.6)
Mean Plasma Glucose: 131.24 mg/dL

## 2020-01-27 NOTE — Telephone Encounter (Signed)
A detailed message was left,re: his follow up visit. °

## 2020-01-27 NOTE — Progress Notes (Signed)
PCP - Dr. Shelia Media Cardiologist - Dr. Harrell Gave  EKG - 12/08/19 Stress Test - denies ECHO - denies Cardiac Cath - denies  Sleep Study - 2018- negative for OSA CPAP - does not use  Fasting Blood Sugar - 100-120 Checks Blood Sugar 2-3 x weekly  Aspirin Instructions: Patient instructed to hold all Aspirin, NSAID's, herbal medications, fish oil and vitamins 7 days prior to surgery.   ERAS Protcol - N/A PRE-SURGERY Ensure or G2- N/A  COVID TEST- 01/27/20- results pending   Anesthesia review:   Patient denies shortness of breath, fever, cough and chest pain at PAT appointment   All instructions explained to the patient, with a verbal understanding of the material. Patient agrees to go over the instructions while at home for a better understanding. Patient also instructed to self quarantine after being tested for COVID-19. The opportunity to ask questions was provided.

## 2020-01-30 MED ORDER — BUPIVACAINE LIPOSOME 1.3 % IJ SUSP
20.0000 mL | Freq: Once | INTRAMUSCULAR | Status: DC
Start: 2020-01-31 — End: 2020-01-30

## 2020-01-30 MED ORDER — BUPIVACAINE LIPOSOME 1.3 % IJ SUSP: 20.0000 mL | Freq: Once | INTRAMUSCULAR | Status: DC

## 2020-01-31 ENCOUNTER — Ambulatory Visit (HOSPITAL_COMMUNITY)
Admission: RE | Admit: 2020-01-31 | Discharge: 2020-01-31 | Disposition: A | Discharge status: Home / Self Care | Payer: BC Managed Care – PPO | Source: Ambulatory Visit | Attending: Surgery | Admitting: Surgery

## 2020-01-31 ENCOUNTER — Other Ambulatory Visit: Payer: Self-pay

## 2020-01-31 ENCOUNTER — Ambulatory Visit (HOSPITAL_COMMUNITY): Payer: BC Managed Care – PPO | Admitting: Physician Assistant

## 2020-01-31 ENCOUNTER — Ambulatory Visit (HOSPITAL_COMMUNITY): Payer: BC Managed Care – PPO | Admitting: Registered Nurse

## 2020-01-31 ENCOUNTER — Ambulatory Visit (HOSPITAL_COMMUNITY): Payer: BC Managed Care – PPO

## 2020-01-31 ENCOUNTER — Encounter (HOSPITAL_COMMUNITY)
Admission: RE | Disposition: A | Discharge status: Home / Self Care | Payer: Self-pay | Source: Ambulatory Visit | Attending: Surgery

## 2020-01-31 ENCOUNTER — Encounter (HOSPITAL_COMMUNITY): Payer: Self-pay | Admitting: Surgery

## 2020-01-31 DIAGNOSIS — N529 Male erectile dysfunction, unspecified: Secondary | ICD-10-CM | POA: Diagnosis not present

## 2020-01-31 DIAGNOSIS — Z419 Encounter for procedure for purposes other than remedying health state, unspecified: Secondary | ICD-10-CM

## 2020-01-31 DIAGNOSIS — E7800 Pure hypercholesterolemia, unspecified: Secondary | ICD-10-CM | POA: Insufficient documentation

## 2020-01-31 DIAGNOSIS — Z8546 Personal history of malignant neoplasm of prostate: Secondary | ICD-10-CM | POA: Diagnosis not present

## 2020-01-31 DIAGNOSIS — K5790 Diverticulosis of intestine, part unspecified, without perforation or abscess without bleeding: Secondary | ICD-10-CM | POA: Diagnosis not present

## 2020-01-31 DIAGNOSIS — Z7982 Long term (current) use of aspirin: Secondary | ICD-10-CM | POA: Diagnosis not present

## 2020-01-31 DIAGNOSIS — K811 Chronic cholecystitis: Secondary | ICD-10-CM | POA: Insufficient documentation

## 2020-01-31 DIAGNOSIS — I700 Atherosclerosis of aorta: Secondary | ICD-10-CM | POA: Insufficient documentation

## 2020-01-31 DIAGNOSIS — J449 Chronic obstructive pulmonary disease, unspecified: Secondary | ICD-10-CM | POA: Diagnosis not present

## 2020-01-31 DIAGNOSIS — Z79899 Other long term (current) drug therapy: Secondary | ICD-10-CM | POA: Insufficient documentation

## 2020-01-31 DIAGNOSIS — E119 Type 2 diabetes mellitus without complications: Secondary | ICD-10-CM | POA: Diagnosis not present

## 2020-01-31 DIAGNOSIS — Z9079 Acquired absence of other genital organ(s): Secondary | ICD-10-CM | POA: Insufficient documentation

## 2020-01-31 DIAGNOSIS — Z7984 Long term (current) use of oral hypoglycemic drugs: Secondary | ICD-10-CM | POA: Insufficient documentation

## 2020-01-31 DIAGNOSIS — M1710 Unilateral primary osteoarthritis, unspecified knee: Secondary | ICD-10-CM | POA: Insufficient documentation

## 2020-01-31 DIAGNOSIS — K219 Gastro-esophageal reflux disease without esophagitis: Secondary | ICD-10-CM | POA: Insufficient documentation

## 2020-01-31 HISTORY — PX: CHOLECYSTECTOMY: SHX55

## 2020-01-31 LAB — GLUCOSE, CAPILLARY
Glucose-Capillary: 96 mg/dL (ref 70–99)
Glucose-Capillary: 186 mg/dL — ABNORMAL HIGH (ref 70–99)

## 2020-01-31 SURGERY — LAPAROSCOPIC CHOLECYSTECTOMY WITH INTRAOPERATIVE CHOLANGIOGRAM
Anesthesia: General | Site: Abdomen

## 2020-01-31 MED ORDER — FENTANYL CITRATE (PF) 250 MCG/5ML IJ SOLN
INTRAMUSCULAR | Status: DC | PRN
  Administered 2020-01-31 (×2): 50 ug via INTRAVENOUS
  Administered 2020-01-31: 150 ug via INTRAVENOUS

## 2020-01-31 MED ORDER — PROPOFOL 10 MG/ML IV BOLUS
INTRAVENOUS | Status: DC | PRN
  Administered 2020-01-31: 100 mg via INTRAVENOUS

## 2020-01-31 MED ORDER — OXYCODONE HCL 5 MG/5ML PO SOLN: 5.0000 mg | Freq: Once | ORAL | Status: DC | PRN

## 2020-01-31 MED ORDER — TRAMADOL HCL 50 MG PO TABS: 50.0000 mg | ORAL_TABLET | Freq: Four times a day (QID) | ORAL | 0 refills | Status: DC | PRN

## 2020-01-31 MED ORDER — MIDAZOLAM HCL 2 MG/2ML IJ SOLN
INTRAMUSCULAR | Status: AC
  Filled 2020-01-31: qty 2

## 2020-01-31 MED ORDER — BUPIVACAINE-EPINEPHRINE (PF) 0.25% -1:200000 IJ SOLN
INTRAMUSCULAR | Status: AC
  Filled 2020-01-31: qty 30

## 2020-01-31 MED ORDER — HYDROMORPHONE HCL 1 MG/ML IJ SOLN
INTRAMUSCULAR | Status: AC
  Administered 2020-01-31: 0.5 mg via INTRAVENOUS
  Filled 2020-01-31: qty 1

## 2020-01-31 MED ORDER — LIDOCAINE 2% (20 MG/ML) 5 ML SYRINGE
INTRAMUSCULAR | Status: AC
  Filled 2020-01-31: qty 5

## 2020-01-31 MED ORDER — DEXAMETHASONE SODIUM PHOSPHATE 10 MG/ML IJ SOLN
INTRAMUSCULAR | Status: AC
  Filled 2020-01-31: qty 1

## 2020-01-31 MED ORDER — CHLORHEXIDINE GLUCONATE 0.12 % MT SOLN
15.0000 mL | Freq: Once | OROMUCOSAL | Status: AC
  Administered 2020-01-31: 15 mL via OROMUCOSAL

## 2020-01-31 MED ORDER — MEPERIDINE HCL 50 MG/ML IJ SOLN: 6.2500 mg | INTRAMUSCULAR | Status: DC | PRN

## 2020-01-31 MED ORDER — SODIUM CHLORIDE 0.9 % IV SOLN: 250.0000 mL | INTRAVENOUS | Status: DC | PRN

## 2020-01-31 MED ORDER — ACETAMINOPHEN 500 MG PO TABS: 1000.0000 mg | ORAL_TABLET | Freq: Once | ORAL | Status: DC

## 2020-01-31 MED ORDER — ONDANSETRON HCL 4 MG/2ML IJ SOLN
INTRAMUSCULAR | Status: DC | PRN
  Administered 2020-01-31: 4 mg via INTRAVENOUS

## 2020-01-31 MED ORDER — PHENYLEPHRINE 40 MCG/ML (10ML) SYRINGE FOR IV PUSH (FOR BLOOD PRESSURE SUPPORT)
PREFILLED_SYRINGE | INTRAVENOUS | Status: DC | PRN
  Administered 2020-01-31: 40 ug via INTRAVENOUS

## 2020-01-31 MED ORDER — SUGAMMADEX SODIUM 200 MG/2ML IV SOLN
INTRAVENOUS | Status: DC | PRN
  Administered 2020-01-31: 200 mg via INTRAVENOUS
  Administered 2020-01-31: 2 mg via INTRAVENOUS

## 2020-01-31 MED ORDER — BUPIVACAINE LIPOSOME 1.3 % IJ SUSP
20.0000 mL | Freq: Once | INTRAMUSCULAR | Status: DC
  Filled 2020-01-31: qty 20

## 2020-01-31 MED ORDER — BUPIVACAINE-EPINEPHRINE 0.25% -1:200000 IJ SOLN
INTRAMUSCULAR | Status: DC | PRN
  Administered 2020-01-31: 30 mL

## 2020-01-31 MED ORDER — CEFAZOLIN SODIUM-DEXTROSE 2-4 GM/100ML-% IV SOLN
2.0000 g | INTRAVENOUS | Status: AC
  Administered 2020-01-31: 2 g via INTRAVENOUS
  Filled 2020-01-31: qty 100

## 2020-01-31 MED ORDER — ACETAMINOPHEN 325 MG PO TABS: 650.0000 mg | ORAL_TABLET | ORAL | Status: DC | PRN

## 2020-01-31 MED ORDER — LACTATED RINGERS IV SOLN: INTRAVENOUS | Status: DC

## 2020-01-31 MED ORDER — LIDOCAINE 2% (20 MG/ML) 5 ML SYRINGE
INTRAMUSCULAR | Status: DC | PRN
  Administered 2020-01-31: 40 mg via INTRAVENOUS

## 2020-01-31 MED ORDER — IOHEXOL 300 MG/ML  SOLN
INTRAMUSCULAR | Status: DC | PRN
  Administered 2020-01-31: 10 mL

## 2020-01-31 MED ORDER — FENTANYL CITRATE (PF) 100 MCG/2ML IJ SOLN: 25.0000 ug | INTRAMUSCULAR | Status: DC | PRN

## 2020-01-31 MED ORDER — MIDAZOLAM HCL 2 MG/2ML IJ SOLN: 0.5000 mg | Freq: Once | INTRAMUSCULAR | Status: DC | PRN

## 2020-01-31 MED ORDER — PROPOFOL 10 MG/ML IV BOLUS
INTRAVENOUS | Status: AC
  Filled 2020-01-31: qty 20

## 2020-01-31 MED ORDER — OXYCODONE HCL 5 MG PO TABS: 5.0000 mg | ORAL_TABLET | Freq: Once | ORAL | Status: DC | PRN

## 2020-01-31 MED ORDER — ROCURONIUM BROMIDE 10 MG/ML (PF) SYRINGE
PREFILLED_SYRINGE | INTRAVENOUS | Status: AC
  Filled 2020-01-31: qty 10

## 2020-01-31 MED ORDER — PHENYLEPHRINE 40 MCG/ML (10ML) SYRINGE FOR IV PUSH (FOR BLOOD PRESSURE SUPPORT)
PREFILLED_SYRINGE | INTRAVENOUS | Status: AC
  Filled 2020-01-31: qty 10

## 2020-01-31 MED ORDER — LACTATED RINGERS IR SOLN
Status: DC | PRN
  Administered 2020-01-31: 1000 mL

## 2020-01-31 MED ORDER — DEXAMETHASONE SODIUM PHOSPHATE 10 MG/ML IJ SOLN
INTRAMUSCULAR | Status: DC | PRN
  Administered 2020-01-31: 5 mg via INTRAVENOUS

## 2020-01-31 MED ORDER — HYDROMORPHONE HCL 1 MG/ML IJ SOLN
0.2500 mg | INTRAMUSCULAR | Status: DC | PRN
  Administered 2020-01-31 (×2): 0.5 mg via INTRAVENOUS

## 2020-01-31 MED ORDER — CHLORHEXIDINE GLUCONATE 4 % EX LIQD: 60.0000 mL | Freq: Once | CUTANEOUS | Status: DC

## 2020-01-31 MED ORDER — FENTANYL CITRATE (PF) 250 MCG/5ML IJ SOLN
INTRAMUSCULAR | Status: AC
  Filled 2020-01-31: qty 5

## 2020-01-31 MED ORDER — ROCURONIUM BROMIDE 10 MG/ML (PF) SYRINGE
PREFILLED_SYRINGE | INTRAVENOUS | Status: DC | PRN
  Administered 2020-01-31: 50 mg via INTRAVENOUS
  Administered 2020-01-31: 20 mg via INTRAVENOUS

## 2020-01-31 MED ORDER — FENTANYL CITRATE (PF) 100 MCG/2ML IJ SOLN
INTRAMUSCULAR | Status: AC
  Filled 2020-01-31: qty 2

## 2020-01-31 MED ORDER — SODIUM CHLORIDE 0.9% FLUSH: 3.0000 mL | Freq: Two times a day (BID) | INTRAVENOUS | Status: DC

## 2020-01-31 MED ORDER — 0.9 % SODIUM CHLORIDE (POUR BTL) OPTIME
TOPICAL | Status: DC | PRN
  Administered 2020-01-31: 1000 mL

## 2020-01-31 MED ORDER — ACETAMINOPHEN 500 MG PO TABS
1000.0000 mg | ORAL_TABLET | ORAL | Status: AC
  Administered 2020-01-31: 1000 mg via ORAL
  Filled 2020-01-31: qty 2

## 2020-01-31 MED ORDER — ONDANSETRON HCL 4 MG/2ML IJ SOLN
INTRAMUSCULAR | Status: AC
  Filled 2020-01-31: qty 2

## 2020-01-31 MED ORDER — PROMETHAZINE HCL 25 MG/ML IJ SOLN: 6.2500 mg | INTRAMUSCULAR | Status: DC | PRN

## 2020-01-31 MED ORDER — ORAL CARE MOUTH RINSE: 15.0000 mL | Freq: Once | OROMUCOSAL | Status: AC

## 2020-01-31 MED ORDER — OXYCODONE HCL 5 MG PO TABS: 5.0000 mg | ORAL_TABLET | ORAL | Status: DC | PRN

## 2020-01-31 MED ORDER — MIDAZOLAM HCL 5 MG/5ML IJ SOLN
INTRAMUSCULAR | Status: DC | PRN
  Administered 2020-01-31: 2 mg via INTRAVENOUS

## 2020-01-31 MED ORDER — BUPIVACAINE LIPOSOME 1.3 % IJ SUSP
INTRAMUSCULAR | Status: DC | PRN
  Administered 2020-01-31: 20 mL

## 2020-01-31 MED ORDER — SODIUM CHLORIDE 0.9% FLUSH: 3.0000 mL | INTRAVENOUS | Status: DC | PRN

## 2020-01-31 MED ORDER — ACETAMINOPHEN 650 MG RE SUPP
650.0000 mg | RECTAL | Status: DC | PRN
  Filled 2020-01-31: qty 1

## 2020-01-31 SURGICAL SUPPLY — 44 items
ADH SKN CLS APL DERMABOND .7 (GAUZE/BANDAGES/DRESSINGS) ×1
APL PRP STRL LF DISP 70% ISPRP (MISCELLANEOUS) ×1
APPLIER CLIP ROT 10 11.4 M/L (STAPLE) ×2
APR CLP MED LRG 11.4X10 (STAPLE) ×1
BAG SPEC RTRVL LRG 6X4 10 (ENDOMECHANICALS) ×1
CABLE HIGH FREQUENCY MONO STRZ (ELECTRODE) ×2 IMPLANT
CHLORAPREP W/TINT 26 (MISCELLANEOUS) ×2 IMPLANT
CLIP APPLIE ROT 10 11.4 M/L (STAPLE) ×1 IMPLANT
COVER MAYO STAND STRL (DRAPES) ×1 IMPLANT
COVER SURGICAL LIGHT HANDLE (MISCELLANEOUS) ×2 IMPLANT
COVER WAND RF STERILE (DRAPES) IMPLANT
DECANTER SPIKE VIAL GLASS SM (MISCELLANEOUS) ×2 IMPLANT
DERMABOND ADVANCED (GAUZE/BANDAGES/DRESSINGS) ×1
DERMABOND ADVANCED .7 DNX12 (GAUZE/BANDAGES/DRESSINGS) ×1 IMPLANT
DRAPE C-ARM 42X120 X-RAY (DRAPES) ×1 IMPLANT
ELECT REM PT RETURN 15FT ADLT (MISCELLANEOUS) ×2 IMPLANT
GLOVE BIO SURGEON STRL SZ 6 (GLOVE) ×2 IMPLANT
GLOVE BIOGEL PI IND STRL 6.5 (GLOVE) IMPLANT
GLOVE BIOGEL PI IND STRL 7.5 (GLOVE) IMPLANT
GLOVE BIOGEL PI INDICATOR 6.5 (GLOVE) ×1
GLOVE BIOGEL PI INDICATOR 7.5 (GLOVE) ×2
GLOVE INDICATOR 6.5 STRL GRN (GLOVE) ×2 IMPLANT
GLOVE SURG SS PI 7.0 STRL IVOR (GLOVE) ×1 IMPLANT
GOWN STRL REUS W/TWL LRG LVL3 (GOWN DISPOSABLE) ×3 IMPLANT
GOWN STRL REUS W/TWL XL LVL3 (GOWN DISPOSABLE) ×3 IMPLANT
GRASPER SUT TROCAR 14GX15 (MISCELLANEOUS) ×1 IMPLANT
HEMOSTAT SNOW SURGICEL 2X4 (HEMOSTASIS) IMPLANT
KIT BASIN OR (CUSTOM PROCEDURE TRAY) ×2 IMPLANT
KIT TURNOVER KIT A (KITS) IMPLANT
NDL INSUFFLATION 14GA 120MM (NEEDLE) ×1 IMPLANT
NEEDLE INSUFFLATION 14GA 120MM (NEEDLE) ×2 IMPLANT
PENCIL SMOKE EVACUATOR (MISCELLANEOUS) IMPLANT
POUCH SPECIMEN RETRIEVAL 10MM (ENDOMECHANICALS) ×2 IMPLANT
SCISSORS LAP 5X35 DISP (ENDOMECHANICALS) ×2 IMPLANT
SET CHOLANGIOGRAPH MIX (MISCELLANEOUS) ×1 IMPLANT
SET IRRIG TUBING LAPAROSCOPIC (IRRIGATION / IRRIGATOR) ×2 IMPLANT
SET TUBE SMOKE EVAC HIGH FLOW (TUBING) ×2 IMPLANT
SLEEVE XCEL OPT CAN 5 100 (ENDOMECHANICALS) ×4 IMPLANT
SUT MNCRL AB 4-0 PS2 18 (SUTURE) ×2 IMPLANT
TOWEL OR 17X26 10 PK STRL BLUE (TOWEL DISPOSABLE) ×2 IMPLANT
TOWEL OR NON WOVEN STRL DISP B (DISPOSABLE) ×1 IMPLANT
TRAY LAPAROSCOPIC (CUSTOM PROCEDURE TRAY) ×2 IMPLANT
TROCAR BLADELESS OPT 5 100 (ENDOMECHANICALS) ×2 IMPLANT
TROCAR XCEL 12X100 BLDLESS (ENDOMECHANICALS) ×2 IMPLANT

## 2020-01-31 NOTE — Discharge Instructions (Signed)
LAPAROSCOPIC SURGERY: POST OP INSTRUCTIONS  ######################################################################  EAT Gradually transition to a high fiber diet with a fiber supplement over the next few weeks after discharge.  Start with a pureed / full liquid diet (see below)  WALK Walk an hour a day (cumulative).  Control your pain to do that.    CONTROL PAIN Control pain so that you can walk, sleep, tolerate sneezing/coughing, go up/down stairs.  HAVE A BOWEL MOVEMENT DAILY Keep your bowels regular to avoid problems.  OK to try a laxative to override constipation.  OK to use an antidairrheal to slow down diarrhea.  Call if not better after 2 tries  CALL IF YOU HAVE PROBLEMS/CONCERNS Call if you are still struggling despite following these instructions. Call if you have concerns not answered by these instructions  ######################################################################    1. DIET: Follow a light bland diet & liquids the first 24 hours after arrival home, such as soup, liquids, starches, etc.  Be sure to drink plenty of fluids.  Quickly advance to a usual solid diet within a few days.  Avoid fast food or heavy meals as your are more likely to get nauseated or have irregular bowels.  A low-sugar, high-fiber diet for the rest of your life is ideal.  2. Take your usually prescribed home medications unless otherwise directed.  3. PAIN CONTROL: a. Pain is best controlled by a usual combination of three different methods TOGETHER: i. Ice/Heat ii. Over the counter pain medication iii. Prescription pain medication b. Most patients will experience some swelling and bruising around the incisions.  Ice packs or heating pads (30-60 minutes up to 6 times a day) will help. Use ice for the first few days to help decrease swelling and bruising, then switch to heat to help relax tight/sore spots and speed recovery.  Some people prefer to use ice alone, heat alone, alternating between  ice & heat.  Experiment to what works for you.  Swelling and bruising can take several weeks to resolve.   c. It is helpful to take an over-the-counter pain medication regularly for the first few days: i. Naproxen (Aleve, etc)  Two 220mg  tabs twice a day OR Ibuprofen (Advil, etc) Three 200mg  tabs four times a day (every meal & bedtime) AND ii. Acetaminophen (Tylenol, etc) 500-650mg  four times a day (every meal & bedtime) d. A  prescription for pain medication (such as oxycodone, hydrocodone, tramadol, gabapentin, methocarbamol, etc) should be given to you upon discharge.  Take your pain medication as prescribed.  i. If you are having problems/concerns with the prescription medicine (does not control pain, nausea, vomiting, rash, itching, etc), please call us [phone] to see if we need to switch you to a different pain medicine that will work better for you and/or control your side effect better. ii. If you need a refill on your pain medication, please give [place] 48 hour notice.  contact your pharmacy.  They will contact our office to request authorization. Prescriptions will not be filled after 5 pm or on week-ends  4. Avoid getting constipated.   a. Between the surgery and the pain medications, it is common to experience some constipation.   b. Increasing fluid intake and taking a fiber supplement (such as Metamucil, Citrucel, FiberCon, MiraLax, etc) 1-2 times a day regularly will usually help prevent this problem from occurring.   c. A mild laxative (prune juice, Milk of Magnesia, MiraLax, etc) should be taken according to package directions if there are no bowel movements after  48 hours.   5. Watch out for diarrhea.   a. If you have many loose bowel movements, simplify your diet to bland foods & liquids for a few days.   b. Stop any stool softeners and decrease your fiber supplement.   c. Switching to mild anti-diarrheal medications (Kayopectate, Pepto Bismol) can help.   d. If this worsens or  does not improve, please call us.  6. Wash / shower every day.  You may shower over the skin glue which is waterproof.  Continue to shower over incision(s) after the dressing is off.  7. Glue will flake off after about 2 weeks.  You may leave the incision open to air.  You may replace a dressing/Band-Aid to cover the incision for comfort if you wish.   8. ACTIVITIES as tolerated:   a. You may resume regular (light) daily activities beginning the next day--such as daily self-care, walking, climbing stairs--gradually increasing activities as tolerated.  If you can walk 30 minutes without difficulty, it is safe to try more intense activity such as jogging, treadmill, bicycling, low-impact aerobics, swimming, etc. b. Save the most intensive and strenuous activity for last such as sit-ups, heavy lifting, contact sports, etc  Refrain from any heavy lifting or straining until you are off narcotics for pain control.   c. DO NOT PUSH THROUGH PAIN.  Let pain be your guide: If it hurts to do something, don't do it.  Pain is your body warning you to avoid that activity for another week until the pain goes down. d. You may drive when you are no longer taking prescription pain medication, you can comfortably wear a seatbelt, and you can safely maneuver your car and apply brakes. e. Dennis Bast may have sexual intercourse when it is comfortable.  9. FOLLOW UP in our office a. Please call [facility] at [phone] to set up an appointment to see your surgeon in the office for a follow-up appointment approximately 2-3 weeks after your surgery. b. Make sure that you call for this appointment the day you arrive home to insure a convenient appointment time.  10. IF YOU HAVE DISABILITY OR FAMILY LEAVE FORMS, BRING THEM TO THE OFFICE FOR PROCESSING.  DO NOT GIVE THEM TO YOUR DOCTOR.   WHEN TO CALL us [phone]: 1. Poor pain control 2. Reactions / problems with new medications (rash/itching, nausea, etc)  3. Fever over  101.5 F (38.5 C) 4. Inability to urinate 5. Nausea and/or vomiting 6. Worsening swelling or bruising 7. Continued bleeding from incision. 8. Increased pain, redness, or drainage from the incision   The clinic staff is available to answer your questions during regular business hours (8:30am-5pm).  Please don't hesitate to call and ask to speak to one of our nurses for clinical concerns.   If you have a medical emergency, go to the nearest emergency room or call 911.  A surgeon from [facility] is always on call at the [facility], [place], [place], [place], [place]  [address] ? MAIN: [phone] ? TOLL FREE: [phone] ?  FAX [phone] [email or web address]

## 2020-01-31 NOTE — Anesthesia Procedure Notes (Signed)
Procedure Name: Intubation Date/Time: 01/31/2020 9:36 AM Performed by: Talbot Grumbling, CRNA Pre-anesthesia Checklist: Patient identified, Emergency Drugs available, Suction available and Patient being monitored Patient Re-evaluated:Patient Re-evaluated prior to induction Oxygen Delivery Method: Circle system utilized Preoxygenation: Pre-oxygenation with 100% oxygen Induction Type: IV induction Ventilation: Mask ventilation without difficulty and Oral airway inserted - appropriate to patient size Laryngoscope Size: Sabra Heck and 2 Grade View: Grade II Tube type: Oral Number of attempts: 1 Airway Equipment and Method: Stylet and Oral airway Placement Confirmation: ETT inserted through vocal cords under direct vision,  positive ETCO2 and breath sounds checked- equal and bilateral Secured at: 21 cm Tube secured with: Tape Dental Injury: Teeth and Oropharynx as per pre-operative assessment

## 2020-01-31 NOTE — Op Note (Signed)
Operative Note  Jeff Jenkins 64 y.o. male [id number]  01/31/2020  Surgeon: Clovis Riley MD FACS  Procedure performed: Laparoscopic Cholecystectomy with intraoperative cholangiogram  Preop diagnosis: Biliary colic, concern of choledocholithiasis on ultrasound 1 month ago Post-op diagnosis/intraop findings: Biliary colic, negative cholangiogram  Specimens: gallbladder  Retained items: none  EBL: minimal  Complications: none  Description of procedure: After obtaining informed consent the patient was brought to the operating room. Antibiotics were administered. SCD's were applied. General endotracheal anesthesia was initiated and a formal time-out was performed. The abdomen was prepped and draped in the usual sterile fashion and the abdomen was entered using an infraumbilical veress needle after instilling the site with local. Insufflation to 29mmHg was obtained, 53mm trocar and camera inserted, and gross inspection revealed no evidence of injury from our entry.  The abdomen was surveyed for any gross abnormalities given the patient's history of weight loss last year. 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  No abnormalities of the anterior surface of the omentum, visible portions of visceral peritoneum, or stomach.  There is one tiny simple cyst on the anterior surface of the right lobe of the liver, and 1 minute cyst on the anterior surface of the left lobe of the liver but no large cysts or visible lesions of concern.  Two 15mm trocars were introduced in the right midclavicular and right anterior axillary lines under direct visualization and following infiltration with local. An 41mm trocar was placed in the epigastrium. The gallbladder was retracted cephalad and the infundibulum was retracted laterally. A combination of hook electrocautery and blunt dissection was utilized to clear  the peritoneum from the neck and cystic duct, circumferentially isolating the cystic artery and cystic duct and lifting the gallbladder from the cystic plate. The critical view of safety was achieved with the cystic artery, cystic duct, and liver bed visualized between them with no other structures. The artery was clipped with a single clip proximally and distally and divided.  A clip was placed on the distal cystic duct and ductotomy made.  The cholangiogram catheter was then inserted and secured with a clip.  Cholangiogram was performed demonstrating typical biliary anatomy and no obstructing lesions in the common bile duct, with free flow of contrast into the duodenum.  The cholangiogram catheter and securing clip were removed.  The cystic duct was divided after ligating the proximal end with 3 clips. The gallbladder was dissected from the liver plate using electrocautery.  There was a small posterior branch cystic artery which was controlled with a clip prior to dividing distally with cautery.  Once freed the gallbladder was placed in an endocatch bag and removed through the epigastric trocar site. Some bile had been spilled from the gallbladder during its dissection from the liver bed. This was aspirated and the right upper quadrant was irrigated copiously until the effluent was clear. Hemostasis was once again confirmed, and reinspection of the abdomen revealed no injuries. The clips were well opposed without any bile leak from the duct or the liver bed. The 64mm trocar site in the epigastrium was closed with a 0 vicryl in the fascia under direct visualization using a PMI device. The abdomen was desufflated and all trocars removed. The skin incisions were closed with subcuticular monocryl and Dermabond. The patient was awakened, extubated and transported to the recovery room in stable condition.    All counts  were correct at the completion of the case.

## 2020-01-31 NOTE — Anesthesia Postprocedure Evaluation (Signed)
Anesthesia Post Note  Patient: Jeff Jenkins  Procedure(s) Performed: LAPAROSCOPIC CHOLECYSTECTOMY WITH INTRAOPERATIVE CHOLANGIOGRAM (N/A Abdomen)     Patient location during evaluation: Phase II Anesthesia Type: General Level of consciousness: awake and alert, oriented and patient cooperative Pain management: pain level controlled Vital Signs Assessment: post-procedure vital signs reviewed and stable Respiratory status: spontaneous breathing, nonlabored ventilation and respiratory function stable Cardiovascular status: blood pressure returned to baseline and stable Postop Assessment: no headache, no backache and no apparent nausea or vomiting Anesthetic complications: no   No complications documented.  Last Vitals:  Vitals:   01/31/20 1130 01/31/20 1145  BP: (!) 157/85 (!) 160/95  Pulse: 87 94  Resp: 17 12  Temp: 36.6 C 36.4 C  SpO2: 94% 92%    Last Pain:  Vitals:   01/31/20 1145  TempSrc: Oral  PainSc: 0-No pain                 Pilot Prindle,E. Jaire Pinkham

## 2020-01-31 NOTE — Interval H&P Note (Signed)
History and Physical Interval Note:  01/31/2020 8:48 AM  Jeff Jenkins  has presented today for surgery, with the diagnosis of BILIARY COLIC, CHOLEDOCHOLITHIASIS.  The various methods of treatment have been discussed with the patient and family. After consideration of risks, benefits and other options for treatment, the patient has consented to  Procedure(s): LAPAROSCOPIC CHOLECYSTECTOMY WITH INTRAOPERATIVE CHOLANGIOGRAM (N/A) as a surgical intervention.  The patient's history has been reviewed, patient examined, no change in status, stable for surgery.  I have reviewed the patient's chart and labs.  Questions were answered to the patient's satisfaction.     [name]

## 2020-01-31 NOTE — Transfer of Care (Signed)
Immediate Anesthesia Transfer of Care Note  Patient: Jeff Jenkins  Procedure(s) Performed: LAPAROSCOPIC CHOLECYSTECTOMY WITH INTRAOPERATIVE CHOLANGIOGRAM (N/A Abdomen)  Patient Location: PACU  Anesthesia Type:General  Level of Consciousness: awake, patient cooperative and responds to stimulation  Airway & Oxygen Therapy: Patient Spontanous Breathing and Patient connected to face mask oxygen  Post-op Assessment: Report given to RN, Post -op Vital signs reviewed and stable and Patient moving all extremities  Post vital signs: Reviewed and stable  Last Vitals:  Vitals Value Taken Time  BP 146/77 01/31/20 1045  Temp 36.4 C 01/31/20 1043  Pulse 82 01/31/20 1048  Resp 15 01/31/20 1048  SpO2 100 % 01/31/20 1048  Vitals shown include unvalidated device data.  Last Pain:  Vitals:   01/31/20 0758  TempSrc: Oral  PainSc:       Patients Stated Pain Goal: 4 (03/88/82 8003)  Complications: No complications documented.

## 2020-01-31 NOTE — Anesthesia Preprocedure Evaluation (Addendum)
Anesthesia Evaluation  Patient identified by MRN, date of birth, ID band Patient awake    Reviewed: Allergy & Precautions, NPO status , Patient's Chart, lab work & pertinent test results  History of Anesthesia Complications Negative for: history of anesthetic complications  Airway Mallampati: II  TM Distance: >3 FB Neck ROM: Full    Dental  (+) Caps, Dental Advisory Given   Pulmonary COPD,  COPD inhaler,  01/27/2020 SARS coronavirus NEG   breath sounds clear to auscultation       Cardiovascular hypertension, Pt. on medications (-) angina+ CAD (non-obstructive)   Rhythm:Regular Rate:Normal  '20 cardiac CT:  Diffuse nonobstructive 3 vessel calcified plaque; 50-69% stenosis proximal LAD; 25-49% stenosis proximal D1 and in RCA at takeoff of PDA. Marland Kitchen    Neuro/Psych negative neurological ROS     GI/Hepatic Neg liver ROS, PUD, GERD  Medicated and Controlled,  Endo/Other  diabetes (glu 96), Oral Hypoglycemic Agents  Renal/GU negative Renal ROS   H/o prostate cancer    Musculoskeletal   Abdominal   Peds  Hematology negative hematology ROS (+)   Anesthesia Other Findings   Reproductive/Obstetrics                            Anesthesia Physical Anesthesia Plan  ASA: II  Anesthesia Plan: General   Post-op Pain Management:    Induction: Intravenous  PONV Risk Score and Plan: 2 and Ondansetron and Dexamethasone  Airway Management Planned: Oral ETT  Additional Equipment: None  Intra-op Plan:   Post-operative Plan: Extubation in OR  Informed Consent: I have reviewed the patients History and Physical, chart, labs and discussed the procedure including the risks, benefits and alternatives for the proposed anesthesia with the patient or authorized representative who has indicated his/her understanding and acceptance.     Dental advisory given  Plan Discussed with: CRNA and  Surgeon  Anesthesia Plan Comments:        Anesthesia Quick Evaluation

## 2020-02-01 ENCOUNTER — Encounter (HOSPITAL_COMMUNITY): Payer: Self-pay | Admitting: Surgery

## 2020-02-01 LAB — SURGICAL PATHOLOGY

## 2020-02-16 ENCOUNTER — Ambulatory Visit (INDEPENDENT_AMBULATORY_CARE_PROVIDER_SITE_OTHER): Payer: BC Managed Care – PPO | Admitting: Internal Medicine

## 2020-02-16 ENCOUNTER — Encounter: Payer: Self-pay | Admitting: Internal Medicine

## 2020-02-16 ENCOUNTER — Other Ambulatory Visit: Payer: Self-pay

## 2020-02-16 VITALS — BP 140/80 | HR 95 | Temp 98.0°F | Ht 72.0 in | Wt 169.4 lb

## 2020-02-16 DIAGNOSIS — I2510 Atherosclerotic heart disease of native coronary artery without angina pectoris: Secondary | ICD-10-CM

## 2020-02-16 DIAGNOSIS — C61 Malignant neoplasm of prostate: Secondary | ICD-10-CM

## 2020-02-16 DIAGNOSIS — I2583 Coronary atherosclerosis due to lipid rich plaque: Secondary | ICD-10-CM

## 2020-02-16 DIAGNOSIS — R030 Elevated blood-pressure reading, without diagnosis of hypertension: Secondary | ICD-10-CM | POA: Diagnosis not present

## 2020-02-16 DIAGNOSIS — E119 Type 2 diabetes mellitus without complications: Secondary | ICD-10-CM | POA: Diagnosis not present

## 2020-02-16 DIAGNOSIS — E7800 Pure hypercholesterolemia, unspecified: Secondary | ICD-10-CM | POA: Diagnosis not present

## 2020-02-16 DIAGNOSIS — E349 Endocrine disorder, unspecified: Secondary | ICD-10-CM

## 2020-02-16 DIAGNOSIS — K219 Gastro-esophageal reflux disease without esophagitis: Secondary | ICD-10-CM

## 2020-02-16 NOTE — Progress Notes (Signed)
New Patient Office Visit     This visit occurred during the SARS-CoV-2 public health emergency.  Safety protocols were in place, including screening questions prior to the visit, additional usage of staff PPE, and extensive cleaning of exam room while observing appropriate contact time as indicated for disinfecting solutions.    CC/Reason for Visit: Establish care, discuss chronic medical conditions Previous PCP: Deland Pretty, MD Last Visit: 2020  HPI: Jeff Jenkins is a 64 y.o. male who is coming in today for the above mentioned reasons. Past Medical History is significant for: Recently diagnosed type 2 diabetes, hyperlipidemia, hypertension, GERD, coronary artery disease.  He was recently seen for right upper quadrant pain and ended up having a laparoscopic cholecystectomy due to cholelithiasis.  He has recovered well from this.  He has right knee pain and has been scheduled to have right knee surgery however this has been delayed due to his recent gallbladder surgery.  He also has a history of testosterone deficiency and is on testosterone supplementation and Cialis prescribed by his urologist.  He had prostate cancer and is status post total prostatectomy with a recent undetectable PSA in June.   Past Medical/Surgical History: Past Medical History:  Diagnosis Date  . Arthritis   . Broken ankle 2008  . Diverticulosis   . ED (erectile dysfunction)   . Facial fracture ([place]) 1979  . GERD (gastroesophageal reflux disease)   . Hearing loss in right ear   . Hemorrhoids   . Hiatal hernia   . Peptic stricture of esophagus   . Peptic ulcer disease 1989  . Prostate cancer ([place]) 02/2009  . Seasonal allergies     Past Surgical History:  Procedure Laterality Date  . ANKLE SURGERY Left 2008  . CHOLECYSTECTOMY N/A 01/31/2020   Procedure: LAPAROSCOPIC CHOLECYSTECTOMY WITH INTRAOPERATIVE CHOLANGIOGRAM;  Surgeon: Clovis Riley, MD;  Location: [facility];  Service: General;   Laterality: N/A;  . FACIAL FRACTURE SURGERY    . INGUINAL HERNIA REPAIR Left   . KNEE ARTHROSCOPY Left 2013  . PROSTATECTOMY  2011  . TONSILLECTOMY    . VASECTOMY      Social History:  reports that he has never smoked. He has never used smokeless tobacco. He reports current alcohol use of about 12.0 standard drinks of alcohol per week. He reports that he does not use drugs.  Allergies: No Known Allergies  Family History:  Family History  Problem Relation Age of Onset  . Breast cancer Mother   . Diabetes Mother   . Heart attack Mother   . Alcohol abuse Father   . Stroke Brother   . Colon cancer Neg Hx   . Esophageal cancer Neg Hx   . Rectal cancer Neg Hx   . Stomach cancer Neg Hx      Current Outpatient Medications:  .  acetaminophen (TYLENOL) 325 MG tablet, Take 650 mg by mouth every 6 (six) hours as needed for moderate pain., Disp: , Rfl:  .  aspirin EC 81 MG tablet, Take 81 mg by mouth daily., Disp: , Rfl:  .  atorvastatin (LIPITOR) 40 MG tablet, Take 40 mg by mouth daily., Disp: , Rfl:  .  AXIRON 30 MG/ACT SOLN, Apply 30 mg topically daily. Under each arm, Disp: , Rfl:  .  azelastine (ASTELIN) 0.1 % nasal spray, Place 1 spray into both nostrils 2 (two) times daily as needed for allergies. Use in each nostril as directed , Disp: , Rfl:  .  diclofenac  Sodium (VOLTAREN) 1 % GEL, Apply 2 g topically in the morning, at noon, and at bedtime., Disp: , Rfl:  .  esomeprazole (NEXIUM) 40 MG capsule, TAKE 1 CAPSULE BY MOUTH EVERY DAY BEFORE BREAKFAST (Patient taking differently: Take 40 mg by mouth daily. TAKE 1 CAPSULE BY MOUTH EVERY DAY BEFORE BREAKFAST), Disp: 90 capsule, Rfl: 3 .  ezetimibe (ZETIA) 10 MG tablet, Take 10 mg by mouth daily., Disp: , Rfl:  .  ipratropium (ATROVENT) 0.03 % nasal spray, Place 2 sprays into both nostrils 2 (two) times daily as needed for rhinitis. , Disp: , Rfl:  .  Lancets (ONETOUCH ULTRASOFT) lancets, 1 each by Other route daily. Dx E11.9, Disp: ,  Rfl:  .  lisinopril (ZESTRIL) 5 MG tablet, Take 5 mg by mouth daily., Disp: , Rfl:  .  loratadine (CLARITIN) 10 MG tablet, Take 10 mg by mouth daily. , Disp: , Rfl:  .  SYNJARDY XR 25-1000 MG TB24, Take 1 tablet by mouth daily. , Disp: , Rfl:  .  tadalafil (CIALIS) 5 MG tablet, Take 5 mg by mouth daily as needed for erectile dysfunction., Disp: , Rfl:   Review of Systems:  Constitutional: Denies fever, chills, diaphoresis, appetite change and fatigue.  HEENT: Denies photophobia, eye pain, redness, hearing loss, ear pain, congestion, sore throat, rhinorrhea, sneezing, mouth sores, trouble swallowing, neck pain, neck stiffness and tinnitus.   Respiratory: Denies SOB, DOE, cough, chest tightness,  and wheezing.   Cardiovascular: Denies chest pain, palpitations and leg swelling.  Gastrointestinal: Denies nausea, vomiting, abdominal pain, diarrhea, constipation, blood in stool and abdominal distention.  Genitourinary: Denies dysuria, urgency, frequency, hematuria, flank pain and difficulty urinating.  Endocrine: Denies: hot or cold intolerance, sweats, changes in hair or nails, polyuria, polydipsia. Musculoskeletal: Denies myalgias, back pain, joint swelling, arthralgias and gait problem.  Skin: Denies pallor, rash and wound.  Neurological: Denies dizziness, seizures, syncope, weakness, light-headedness, numbness and headaches.  Hematological: Denies adenopathy. Easy bruising, personal or family bleeding history  Psychiatric/Behavioral: Denies suicidal ideation, mood changes, confusion, nervousness, sleep disturbance and agitation    Physical Exam: Vitals:   02/16/20 1301  BP: 140/80  Pulse: 95  Temp: 98 F (36.7 C)  TempSrc: Oral  SpO2: 96%  Weight: 169 lb 6.4 oz (76.8 kg)  Height: 6' (1.829 m)   Body mass index is 22.97 kg/m.  Constitutional: NAD, calm, comfortable Eyes: PERRL, lids and conjunctivae normal ENMT: Mucous membranes are moist Respiratory: clear to auscultation  bilaterally, no wheezing, no crackles. Normal respiratory effort. No accessory muscle use.  Cardiovascular: Regular rate and rhythm, no murmurs / rubs / gallops. No extremity edema.  Neurologic: Grossly intact and nonfocal.  Psychiatric: Normal judgment and insight. Alert and oriented x 3. Normal mood.    Impression and Plan:  Coronary artery disease due to lipid rich plaque -Followed by cardiology, currently under medical management.  Type 2 diabetes mellitus without obesity ([place]) -Well-controlled with the most recent A1c of 6.2.  He is on [place].  Elevated blood pressure reading in office with white coat syndrome, without diagnosis of hypertension -Blood pressure is a little elevated in office today to 140/80, have advised ambulatory blood pressure monitoring and he will return in 3 months for follow-up.  Pure hypercholesterolemia -Last LDL was 87 in April, he is on Lipitor and ezetimibe. -Recheck lipids when he returns for physical.  Gastroesophageal reflux disease, unspecified whether esophagitis present -Well-controlled on daily PPI therapy.  Testosterone deficiency -On testosterone supplementation followed by urology  Prostate cancer ([place]) -Status post prostatectomy with recently undetectable PSA, followed by urology.     Lelon Frohlich, MD  Primary Care at [facility]

## 2020-02-22 ENCOUNTER — Telehealth (INDEPENDENT_AMBULATORY_CARE_PROVIDER_SITE_OTHER): Payer: BC Managed Care – PPO | Admitting: Cardiology

## 2020-02-22 ENCOUNTER — Encounter: Payer: Self-pay | Admitting: Cardiology

## 2020-02-22 VITALS — BP 108/76 | HR 73 | Ht 72.0 in | Wt 166.4 lb

## 2020-02-22 DIAGNOSIS — N529 Male erectile dysfunction, unspecified: Secondary | ICD-10-CM

## 2020-02-22 DIAGNOSIS — I2510 Atherosclerotic heart disease of native coronary artery without angina pectoris: Secondary | ICD-10-CM | POA: Diagnosis not present

## 2020-02-22 DIAGNOSIS — E119 Type 2 diabetes mellitus without complications: Secondary | ICD-10-CM

## 2020-02-22 DIAGNOSIS — Z7189 Other specified counseling: Secondary | ICD-10-CM

## 2020-02-22 DIAGNOSIS — I2583 Coronary atherosclerosis due to lipid rich plaque: Secondary | ICD-10-CM

## 2020-02-22 DIAGNOSIS — I493 Ventricular premature depolarization: Secondary | ICD-10-CM | POA: Diagnosis not present

## 2020-02-22 DIAGNOSIS — E7800 Pure hypercholesterolemia, unspecified: Secondary | ICD-10-CM

## 2020-02-22 NOTE — Progress Notes (Signed)
Virtual Visit via Telephone Note   This visit type was conducted due to national recommendations for restrictions regarding the COVID-19 Pandemic (e.g. social distancing) in an effort to limit this patient's exposure and mitigate transmission in our community.  Due to his co-morbid illnesses, this patient is at least at moderate risk for complications without adequate follow up.  This format is felt to be most appropriate for this patient at this time.  The patient did not have access to video technology/had technical difficulties with video requiring transitioning to audio format only (telephone).  All issues noted in this document were discussed and addressed.  No physical exam could be performed with this format.  Please refer to the patient's chart for his  consent to telehealth for [facility].   The patient was identified using 2 identifiers.  Patient Location: Home Provider Location: Home Office  Date:  02/22/2020   ID:  Kalev, Temme [birth year], MRN [id number]  PCP:  Isaac Bliss, Rayford Halsted, MD  Cardiologist:  Buford Dresser, MD PhD  CC: follow up  History of Present Illness:    Harvel Meskill is a 64 y.o. male with a hx of allergies, type II diabetes lifestyle controlled who is seen for follow up today. He was initially seen 04/04/19 as a new consult at the request of Deland Pretty, MD for the evaluation and management of elevated cardiovascular risk.  CV history/risk factors: Strong family history: lost mother to his heart attack suddenly at age 26 (had evidence of old MI that was unknown). Lost his brother to a massive stroke earlier this year at age 4.   Calcium noted on CXR 02/2019, started on rosuvastatin but recently stopped due to myalgia. Risk factors of type II diabetes. CT cardiac done 05/18/19, CAD noted with high calcium score, reviewed below.  Today: Had gallbladder surgery about 3 weeks ago, recovering well. Cancelled knee surgery (which  had been scheduled). No longer having the severe pain he was having. Eating, drinking well. 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 Back on aspirin, held prior to surgery.  Now seeing Dr. Isaac Bliss as PCP. Had BP of 140/80 in the office, has been keeping track at home. Much better at home, 108/76 this AM. Yesterday 103/74 in the AM, 101/61 in the PM. 8/6 128/78 in the AM. Asymptomatic with lows.  Did extensive med rec today. Discussed cialis again today. After last conversation together, he decided to stop taking, but this has impacted his sexual function significantly. We discussed nitrates at length, focused on risk with nitroglycerin. He will talk to his urologist at his upcoming visit to see if there is a shorter acting option that might work for him.  Reviewed his anatomy from his CT scan with his wife on the phone.  Denies chest pain, shortness of breath at rest or with normal exertion. No PND, orthopnea, LE edema or unexpected weight gain. No syncope or palpitations.  Past Medical History:  Diagnosis Date  . Arthritis   . Broken ankle 2008  . Diverticulosis   . ED (erectile dysfunction)   . Facial fracture ([place]) 1979  . GERD (gastroesophageal reflux disease)   . Hearing loss in right ear   . Hemorrhoids   . Hiatal hernia   . Peptic stricture of esophagus   . Peptic ulcer disease 1989  . Prostate cancer ([place]) 02/2009  . Seasonal allergies     Past Surgical History:  Procedure Laterality Date  .  ANKLE SURGERY Left 2008  . CHOLECYSTECTOMY N/A 01/31/2020   Procedure: LAPAROSCOPIC CHOLECYSTECTOMY WITH INTRAOPERATIVE CHOLANGIOGRAM;  Surgeon: Clovis Riley, MD;  Location: [facility];  Service: General;  Laterality: N/A;  . FACIAL FRACTURE SURGERY    . INGUINAL HERNIA REPAIR Left   . KNEE ARTHROSCOPY Left 2013  . PROSTATECTOMY  2011  . TONSILLECTOMY    . VASECTOMY      Current Medications: Current Outpatient Medications on File Prior to  Visit  Medication Sig  . acetaminophen (TYLENOL) 325 MG tablet Take 650 mg by mouth every 6 (six) hours as needed for moderate pain.  Marland Kitchen atorvastatin (LIPITOR) 40 MG tablet Take 40 mg by mouth daily.  Hinda Kehr 30 MG/ACT SOLN Apply 30 mg topically daily. Under each arm  . azelastine (ASTELIN) 0.1 % nasal spray Place 1 spray into both nostrils 2 (two) times daily as needed for allergies. Use in each nostril as directed   . diclofenac Sodium (VOLTAREN) 1 % GEL Apply 2 g topically in the morning, at noon, and at bedtime.  Marland Kitchen esomeprazole (NEXIUM) 40 MG capsule TAKE 1 CAPSULE BY MOUTH EVERY DAY BEFORE BREAKFAST (Patient taking differently: Take 40 mg by mouth daily. TAKE 1 CAPSULE BY MOUTH EVERY DAY BEFORE BREAKFAST)  . ezetimibe (ZETIA) 10 MG tablet Take 10 mg by mouth daily.  Marland Kitchen ipratropium (ATROVENT) 0.03 % nasal spray Place 2 sprays into both nostrils 2 (two) times daily as needed for rhinitis.   Marland Kitchen lisinopril (ZESTRIL) 5 MG tablet Take 5 mg by mouth daily.  Marland Kitchen loratadine (CLARITIN) 10 MG tablet Take 10 mg by mouth daily.   . Lancets (ONETOUCH ULTRASOFT) lancets 1 each by Other route daily. Dx E11.9  . SYNJARDY XR 25-1000 MG TB24 Take 1 tablet by mouth daily.   . tadalafil (CIALIS) 5 MG tablet Take 5 mg by mouth daily as needed for erectile dysfunction. (Patient not taking: Reported on 02/22/2020)   No current facility-administered medications on file prior to visit.     Allergies:   Patient has no known allergies.   Social History   Tobacco Use  . Smoking status: Never Smoker  . Smokeless tobacco: Never Used  Vaping Use  . Vaping Use: Never used  Substance Use Topics  . Alcohol use: Yes    Alcohol/week: 12.0 standard drinks    Types: 12 Standard drinks or equivalent per week    Comment: social   . Drug use: No    Family History: The patient's family history includes Alcohol abuse in his father; Breast cancer in his mother; Diabetes in his mother; Heart attack in his mother; Stroke in  his brother. There is no history of Colon cancer, Esophageal cancer, Rectal cancer, or Stomach cancer.  ROS:   Please see the history of present illness.  Additional pertinent ROS otherwise negative  EKGs/Labs/Other Studies Reviewed:    The following studies were reviewed today: Cardiac CT 05-18-19 Aorta: Mildly dilated aortic root (42 mm). Minimal calcifications. No dissection.  Aortic Valve:  Trileaflet.  No calcifications. Coronary Arteries:  Normal coronary origin.  Right dominance.  RCA is a large dominant artery that gives rise to PDA and PLVB. There is diffuse minimal calcified plaque (0-24%); mild calcified plaque (25-49%) at takeoff of PDA. Minimal plaque (0-24%) in posterolateral.  Left main is a large artery that gives rise to LAD and LCX arteries. There is no plaque.  LAD is a large vessel that has 2 diagonals. There is calcified plaque in the proximal (  50-69%) and mid vessel (0-24%); there is mild calcified plaque (25-49%) in proximal D1.  LCX is a non-dominant artery that gives rise to small OM1 and OM2. There is minimal calcified plaque (0-24%) noted.  Other findings: Normal pulmonary vein drainage into the left atrium. Normal let atrial appendage without a thrombus. 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  IMPRESSION: 1. Coronary calcium score of 2319. This was 21 percentile for age and sex matched control.  2. Normal coronary origin with right dominance.  3. Diffuse nonobstructive 3 vessel calcified plaque; 50-69% stenosis in proximal LAD; 25-49% stenosis proximal D1 and in RCA at takeoff of PDA. CADRADS-3.  Monitor report 04/2019 14 days of data recorded on Zio monitor. Patient had a min HR of 48 bpm, max HR of 184 bpm, and avg HR of 86 bpm. Predominant underlying rhythm was Sinus Rhythm. No VT, SVT, atrial fibrillation, high degree block, or pauses noted. Isolated atrial ectopy was rare (<1%). Isolated ventricular ectopy was occasional (2.6%). There  was a single 4 beat run of SVT and a single 4 beat run of PVCs.  Longest ventricular bigeminy was 23 seconds, and longest ventricular trigeminy episode was 38 seconds. There were 0 triggered events.    EKG:  EKG is personally reviewed.  The ekg ordered 03/25/19 demonstrates SR with PVCs--two morphologies, and 2 PVCs are consecutive  Recent Labs: 01/27/2020: ALT 27; BUN 13; Creatinine, Ser 0.74; Hemoglobin 15.0; Platelets 169; Potassium 4.3; Sodium 136  Recent Lipid Panel    Component Value Date/Time   CHOL 170 08/23/2019 1221   TRIG 70 08/23/2019 1221   HDL 66 08/23/2019 1221   CHOLHDL 2.6 08/23/2019 1221   LDLCALC 91 08/23/2019 1221    Physical Exam:    VS:  BP 108/76   Pulse 73   Ht 6' (1.829 m)   Wt 166 lb 6.4 oz (75.5 kg)   BMI 22.57 kg/m     Wt Readings from Last 3 Encounters:  02/22/20 166 lb 6.4 oz (75.5 kg)  02/16/20 169 lb 6.4 oz (76.8 kg)  01/31/20 171 lb 3.2 oz (77.7 kg)    Speaking comfortably on the phone, no audible wheezing In no acute distress Alert and oriented Normal affect Normal speech  ASSESSMENT:    1. Coronary artery disease due to lipid rich plaque   2. Coronary artery calcification seen on CT scan   3. Pure hypercholesterolemia   4. Erectile dysfunction, unspecified erectile dysfunction type   5. PVC's (premature ventricular contractions)   6. Type 2 diabetes mellitus without obesity (HCC)   7. Cardiac risk counseling   8. Counseling on health promotion and disease prevention    PLAN:    Evidence of significant CAD on cardiac CT: while no plaque with >70% stenosis suggest that chest pain is not ischemic in origin, his very high calcium score and mixed/lipid rich plaque in proximal LAD is concerning. Has diffuse calcified plaue in 3 vessels as well as focal mixed/lipid rich plaque -continue aspirin 81 mg  -tolerating statin, as below  Hypercholesterolemia: goal LDL <70 given CAD -he is tolerating atorvastatin 40 mg daily, continue -From  02/08/19 (prior to statin): Tchol 181, TG 102, HDL 37, LDL 124 -post statin lipids 08/23/19: Tchol 170, TG 70, HDL 66, LDL 91 -continuing to work on lifestyle  Erectile dysfunction: we discussed PDE5 inhibitors again. He stopped using these daily, but it has significantly impacted his sexual function. Discussed pros/cons. Compromise might be a short acting PDE5i and making sure EMS/ER  knows if he has taken PDE5i recently should he develop chest pain. He will discuss this with his urologist.  PVCs: burden <3%.  -currently asymptomatic, but if worsens consider echo in the future. Have discussed beta blockers, declines currently. Monitor for symptoms.  Type II diabetes without obesity: BMI 22 -on synjardy, which contains empagliflozin and metformin. Therefore is receiving SGLT2i benefit with CAD  Cardiac risk counseling and prevention recommendations: -recommend heart healthy/Mediterranean diet, with whole grains, fruits, vegetable, fish, lean meats, nuts, and olive oil. Limit salt. -recommend moderate walking, 3-5 times/week for 30-50 minutes each session. Aim for at least 150 minutes.week. Goal should be pace of 3 miles/hours, or walking 1.5 miles in 30 minutes -recommend avoidance of tobacco products. Avoid excess alcohol.  Plan for follow up: 6 mos or sooner as needed  Today, I have spent 24 minutes with the patient with telehealth technology discussing the above problems.  Additional time spent in chart review, documentation, and communication.  Medication Adjustments/Labs and Tests Ordered: Current medicines are reviewed at length with the patient today.  Concerns regarding medicines are outlined above.  No orders of the defined types were placed in this encounter.  No orders of the defined types were placed in this encounter.   Patient Instructions  Medication Instructions:  No Changes *If you need a refill on your cardiac medications before your next appointment, please call your  pharmacy*   Lab Work: None Ordered If you have labs (blood work) drawn today and your tests are completely normal, you will receive your results only by: Marland Kitchen MyChart Message (if you have MyChart) OR . A paper copy in the mail If you have any lab test that is abnormal or we need to change your treatment, we will call you to review the results.   Testing/Procedures: None Ordered   Follow-Up: At [facility], you and your health needs are our priority.  As part of our continuing mission to provide you with exceptional heart care, we have created designated Provider Care Teams.  These Care Teams include your primary Cardiologist (physician) and Advanced Practice Providers (APPs -  Physician Assistants and Nurse Practitioners) who all work together to provide you with the care you need, when you need it.  We recommend signing up for the patient portal called "MyChart".  Sign up information is provided on this After Visit Summary.  MyChart is used to connect with patients for Virtual Visits (Telemedicine).  Patients are able to view lab/test results, encounter notes, upcoming appointments, etc.  Non-urgent messages can be sent to your provider as well.   To learn more about what you can do with MyChart, go to [email or web address].    Your next appointment:   6 month(s)  The format for your next appointment:   In Person  Provider:   Buford Dresser, MD   Other Instructions    Signed, Buford Dresser, MD PhD 02/22/2020  [place]

## 2020-02-22 NOTE — Patient Instructions (Signed)
Medication Instructions:  No Changes *If you need a refill on your cardiac medications before your next appointment, please call your pharmacy*   Lab Work: None Ordered If you have labs (blood work) drawn today and your tests are completely normal, you will receive your results only by: Marland Kitchen MyChart Message (if you have MyChart) OR . A paper copy in the mail If you have any lab test that is abnormal or we need to change your treatment, we will call you to review the results.   Testing/Procedures: None Ordered   Follow-Up: At [facility], you and your health needs are our priority.  As part of our continuing mission to provide you with exceptional heart care, we have created designated Provider Care Teams.  These Care Teams include your primary Cardiologist (physician) and Advanced Practice Providers (APPs -  Physician Assistants and Nurse Practitioners) who all work together to provide you with the care you need, when you need it.  We recommend signing up for the patient portal called "MyChart".  Sign up information is provided on this After Visit Summary.  MyChart is used to connect with patients for Virtual Visits (Telemedicine).  Patients are able to view lab/test results, encounter notes, upcoming appointments, etc.  Non-urgent messages can be sent to your provider as well.   To learn more about what you can do with MyChart, go to [email or web address].    Your next appointment:   6 month(s)  The format for your next appointment:   In Person  Provider:   Buford Dresser, MD   Other Instructions

## 2020-02-24 ENCOUNTER — Encounter: Payer: Self-pay | Admitting: Cardiology

## 2020-03-20 ENCOUNTER — Other Ambulatory Visit: Payer: Self-pay | Admitting: Gastroenterology

## 2020-04-02 ENCOUNTER — Other Ambulatory Visit: Payer: Self-pay | Admitting: Internal Medicine

## 2020-05-05 ENCOUNTER — Telehealth: Payer: Self-pay | Admitting: Cardiology

## 2020-05-05 ENCOUNTER — Other Ambulatory Visit: Payer: Self-pay | Admitting: *Deleted

## 2020-05-05 MED ORDER — ONETOUCH ULTRASOFT LANCETS MISC: 1.0000 | Freq: Every day | 3 refills | Status: AC

## 2020-05-05 NOTE — Telephone Encounter (Signed)
   [facility] Pre-operative Risk Assessment    Request for surgical clearance:  1. What type of surgery is being performed? Right total knee arthroplasty  (at [place])  2. When is this surgery scheduled? 07/03/2020   3. What type of clearance is required (medical clearance vs. Pharmacy clearance to hold med vs. Both)? Both   4. Are there any medications that need to be held prior to surgery and how long? ASA - 7 days  5. Practice name and name of physician performing surgery? Dr. Paralee Cancel with [facility]   6. What is the office phone number? [phone]   7.   What is the office fax number? [phone]  8.   Anesthesia type (None, local, MAC, general) ? Spinal    Sheral Apley M 05/05/2020, 4:47 PM  _________________________________________________________________   (provider comments below)

## 2020-05-08 NOTE — Telephone Encounter (Signed)
   Primary Cardiologist: Buford Dresser, MD  Chart reviewed as part of pre-operative protocol coverage. Patient was last seen by Dr. Harrell Gave for a virtual visit in 02/2020 at which time he was doing well from a cardiac standpoint. Patient was contacted today for further pre-op evaluation. He reports doing well since last visit. No chest pain, shortness of breath, orthopnea, palpitations, or syncope. Able to complete >4.0 METS. Given past medical history and time since last visit, based on ACC/AHA guidelines, Jeff Jenkins would be at acceptable risk for the planned procedure without further cardiovascular testing.   Okay to hold Aspirin for 5-7 days prior to procedure if needed. This should be restarted as soon as able postoperatively.  The patient was advised that if he develops new symptoms prior to surgery to contact our office to arrange for a follow-up visit, and he verbalized understanding.  I will route this recommendation to the requesting party via Epic fax function and remove from pre-op pool.  Please call with questions.  Darreld Mclean, PA-C 05/08/2020, 11:19 AM

## 2020-05-12 ENCOUNTER — Other Ambulatory Visit: Payer: Self-pay | Admitting: Internal Medicine

## 2020-05-16 ENCOUNTER — Other Ambulatory Visit: Payer: Self-pay | Admitting: Urology

## 2020-05-18 ENCOUNTER — Encounter: Payer: Self-pay | Admitting: Gastroenterology

## 2020-05-18 ENCOUNTER — Ambulatory Visit (INDEPENDENT_AMBULATORY_CARE_PROVIDER_SITE_OTHER): Payer: BC Managed Care – PPO | Admitting: Gastroenterology

## 2020-05-18 VITALS — BP 128/70 | HR 95 | Ht 72.0 in | Wt 169.4 lb

## 2020-05-18 DIAGNOSIS — R634 Abnormal weight loss: Secondary | ICD-10-CM

## 2020-05-18 DIAGNOSIS — K219 Gastro-esophageal reflux disease without esophagitis: Secondary | ICD-10-CM | POA: Diagnosis not present

## 2020-05-18 MED ORDER — ESOMEPRAZOLE MAGNESIUM 40 MG PO CPDR: DELAYED_RELEASE_CAPSULE | ORAL | 3 refills | Status: DC

## 2020-05-18 NOTE — Patient Instructions (Signed)
We have sent the following medications to your pharmacy for you to pick up at your convenience: Nexium 40 mg daily.   You have been scheduled for an endoscopy. Please follow written instructions given to you at your visit today. If you use inhalers (even only as needed), please bring them with you on the day of your procedure.  Thank you for choosing me and [facility].  Pricilla Riffle. Dagoberto Ligas., MD., Marval Regal

## 2020-05-18 NOTE — Progress Notes (Signed)
    History of Present Illness: This is a 64 year old male with GERD complaining of weight loss.  He relates a good appetite with no change in exercise or meal patterns.  He was diagnosed with diabetes about 1 year ago and is under treatment.  His reflux symptoms are well controlled on daily Nexium.  He underwent EGD in March 2000 showing a distal esophageal stricture and small hiatal hernia.  He notes diarrhea following meals on occasion.  This symptom has been stable for years.  Colonoscopy performed in July 2018 as below.  Abdominal ultrasound in June 2021 as below.  He underwent laparoscopic cholecystectomy with IOC on January 31, 2020 by Dr. Kae Heller for symptomatic cholelithiasis. IOC was negative. Denies weight loss, abdominal pain, constipation, change in stool caliber, melena, hematochezia, nausea, vomiting, dysphagia, chest pain.   Abd US IMPRESSION 12/28/2019: 1.  Cholelithiasis and sludge in gallbladder.  2. Apparent choledocholithiasis with a 5 mm presumed calculus in the proximal common bile duct. There is slight proximal common bile duct dilatation. No intrahepatic duct dilatation evident.  3. Multiple renal cysts, also noted on prior MR. Cyst noted in the liver with a septated cyst in the right lobe, also present on prior MR.  4. 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 Recommend followup by ultrasound in 2 years. This recommendation follows ACR consensus guidelines: White Paper of the ACR Incidental Findings Committee II on Vascular Findings. J Am Coll Radiol 2013; 10:789-794. 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 There is aortic atherosclerosis   Colonoscopy 01/2017 - One 5 mm polyp in the descending colon, removed with a cold snare. Resected and retrieved. - Mild diverticulosis in the left colon. There was no evidence of diverticular bleeding. - Internal hemorrhoids. - The examination was otherwise normal on direct and  retroflexion views.  Current Medications, Allergies, Past Medical History, Past Surgical History, Family History and Social History were reviewed in [employer] record.   Physical Exam: General: Well developed, well nourished, no acute distress Head: Normocephalic and atraumatic Eyes:  sclerae anicteric, EOMI Ears: Normal auditory acuity Mouth: Not examined, mask on during Covid-19 pandemic Lungs: Clear throughout to auscultation Heart: Regular rate and rhythm; no murmurs, rubs or bruits Abdomen: Soft, non tender and non distended. No masses, hepatosplenomegaly or hernias noted. Normal Bowel sounds Rectal: Not done Musculoskeletal: Symmetrical with no gross deformities  Pulses:  Normal pulses noted Extremities: No clubbing, cyanosis, edema or deformities noted Neurological: Alert oriented x 4, grossly nonfocal Psychological:  Alert and cooperative. Normal mood and affect   Assessment and Recommendations:  1. GERD.  Well-controlled.  Continue Nexium 40 mg p.o. daily and follow standard antireflux measures.  Recommended EGD to screen for Barrett's esophagus. He states he has an appointment soon with his PCP would like to have an opportunity to discuss this with his PCP before deciding on EGD. The risks (including bleeding, perforation, infection, missed lesions, medication reactions and possible hospitalization or surgery if complications occur), benefits, and alternatives to endoscopy with possible biopsy and possible dilation were discussed with the patient and they consent to proceed.   2. Weight loss, unexplained.  Recommended EGD to further evaluate unexplained weight loss.  He states he has an appointment soon with his PCP would like to have an opportunity to discuss this with his PCP before deciding on EGD.

## 2020-05-24 ENCOUNTER — Other Ambulatory Visit (HOSPITAL_COMMUNITY): Payer: Self-pay | Admitting: Family

## 2020-05-24 ENCOUNTER — Encounter: Payer: BC Managed Care – PPO | Admitting: Internal Medicine

## 2020-05-24 ENCOUNTER — Encounter: Payer: Self-pay | Admitting: Internal Medicine

## 2020-05-24 ENCOUNTER — Telehealth (HOSPITAL_COMMUNITY): Payer: Self-pay

## 2020-05-24 DIAGNOSIS — U071 COVID-19: Secondary | ICD-10-CM

## 2020-05-24 NOTE — Telephone Encounter (Signed)
Called to Discuss with patient about Covid symptoms and the use of the monoclonal antibody infusion for those with mild to moderate Covid symptoms and at a high risk of hospitalization.     Pt appears to qualify for this infusion due to co-morbid conditions and/or a member of an at-risk group in accordance with the FDA Emergency Use Authorization.    Risk factors include diabetes and hypertension. Sx onset 05/23/20 Positive at home test 05/24/20.  Pre-screened by RN and ready for APP to call and further discuss and/or schedule infusion appt.

## 2020-05-24 NOTE — Progress Notes (Signed)
I connected by phone with Jeff Jenkins on 05/24/2020 at 7:55 PM to discuss the potential use of a new treatment for mild to moderate COVID-19 viral infection in non-hospitalized patients.  This patient is a 64 y.o. male that meets the FDA criteria for Emergency Use Authorization of COVID monoclonal antibody casirivimab/imdevimab, bamlanivimab/eteseviamb, or sotrovimab.  Has a (+) direct SARS-CoV-2 viral test result  Has mild or moderate COVID-19   Is NOT hospitalized due to COVID-19  Is within 10 days of symptom onset  Has at least one of the high risk factor(s) for progression to severe COVID-19 and/or hospitalization as defined in EUA.  Specific high risk criteria : Diabetes and Cardiovascular disease or hypertension   Symptoms of cough, runny nose, fever, loss of taste began 05/23/20.   I have spoken and communicated the following to the patient or parent/caregiver regarding COVID monoclonal antibody treatment:  1. FDA has authorized the emergency use for the treatment of mild to moderate COVID-19 in adults and pediatric patients with positive results of direct SARS-CoV-2 viral testing who are 31 years of age and older weighing at least 40 kg, and who are at high risk for progressing to severe COVID-19 and/or hospitalization.  2. The significant known and potential risks and benefits of COVID monoclonal antibody, and the extent to which such potential risks and benefits are unknown.  3. Information on available alternative treatments and the risks and benefits of those alternatives, including clinical trials.  4. Patients treated with COVID monoclonal antibody should continue to self-isolate and use infection control measures (e.g., wear mask, isolate, social distance, avoid sharing personal items, clean and disinfect "high touch" surfaces, and frequent handwashing) according to CDC guidelines.   5. The patient or parent/caregiver has the option to accept or refuse COVID  monoclonal antibody treatment.  After reviewing this information with the patient, the patient has agreed to receive one of the available covid 19 monoclonal antibodies and will be provided an appropriate fact sheet prior to infusion. [name], NP 05/24/2020 7:55 PM

## 2020-05-25 ENCOUNTER — Ambulatory Visit (HOSPITAL_COMMUNITY)
Admission: RE | Admit: 2020-05-25 | Discharge: 2020-05-25 | Disposition: A | Discharge status: Home / Self Care | Payer: BC Managed Care – PPO | Source: Ambulatory Visit | Attending: Pulmonary Disease | Admitting: Pulmonary Disease

## 2020-05-25 DIAGNOSIS — I10 Essential (primary) hypertension: Secondary | ICD-10-CM | POA: Insufficient documentation

## 2020-05-25 DIAGNOSIS — U071 COVID-19: Secondary | ICD-10-CM | POA: Diagnosis not present

## 2020-05-25 DIAGNOSIS — E119 Type 2 diabetes mellitus without complications: Secondary | ICD-10-CM | POA: Diagnosis not present

## 2020-05-25 MED ORDER — FAMOTIDINE IN NACL 20-0.9 MG/50ML-% IV SOLN: 20.0000 mg | Freq: Once | INTRAVENOUS | Status: DC | PRN

## 2020-05-25 MED ORDER — EPINEPHRINE 0.3 MG/0.3ML IJ SOAJ: 0.3000 mg | Freq: Once | INTRAMUSCULAR | Status: DC | PRN

## 2020-05-25 MED ORDER — DIPHENHYDRAMINE HCL 50 MG/ML IJ SOLN: 50.0000 mg | Freq: Once | INTRAMUSCULAR | Status: DC | PRN

## 2020-05-25 MED ORDER — SODIUM CHLORIDE 0.9 % IV SOLN: INTRAVENOUS | Status: DC | PRN

## 2020-05-25 MED ORDER — SOTROVIMAB 500 MG/8ML IV SOLN
500.0000 mg | Freq: Once | INTRAVENOUS | Status: AC
  Administered 2020-05-25: 500 mg via INTRAVENOUS

## 2020-05-25 MED ORDER — METHYLPREDNISOLONE SODIUM SUCC 125 MG IJ SOLR: 125.0000 mg | Freq: Once | INTRAMUSCULAR | Status: DC | PRN

## 2020-05-25 MED ORDER — ALBUTEROL SULFATE HFA 108 (90 BASE) MCG/ACT IN AERS: 2.0000 | INHALATION_SPRAY | Freq: Once | RESPIRATORY_TRACT | Status: DC | PRN

## 2020-05-25 NOTE — Discharge Instructions (Signed)
10 Things You Can Do to Manage Your COVID-19 Symptoms at Home If you have possible or confirmed COVID-19: 1. Stay home from work and school. And stay away from other public places. If you must go out, avoid using any kind of public transportation, ridesharing, or taxis. 2. Monitor your symptoms carefully. If your symptoms get worse, call your healthcare provider immediately. 3. Get rest and stay hydrated. 4. If you have a medical appointment, call the healthcare provider ahead of time and tell them that you have or may have COVID-19. 5. For medical emergencies, call 911 and notify the dispatch personnel that you have or may have COVID-19. 6. Cover your cough and sneezes with a tissue or use the inside of your elbow. 7. Wash your hands often with soap and water for at least 20 seconds or clean your hands with an alcohol-based hand sanitizer that contains at least 60% alcohol. 8. As much as possible, stay in a specific room and away from other people in your home. Also, you should use a separate bathroom, if available. If you need to be around other people in or outside of the home, wear a mask. 9. Avoid sharing personal items with other people in your household, like dishes, towels, and bedding. 10. Clean all surfaces that are touched often, like counters, tabletops, and doorknobs. Use household cleaning sprays or wipes according to the label instructions. cdc.gov/coronavirus 01/13/2019 This information is not intended to replace advice given to you by your health care provider. Make sure you discuss any questions you have with your health care provider. Document Revised: 06/17/2019 Document Reviewed: 06/17/2019 Elsevier Patient Education  2020 Elsevier Inc.   COVID-19 COVID-19 is a respiratory infection that is caused by a virus called severe acute respiratory syndrome coronavirus 2 (SARS-CoV-2). The disease is also known as coronavirus disease or novel coronavirus. In some people, the virus may  not cause any symptoms. In others, it may cause a serious infection. The infection can get worse quickly and can lead to complications, such as:  Pneumonia, or infection of the lungs.  Acute respiratory distress syndrome or ARDS. This is a condition in which fluid build-up in the lungs prevents the lungs from filling with air and passing oxygen into the blood.  Acute respiratory failure. This is a condition in which there is not enough oxygen passing from the lungs to the body or when carbon dioxide is not passing from the lungs out of the body.  Sepsis or septic shock. This is a serious bodily reaction to an infection.  Blood clotting problems.  Secondary infections due to bacteria or fungus.  Organ failure. This is when your body's organs stop working. The virus that causes COVID-19 is contagious. This means that it can spread from person to person through droplets from coughs and sneezes (respiratory secretions). What are the causes? This illness is caused by a virus. You may catch the virus by:  Breathing in droplets from an infected person. Droplets can be spread by a person breathing, speaking, singing, coughing, or sneezing.  Touching something, like a table or a doorknob, that was exposed to the virus (contaminated) and then touching your mouth, nose, or eyes. What increases the risk? Risk for infection You are more likely to be infected with this virus if you:  Are within 6 feet (2 meters) of a person with COVID-19.  Provide care for or live with a person who is infected with COVID-19.  Spend time in crowded indoor spaces or   live in shared housing. Risk for serious illness You are more likely to become seriously ill from the virus if you:  Are 50 years of age or older. The higher your age, the more you are at risk for serious illness.  Live in a nursing home or long-term care facility.  Have cancer.  Have a long-term (chronic) disease such as: ? Chronic lung disease,  including chronic obstructive pulmonary disease or asthma. ? A long-term disease that lowers your body's ability to fight infection (immunocompromised). ? Heart disease, including heart failure, a condition in which the arteries that lead to the heart become narrow or blocked (coronary artery disease), a disease which makes the heart muscle thick, weak, or stiff (cardiomyopathy). ? Diabetes. ? Chronic kidney disease. ? Sickle cell disease, a condition in which red blood cells have an abnormal "sickle" shape. ? Liver disease.  Are obese. What are the signs or symptoms? Symptoms of this condition can range from mild to severe. Symptoms may appear any time from 2 to 14 days after being exposed to the virus. They include:  A fever or chills.  A cough.  Difficulty breathing.  Headaches, body aches, or muscle aches.  Runny or stuffy (congested) nose.  A sore throat.  New loss of taste or smell. Some people may also have stomach problems, such as nausea, vomiting, or diarrhea. Other people may not have any symptoms of COVID-19. How is this diagnosed? This condition may be diagnosed based on:  Your signs and symptoms, especially if: ? You live in an area with a COVID-19 outbreak. ? You recently traveled to or from an area where the virus is common. ? You provide care for or live with a person who was diagnosed with COVID-19. ? You were exposed to a person who was diagnosed with COVID-19.  A physical exam.  Lab tests, which may include: ? Taking a sample of fluid from the back of your nose and throat (nasopharyngeal fluid), your nose, or your throat using a swab. ? A sample of mucus from your lungs (sputum). ? Blood tests.  Imaging tests, which may include, X-rays, CT scan, or ultrasound. How is this treated? At present, there is no medicine to treat COVID-19. Medicines that treat other diseases are being used on a trial basis to see if they are effective against COVID-19. Your  health care provider will talk with you about ways to treat your symptoms. For most people, the infection is mild and can be managed at home with rest, fluids, and over-the-counter medicines. Treatment for a serious infection usually takes places in a hospital intensive care unit (ICU). It may include one or more of the following treatments. These treatments are given until your symptoms improve.  Receiving fluids and medicines through an IV.  Supplemental oxygen. Extra oxygen is given through a tube in the nose, a face mask, or a hood.  Positioning you to lie on your stomach (prone position). This makes it easier for oxygen to get into the lungs.  Continuous positive airway pressure (CPAP) or bi-level positive airway pressure (BPAP) machine. This treatment uses mild air pressure to keep the airways open. A tube that is connected to a motor delivers oxygen to the body.  Ventilator. This treatment moves air into and out of the lungs by using a tube that is placed in your windpipe.  Tracheostomy. This is a procedure to create a hole in the neck so that a breathing tube can be inserted.  Extracorporeal membrane   oxygenation (ECMO). This procedure gives the lungs a chance to recover by taking over the functions of the heart and lungs. It supplies oxygen to the body and removes carbon dioxide. Follow these instructions at home: Lifestyle  If you are sick, stay home except to get medical care. Your health care provider will tell you how long to stay home. Call your health care provider before you go for medical care.  Rest at home as told by your health care provider.  Do not use any products that contain nicotine or tobacco, such as cigarettes, e-cigarettes, and chewing tobacco. If you need help quitting, ask your health care provider.  Return to your normal activities as told by your health care provider. Ask your health care provider what activities are safe for you. General  instructions  Take over-the-counter and prescription medicines only as told by your health care provider.  Drink enough fluid to keep your urine pale yellow.  Keep all follow-up visits as told by your health care provider. This is important. How is this prevented?  There is no vaccine to help prevent COVID-19 infection. However, there are steps you can take to protect yourself and others from this virus. To protect yourself:   Do not travel to areas where COVID-19 is a risk. The areas where COVID-19 is reported change often. To identify high-risk areas and travel restrictions, check the CDC travel website: wwwnc.cdc.gov/travel/notices  If you live in, or must travel to, an area where COVID-19 is a risk, take precautions to avoid infection. ? Stay away from people who are sick. ? Wash your hands often with soap and water for 20 seconds. If soap and water are not available, use an alcohol-based hand sanitizer. ? Avoid touching your mouth, face, eyes, or nose. ? Avoid going out in public, follow guidance from your state and local health authorities. ? If you must go out in public, wear a cloth face covering or face mask. Make sure your mask covers your nose and mouth. ? Avoid crowded indoor spaces. Stay at least 6 feet (2 meters) away from others. ? Disinfect objects and surfaces that are frequently touched every day. This may include:  Counters and tables.  Doorknobs and light switches.  Sinks and faucets.  Electronics, such as phones, remote controls, keyboards, computers, and tablets. To protect others: If you have symptoms of COVID-19, take steps to prevent the virus from spreading to others.  If you think you have a COVID-19 infection, contact your health care provider right away. Tell your health care team that you think you may have a COVID-19 infection.  Stay home. Leave your house only to seek medical care. Do not use public transport.  Do not travel while you are  sick.  Wash your hands often with soap and water for 20 seconds. If soap and water are not available, use alcohol-based hand sanitizer.  Stay away from other members of your household. Let healthy household members care for children and pets, if possible. If you have to care for children or pets, wash your hands often and wear a mask. If possible, stay in your own room, separate from others. Use a different bathroom.  Make sure that all people in your household wash their hands well and often.  Cough or sneeze into a tissue or your sleeve or elbow. Do not cough or sneeze into your hand or into the air.  Wear a cloth face covering or face mask. Make sure your mask covers your nose   and mouth. Where to find more information  Centers for Disease Control and Prevention: www.cdc.gov/coronavirus/2019-ncov/index.html  World Health Organization: www.who.int/health-topics/coronavirus Contact a health care provider if:  You live in or have traveled to an area where COVID-19 is a risk and you have symptoms of the infection.  You have had contact with someone who has COVID-19 and you have symptoms of the infection. Get help right away if:  You have trouble breathing.  You have pain or pressure in your chest.  You have confusion.  You have bluish lips and fingernails.  You have difficulty waking from sleep.  You have symptoms that get worse. These symptoms may represent a serious problem that is an emergency. Do not wait to see if the symptoms will go away. Get medical help right away. Call your local emergency services (911 in the [place] Do not drive yourself to the hospital. Let the emergency medical personnel know if you think you have COVID-19. Summary  COVID-19 is a respiratory infection that is caused by a virus. It is also known as coronavirus disease or novel coronavirus. It can cause serious infections, such as pneumonia, acute respiratory distress syndrome, acute respiratory failure,  or sepsis.  The virus that causes COVID-19 is contagious. This means that it can spread from person to person through droplets from breathing, speaking, singing, coughing, or sneezing.  You are more likely to develop a serious illness if you are 50 years of age or older, have a weak immune system, live in a nursing home, or have chronic disease.  There is no medicine to treat COVID-19. Your health care provider will talk with you about ways to treat your symptoms.  Take steps to protect yourself and others from infection. Wash your hands often and disinfect objects and surfaces that are frequently touched every day. Stay away from people who are sick and wear a mask if you are sick. This information is not intended to replace advice given to you by your health care provider. Make sure you discuss any questions you have with your health care provider. Document Revised: 04/30/2019 Document Reviewed: 08/06/2018 Elsevier Patient Education  2020 Elsevier Inc.  What types of side effects do monoclonal antibody drugs cause?  Common side effects  In general, the more common side effects caused by monoclonal antibody drugs include: . Allergic reactions, such as hives or itching . Flu-like signs and symptoms, including chills, fatigue, fever, and muscle aches and pains . Nausea, vomiting . Diarrhea . Skin rashes . Low blood pressure   The CDC is recommending patients who receive monoclonal antibody treatments wait at least 90 days before being vaccinated.  Currently, there are no data on the safety and efficacy of mRNA COVID-19 vaccines in persons who received monoclonal antibodies or convalescent plasma as part of COVID-19 treatment. Based on the estimated half-life of such therapies as well as evidence suggesting that reinfection is uncommon in the 90 days after initial infection, vaccination should be deferred for at least 90 days, as a precautionary measure until additional information becomes  available, to avoid interference of the antibody treatment with vaccine-induced immune responses.  

## 2020-05-25 NOTE — Progress Notes (Signed)
  Diagnosis: COVID-19  Physician: Dr. Asencion Noble   Procedure: Allergies reviewed.  Sotrovimab administered via IV infusion after med fact sheet provided to patient and all questions answered. Discharge instructions provided to patient; all questions answered.   Complications: No immediate complications noted.  Discharge: Discharged home   Monna Fam 05/25/2020

## 2020-06-02 ENCOUNTER — Encounter: Payer: Self-pay | Admitting: Internal Medicine

## 2020-06-03 ENCOUNTER — Other Ambulatory Visit: Payer: Self-pay | Admitting: Gastroenterology

## 2020-06-07 ENCOUNTER — Encounter: Payer: Self-pay | Admitting: Gastroenterology

## 2020-06-07 ENCOUNTER — Other Ambulatory Visit: Payer: Self-pay | Admitting: Internal Medicine

## 2020-06-14 ENCOUNTER — Encounter: Payer: BC Managed Care – PPO | Admitting: Gastroenterology

## 2020-06-22 ENCOUNTER — Other Ambulatory Visit: Payer: Self-pay

## 2020-06-22 ENCOUNTER — Ambulatory Visit (INDEPENDENT_AMBULATORY_CARE_PROVIDER_SITE_OTHER): Payer: BC Managed Care – PPO | Admitting: Internal Medicine

## 2020-06-22 VITALS — BP 140/84 | HR 89 | Temp 97.6°F | Ht 72.0 in | Wt 163.6 lb

## 2020-06-22 DIAGNOSIS — E7800 Pure hypercholesterolemia, unspecified: Secondary | ICD-10-CM | POA: Diagnosis not present

## 2020-06-22 DIAGNOSIS — K219 Gastro-esophageal reflux disease without esophagitis: Secondary | ICD-10-CM

## 2020-06-22 DIAGNOSIS — E119 Type 2 diabetes mellitus without complications: Secondary | ICD-10-CM

## 2020-06-22 DIAGNOSIS — Z01818 Encounter for other preprocedural examination: Secondary | ICD-10-CM

## 2020-06-22 DIAGNOSIS — Z0000 Encounter for general adult medical examination without abnormal findings: Secondary | ICD-10-CM | POA: Diagnosis not present

## 2020-06-22 DIAGNOSIS — I2510 Atherosclerotic heart disease of native coronary artery without angina pectoris: Secondary | ICD-10-CM

## 2020-06-22 DIAGNOSIS — R634 Abnormal weight loss: Secondary | ICD-10-CM | POA: Diagnosis not present

## 2020-06-22 NOTE — Patient Instructions (Signed)
-Nice seeing you today!!  -Lab work today; will notify you once results are available.  -Follow up with GI for EGD soon.  -Schedule follow up in 3 months.   Preventive Care 32-64 Years Old, Male Preventive care refers to lifestyle choices and visits with your health care provider that can promote health and wellness. This includes:  A yearly physical exam. This is also called an annual well check.  Regular dental and eye exams.  Immunizations.  Screening for certain conditions.  Healthy lifestyle choices, such as eating a healthy diet, getting regular exercise, not using drugs or products that contain nicotine and tobacco, and limiting alcohol use. What can I expect for my preventive care visit? Physical exam Your health care provider will check:  Height and weight. These may be used to calculate body mass index (BMI), which is a measurement that tells if you are at a healthy weight.  Heart rate and blood pressure.  Your skin for abnormal spots. Counseling Your health care provider may ask you questions about:  Alcohol, tobacco, and drug use.  Emotional well-being.  Home and relationship well-being.  Sexual activity.  Eating habits.  Work and work Statistician. What immunizations do I need?  Influenza (flu) vaccine  This is recommended every year. Tetanus, diphtheria, and pertussis (Tdap) vaccine  You may need a Td booster every 10 years. Varicella (chickenpox) vaccine  You may need this vaccine if you have not already been vaccinated. Zoster (shingles) vaccine  You may need this after age 68. Measles, mumps, and rubella (MMR) vaccine  You may need at least one dose of MMR if you were born in 1957 or later. You may also need a second dose. Pneumococcal conjugate (PCV13) vaccine  You may need this if you have certain conditions and were not previously vaccinated. Pneumococcal polysaccharide (PPSV23) vaccine  You may need one or two doses if you smoke  cigarettes or if you have certain conditions. Meningococcal conjugate (MenACWY) vaccine  You may need this if you have certain conditions. Hepatitis A vaccine  You may need this if you have certain conditions or if you travel or work in places where you may be exposed to hepatitis A. Hepatitis B vaccine  You may need this if you have certain conditions or if you travel or work in places where you may be exposed to hepatitis B. Haemophilus influenzae type b (Hib) vaccine  You may need this if you have certain risk factors. Human papillomavirus (HPV) vaccine  If recommended by your health care provider, you may need three doses over 6 months. You may receive vaccines as individual doses or as more than one vaccine together in one shot (combination vaccines). Talk with your health care provider about the risks and benefits of combination vaccines. What tests do I need? Blood tests  Lipid and cholesterol levels. These may be checked every 5 years, or more frequently if you are over 29 years old.  Hepatitis C test.  Hepatitis B test. Screening  Lung cancer screening. You may have this screening every year starting at age 40 if you have a 30-pack-year history of smoking and currently smoke or have quit within the past 15 years.  Prostate cancer screening. Recommendations will vary depending on your family history and other risks.  Colorectal cancer screening. All adults should have this screening starting at age 28 and continuing until age 8. Your health care provider may recommend screening at age 25 if you are at increased risk. You will  have tests every 1-10 years, depending on your results and the type of screening test.  Diabetes screening. This is done by checking your blood sugar (glucose) after you have not eaten for a while (fasting). You may have this done every 1-3 years.  Sexually transmitted disease (STD) testing. Follow these instructions at home: Eating and  drinking  Eat a diet that includes fresh fruits and vegetables, whole grains, lean protein, and low-fat dairy products.  Take vitamin and mineral supplements as recommended by your health care provider.  Do not drink alcohol if your health care provider tells you not to drink.  If you drink alcohol: ? Limit how much you have to 0-2 drinks a day. ? Be aware of how much alcohol is in your drink. In the [place] one drink equals one 12 oz bottle of beer (355 mL), one 5 oz glass of wine (148 mL), or one 1 oz glass of hard liquor (44 mL). Lifestyle  Take daily care of your teeth and gums.  Stay active. Exercise for at least 30 minutes on 5 or more days each week.  Do not use any products that contain nicotine or tobacco, such as cigarettes, e-cigarettes, and chewing tobacco. If you need help quitting, ask your health care provider.  If you are sexually active, practice safe sex. Use a condom or other form of protection to prevent STIs (sexually transmitted infections).  Talk with your health care provider about taking a low-dose aspirin every day starting at age 61. What's next?  Go to your health care provider once a year for a well check visit.  Ask your health care provider how often you should have your eyes and teeth checked.  Stay up to date on all vaccines. This information is not intended to replace advice given to you by your health care provider. Make sure you discuss any questions you have with your health care provider. Document Revised: 06/25/2018 Document Reviewed: 06/25/2018 Elsevier Patient Education  2020 [employer].

## 2020-06-22 NOTE — Progress Notes (Signed)
Established Patient Office Visit     This visit occurred during the SARS-CoV-2 public health emergency.  Safety protocols were in place, including screening questions prior to the visit, additional usage of staff PPE, and extensive cleaning of exam room while observing appropriate contact time as indicated for disinfecting solutions.    CC/Reason for Visit: Annual preventive exam, preoperative clearance, unexplained weight loss  HPI: Jeff Jenkins is a 64 y.o. male who is coming in today for the above mentioned reasons. Past Medical History is significant for: Type 2 diabetes that is a rather recent diagnoses, hyperlipidemia, hypertension, GERD, coronary artery disease with significant coronary calcifications per cardiac CT scan.  Over the summer he had laparoscopic cholecystectomy for cholelithiasis.  He has significant right knee pain and is scheduled for a right total knee replacement on December 20.  He is here today for preoperative clearance.  He has a history of testosterone deficiency and erectile dysfunction on Cialis.  He has a known history of whitecoat hypertension.  He checks blood pressure routinely at home with systolics in the low 433I to 110s over 60s to 70s on average.  In the office his measurements are always around 140/80.  He has been losing significant amount of weight.  He states that in 2019 he weighed around 190 pounds.  Today he weighs 163.  His appetite has been good.  He has no abdominal pain, no issues swallowing.  He has some loose stools that he associates with Synjardy and Nexium but these have not increased in frequency in the last few months to years.  He unfortunately tested positive for Covid on November 10.  He received his monoclonal antibody infusion on November 11.  He has recovered well although he still remains somewhat fatigued.  He saw Dr. Fuller Plan and has been scheduled for an EGD to rule out Barrett's esophagus due to his longstanding history of  GERD and continued symptoms of acid reflux, unfortunately this had to be canceled due to his positive Covid test.  We have done a diet log, he is eating a good amount of food.   Past Medical/Surgical History: Past Medical History:  Diagnosis Date  . Arthritis   . Broken ankle 2008  . Diverticulosis   . ED (erectile dysfunction)   . Facial fracture ([place]) 1979  . GERD (gastroesophageal reflux disease)   . Hearing loss in right ear   . Hemorrhoids   . Hiatal hernia   . Peptic stricture of esophagus   . Peptic ulcer disease 1989  . Prostate cancer ([place]) 02/2009  . Seasonal allergies   . Type 2 diabetes mellitus ([place])     Past Surgical History:  Procedure Laterality Date  . ANKLE SURGERY Left 2008  . CHOLECYSTECTOMY N/A 01/31/2020   Procedure: LAPAROSCOPIC CHOLECYSTECTOMY WITH INTRAOPERATIVE CHOLANGIOGRAM;  Surgeon: Clovis Riley, MD;  Location: [facility];  Service: General;  Laterality: N/A;  . FACIAL FRACTURE SURGERY    . INGUINAL HERNIA REPAIR Left   . KNEE ARTHROSCOPY Left 2013  . KNEE ARTHROSCOPY  [id number]   [facility]  . PROSTATECTOMY  2011  . TONSILLECTOMY    . VASECTOMY      Social History:  reports that he has never smoked. He has never used smokeless tobacco. He reports current alcohol use of about 12.0 standard drinks of alcohol per week. He reports that he does not use drugs.  Allergies: No Known Allergies  Family History:  Family History  Problem Relation  Age of Onset  . Breast cancer Mother   . Diabetes Mother   . Heart attack Mother   . Alcohol abuse Father   . Stroke Brother   . Colon cancer Neg Hx   . Esophageal cancer Neg Hx   . Rectal cancer Neg Hx   . Stomach cancer Neg Hx      Current Outpatient Medications:  .  acetaminophen (TYLENOL) 325 MG tablet, Take 650 mg by mouth every 6 (six) hours as needed for moderate pain., Disp: , Rfl:  .  aspirin EC 81 MG tablet, Take 81 mg by mouth daily. Swallow whole., Disp: , Rfl:  .  atorvastatin  (LIPITOR) 40 MG tablet, TAKE 1 TABLET BY MOUTH EVERY DAY, Disp: 90 tablet, Rfl: 1 .  azelastine (ASTELIN) 0.1 % nasal spray, Place 1 spray into both nostrils 2 (two) times daily as needed for allergies. Use in each nostril as directed, Disp: , Rfl:  .  diclofenac Sodium (VOLTAREN) 1 % GEL, Apply 2 g topically in the morning, at noon, and at bedtime., Disp: , Rfl:  .  esomeprazole (NEXIUM) 40 MG capsule, TAKE 1 CAPSULE DAILY BEFORE BREAKFAST., Disp: 90 capsule, Rfl: 3 .  ezetimibe (ZETIA) 10 MG tablet, Take 10 mg by mouth daily., Disp: , Rfl:  .  fexofenadine (ALLEGRA) 180 MG tablet, Take 180 mg by mouth daily., Disp: , Rfl:  .  ipratropium (ATROVENT) 0.03 % nasal spray, INSTILL 2 SPRAYS IN EACH NOSTRIL TWICE A DAY, Disp: 30 mL, Rfl: 2 .  Lancets (ONETOUCH ULTRASOFT) lancets, 1 each by Other route daily. Dx E11.9, Disp: 100 each, Rfl: 3 .  lisinopril (ZESTRIL) 5 MG tablet, Take 5 mg by mouth daily., Disp: , Rfl:  .  ONETOUCH ULTRA test strip, CHECK BLOOD SUGAR DAILY, Disp: 100 strip, Rfl: 3 .  sildenafil (VIAGRA) 100 MG tablet, TAKE 1 TABLET BY MOUTH DAILY AS NEEDED, Disp: 10 tablet, Rfl: 11 .  SYNJARDY XR 25-1000 MG TB24, Take 1 tablet by mouth daily. , Disp: , Rfl:  .  tadalafil (CIALIS) 5 MG tablet, Take 5 mg by mouth daily as needed for erectile dysfunction. , Disp: , Rfl:  .  Testosterone 30 MG/ACT SOLN, Place onto the skin daily., Disp: , Rfl:   Review of Systems:  Constitutional: Denies fever, chills, diaphoresis, appetite change. HEENT: Denies photophobia, eye pain, redness, hearing loss, ear pain, congestion, sore throat, rhinorrhea, sneezing, mouth sores, trouble swallowing, neck pain, neck stiffness and tinnitus.   Respiratory: Denies SOB, DOE, cough, chest tightness,  and wheezing.   Cardiovascular: Denies chest pain, palpitations and leg swelling.  Gastrointestinal: Denies nausea, vomiting, abdominal pain, diarrhea, constipation, blood in stool and abdominal distention.   Genitourinary: Denies dysuria, urgency, frequency, hematuria, flank pain and difficulty urinating.  Endocrine: Denies: hot or cold intolerance, sweats, changes in hair or nails, polyuria, polydipsia. Musculoskeletal: Denies myalgias, back pain, joint swelling, arthralgias and gait problem.  Skin: Denies pallor, rash and wound.  Neurological: Denies dizziness, seizures, syncope, weakness, light-headedness, numbness and headaches.  Hematological: Denies adenopathy. Easy bruising, personal or family bleeding history  Psychiatric/Behavioral: Denies suicidal ideation, mood changes, confusion, nervousness, sleep disturbance and agitation    Physical Exam: Vitals:   06/22/20 0956  BP: 140/84  Pulse: 89  Temp: 97.6 F (36.4 C)  TempSrc: Oral  SpO2: 93%  Weight: 163 lb 9.6 oz (74.2 kg)  Height: 6' (1.829 m)    Body mass index is 22.19 kg/m.   Constitutional: NAD, calm, comfortable Eyes:  PERRL, lids and conjunctivae normal ENMT: Mucous membranes are moist. Posterior pharynx clear of any exudate or lesions. Normal dentition. Tympanic membrane is pearly white, no erythema or bulging. Neck: normal, supple, no masses, no thyromegaly Respiratory: clear to auscultation bilaterally, no wheezing, no crackles. Normal respiratory effort. No accessory muscle use.  Cardiovascular: Regular rate and rhythm, no murmurs / rubs / gallops. No extremity edema. 2+ pedal pulses. Abdomen: no tenderness, no masses palpated. No hepatosplenomegaly. Bowel sounds positive.  Musculoskeletal: no clubbing / cyanosis. No joint deformity upper and lower extremities. Good ROM, no contractures. Normal muscle tone.  Skin: no rashes, lesions, ulcers. No induration Neurologic: CN 2-12 grossly intact. Sensation intact, DTR normal. Strength 5/5 in all 4.  Psychiatric: Normal judgment and insight. Alert and oriented x 3. Normal mood.    Impression and Plan:  Encounter for preventive health examination -Have advised  routine eye and dental care. -Have not had time to discuss immunizations but will address at next visit in 3 months. -Healthy lifestyle discussed in detail. -Screening labs today. -He has a history of prostate cancer and is followed by urology twice a year. -He had a colonoscopy in 2018.  Preoperative clearance -EKG done in office today and interpreted by myself as normal sinus rhythm, some PVCs, no acute ST or T wave changes. -He has no shortness of breath or chest pain on exertion.  It is now deer season and he has been very active walking to and from his stations and up into the stance and has not experienced any chest pain or trouble breathing. -Barring any major lab abnormalities, I believe it would be okay for him to proceed to surgery. -He does have a high coronary calcium CT score but he saw Dr. Harrell Gave in August.  There were no plans for cardiac catheterization at that time and he has not developed any new symptoms.  Weight loss  - Plan: TSH, Vitamin B12, VITAMIN D 25 Hydroxy (Vit-D Deficiency, Fractures) -Have advised that he reschedule his EGD as soon as possible. -Can consider chest and abdomen CT scan for further evaluation. -I suspect that Synjardy might be contributing to his weight loss.  Can consider changing medications if above are negative.  Type 2 diabetes mellitus without obesity (HCC)  -Check A1c. -Last A1c was well within goal at 6.2.  Pure hypercholesterolemia  - Plan: Lipid panel -Last LDL was 87 in April 2021. -He remains on atorvastatin.  Gastroesophageal reflux disease, unspecified whether esophagitis present -Continue daily Nexium, plan for EGD in the near future.  Coronary artery calcification seen on CT scan -Followed by cardiology, currently under medical management.    Patient Instructions  -Nice seeing you today!!  -Lab work today; will notify you once results are available.  -Follow up with GI for EGD soon.  -Schedule follow up in 3  months.   Preventive Care 58-31 Years Old, Male Preventive care refers to lifestyle choices and visits with your health care provider that can promote health and wellness. This includes:  A yearly physical exam. This is also called an annual well check.  Regular dental and eye exams.  Immunizations.  Screening for certain conditions.  Healthy lifestyle choices, such as eating a healthy diet, getting regular exercise, not using drugs or products that contain nicotine and tobacco, and limiting alcohol use. What can I expect for my preventive care visit? Physical exam Your health care provider will check:  Height and weight. These may be used to calculate body mass index (BMI),  which is a measurement that tells if you are at a healthy weight.  Heart rate and blood pressure.  Your skin for abnormal spots. Counseling Your health care provider may ask you questions about:  Alcohol, tobacco, and drug use.  Emotional well-being.  Home and relationship well-being.  Sexual activity.  Eating habits.  Work and work Statistician. What immunizations do I need?  Influenza (flu) vaccine  This is recommended every year. Tetanus, diphtheria, and pertussis (Tdap) vaccine  You may need a Td booster every 10 years. Varicella (chickenpox) vaccine  You may need this vaccine if you have not already been vaccinated. Zoster (shingles) vaccine  You may need this after age 74. Measles, mumps, and rubella (MMR) vaccine  You may need at least one dose of MMR if you were born in 1957 or later. You may also need a second dose. Pneumococcal conjugate (PCV13) vaccine  You may need this if you have certain conditions and were not previously vaccinated. Pneumococcal polysaccharide (PPSV23) vaccine  You may need one or two doses if you smoke cigarettes or if you have certain conditions. Meningococcal conjugate (MenACWY) vaccine  You may need this if you have certain conditions. Hepatitis A  vaccine  You may need this if you have certain conditions or if you travel or work in places where you may be exposed to hepatitis A. Hepatitis B vaccine  You may need this if you have certain conditions or if you travel or work in places where you may be exposed to hepatitis B. Haemophilus influenzae type b (Hib) vaccine  You may need this if you have certain risk factors. Human papillomavirus (HPV) vaccine  If recommended by your health care provider, you may need three doses over 6 months. You may receive vaccines as individual doses or as more than one vaccine together in one shot (combination vaccines). Talk with your health care provider about the risks and benefits of combination vaccines. What tests do I need? Blood tests  Lipid and cholesterol levels. These may be checked every 5 years, or more frequently if you are over 56 years old.  Hepatitis C test.  Hepatitis B test. Screening  Lung cancer screening. You may have this screening every year starting at age 45 if you have a 30-pack-year history of smoking and currently smoke or have quit within the past 15 years.  Prostate cancer screening. Recommendations will vary depending on your family history and other risks.  Colorectal cancer screening. All adults should have this screening starting at age 55 and continuing until age 12. Your health care provider may recommend screening at age 74 if you are at increased risk. You will have tests every 1-10 years, depending on your results and the type of screening test.  Diabetes screening. This is done by checking your blood sugar (glucose) after you have not eaten for a while (fasting). You may have this done every 1-3 years.  Sexually transmitted disease (STD) testing. Follow these instructions at home: Eating and drinking  Eat a diet that includes fresh fruits and vegetables, whole grains, lean protein, and low-fat dairy products.  Take vitamin and mineral supplements as  recommended by your health care provider.  Do not drink alcohol if your health care provider tells you not to drink.  If you drink alcohol: ? Limit how much you have to 0-2 drinks a day. ? Be aware of how much alcohol is in your drink. In the [place] one drink equals one 12 oz bottle of  beer (355 mL), one 5 oz glass of wine (148 mL), or one 1 oz glass of hard liquor (44 mL). Lifestyle  Take daily care of your teeth and gums.  Stay active. Exercise for at least 30 minutes on 5 or more days each week.  Do not use any products that contain nicotine or tobacco, such as cigarettes, e-cigarettes, and chewing tobacco. If you need help quitting, ask your health care provider.  If you are sexually active, practice safe sex. Use a condom or other form of protection to prevent STIs (sexually transmitted infections).  Talk with your health care provider about taking a low-dose aspirin every day starting at age 68. What's next?  Go to your health care provider once a year for a well check visit.  Ask your health care provider how often you should have your eyes and teeth checked.  Stay up to date on all vaccines. This information is not intended to replace advice given to you by your health care provider. Make sure you discuss any questions you have with your health care provider. Document Revised: 06/25/2018 Document Reviewed: 06/25/2018 Elsevier Patient Education  2020 [place], [place] [facility]

## 2020-06-23 LAB — CBC WITH DIFFERENTIAL/PLATELET
Absolute Monocytes: 561 cells/uL (ref 200–950)
Basophils Absolute: 18 cells/uL (ref 0–200)
Basophils Relative: 0.3 %
Eosinophils Absolute: 59 cells/uL (ref 15–500)
Eosinophils Relative: 1 %
HCT: 46.3 % (ref 38.5–50.0)
Hemoglobin: 15.8 g/dL (ref 13.2–17.1)
Lymphs Abs: 1304 cells/uL (ref 850–3900)
MCH: 31.9 pg (ref 27.0–33.0)
MCHC: 34.1 g/dL (ref 32.0–36.0)
MCV: 93.5 fL (ref 80.0–100.0)
MPV: 11.7 fL (ref 7.5–12.5)
Monocytes Relative: 9.5 %
Neutro Abs: 3959 cells/uL (ref 1500–7800)
Neutrophils Relative %: 67.1 %
Platelets: 170 10*3/uL (ref 140–400)
RBC: 4.95 10*6/uL (ref 4.20–5.80)
RDW: 12.1 % (ref 11.0–15.0)
Total Lymphocyte: 22.1 %
WBC: 5.9 10*3/uL (ref 3.8–10.8)

## 2020-06-23 LAB — LIPID PANEL
Cholesterol: 139 mg/dL (ref <200)
HDL: 73 mg/dL (ref >=40)
LDL Cholesterol (Calc): 53 mg/dL (calc)
Non-HDL Cholesterol (Calc): 66 mg/dL (calc) (ref <130)
Total CHOL/HDL Ratio: 1.9 (calc) (ref <5.0)
Triglycerides: 57 mg/dL (ref <150)

## 2020-06-23 LAB — HEMOGLOBIN A1C
Hgb A1c MFr Bld: 5.9 % of total Hgb — ABNORMAL HIGH (ref <5.7)
Mean Plasma Glucose: 123 mg/dL
eAG (mmol/L): 6.8 mmol/L

## 2020-06-23 LAB — COMPREHENSIVE METABOLIC PANEL
AG Ratio: 2 (calc) (ref 1.0–2.5)
ALT: 19 U/L (ref 9–46)
AST: 17 U/L (ref 10–35)
Albumin: 4.7 g/dL (ref 3.6–5.1)
Alkaline phosphatase (APISO): 96 U/L (ref 35–144)
BUN: 18 mg/dL (ref 7–25)
CO2: 28 mmol/L (ref 20–32)
Calcium: 10.3 mg/dL (ref 8.6–10.3)
Chloride: 102 mmol/L (ref 98–110)
Creat: 0.74 mg/dL (ref 0.70–1.25)
Globulin: 2.4 g/dL (calc) (ref 1.9–3.7)
Glucose, Bld: 112 mg/dL — ABNORMAL HIGH (ref 65–99)
Potassium: 4.5 mmol/L (ref 3.5–5.3)
Sodium: 140 mmol/L (ref 135–146)
Total Bilirubin: 1.2 mg/dL (ref 0.2–1.2)
Total Protein: 7.1 g/dL (ref 6.1–8.1)

## 2020-06-23 LAB — VITAMIN B12: Vitamin B-12: 269 pg/mL (ref 200–1100)

## 2020-06-23 LAB — TSH: TSH: 1.12 mIU/L (ref 0.40–4.50)

## 2020-06-23 LAB — VITAMIN D 25 HYDROXY (VIT D DEFICIENCY, FRACTURES): Vit D, 25-Hydroxy: 30 ng/mL (ref 30–100)

## 2020-07-03 HISTORY — PX: KNEE ARTHROSCOPY: SUR90

## 2020-07-26 DIAGNOSIS — Z8546 Personal history of malignant neoplasm of prostate: Secondary | ICD-10-CM | POA: Diagnosis not present

## 2020-07-26 DIAGNOSIS — E230 Hypopituitarism: Secondary | ICD-10-CM | POA: Diagnosis not present

## 2020-08-01 ENCOUNTER — Encounter: Payer: BC Managed Care – PPO | Admitting: Internal Medicine

## 2020-08-02 DIAGNOSIS — N5231 Erectile dysfunction following radical prostatectomy: Secondary | ICD-10-CM | POA: Diagnosis not present

## 2020-08-02 DIAGNOSIS — E230 Hypopituitarism: Secondary | ICD-10-CM | POA: Diagnosis not present

## 2020-08-02 DIAGNOSIS — K4091 Unilateral inguinal hernia, without obstruction or gangrene, recurrent: Secondary | ICD-10-CM | POA: Diagnosis not present

## 2020-08-02 DIAGNOSIS — Z8546 Personal history of malignant neoplasm of prostate: Secondary | ICD-10-CM | POA: Diagnosis not present

## 2020-08-02 DIAGNOSIS — N393 Stress incontinence (female) (male): Secondary | ICD-10-CM | POA: Diagnosis not present

## 2020-08-08 DIAGNOSIS — H2513 Age-related nuclear cataract, bilateral: Secondary | ICD-10-CM | POA: Diagnosis not present

## 2020-08-08 DIAGNOSIS — H35033 Hypertensive retinopathy, bilateral: Secondary | ICD-10-CM | POA: Diagnosis not present

## 2020-08-08 DIAGNOSIS — E119 Type 2 diabetes mellitus without complications: Secondary | ICD-10-CM | POA: Diagnosis not present

## 2020-08-08 DIAGNOSIS — H524 Presbyopia: Secondary | ICD-10-CM | POA: Diagnosis not present

## 2020-08-08 DIAGNOSIS — I10 Essential (primary) hypertension: Secondary | ICD-10-CM | POA: Diagnosis not present

## 2020-08-08 LAB — HM DIABETES EYE EXAM

## 2020-08-17 ENCOUNTER — Encounter: Payer: Self-pay | Admitting: Internal Medicine

## 2020-08-18 DIAGNOSIS — M1712 Unilateral primary osteoarthritis, left knee: Secondary | ICD-10-CM | POA: Diagnosis not present

## 2020-08-18 DIAGNOSIS — Z471 Aftercare following joint replacement surgery: Secondary | ICD-10-CM | POA: Diagnosis not present

## 2020-08-18 DIAGNOSIS — Z96651 Presence of right artificial knee joint: Secondary | ICD-10-CM | POA: Diagnosis not present

## 2020-08-21 ENCOUNTER — Encounter: Payer: Self-pay | Admitting: Gastroenterology

## 2020-08-21 ENCOUNTER — Other Ambulatory Visit: Payer: Self-pay

## 2020-08-21 ENCOUNTER — Ambulatory Visit (AMBULATORY_SURGERY_CENTER): Payer: Medicare HMO | Admitting: Gastroenterology

## 2020-08-21 VITALS — BP 133/83 | HR 84 | Temp 98.4°F | Resp 14 | Ht 72.0 in | Wt 169.0 lb

## 2020-08-21 DIAGNOSIS — K219 Gastro-esophageal reflux disease without esophagitis: Secondary | ICD-10-CM

## 2020-08-21 DIAGNOSIS — E119 Type 2 diabetes mellitus without complications: Secondary | ICD-10-CM | POA: Diagnosis not present

## 2020-08-21 DIAGNOSIS — K2970 Gastritis, unspecified, without bleeding: Secondary | ICD-10-CM

## 2020-08-21 DIAGNOSIS — R634 Abnormal weight loss: Secondary | ICD-10-CM | POA: Diagnosis not present

## 2020-08-21 DIAGNOSIS — K2950 Unspecified chronic gastritis without bleeding: Secondary | ICD-10-CM | POA: Diagnosis not present

## 2020-08-21 DIAGNOSIS — K319 Disease of stomach and duodenum, unspecified: Secondary | ICD-10-CM | POA: Diagnosis not present

## 2020-08-21 DIAGNOSIS — I2510 Atherosclerotic heart disease of native coronary artery without angina pectoris: Secondary | ICD-10-CM | POA: Diagnosis not present

## 2020-08-21 DIAGNOSIS — K222 Esophageal obstruction: Secondary | ICD-10-CM

## 2020-08-21 DIAGNOSIS — K449 Diaphragmatic hernia without obstruction or gangrene: Secondary | ICD-10-CM

## 2020-08-21 DIAGNOSIS — I10 Essential (primary) hypertension: Secondary | ICD-10-CM | POA: Diagnosis not present

## 2020-08-21 MED ORDER — SODIUM CHLORIDE 0.9 % IV SOLN: 500.0000 mL | Freq: Once | INTRAVENOUS | Status: DC

## 2020-08-21 NOTE — Op Note (Signed)
[place] Patient Name: Jeff Jenkins Procedure Date: 08/21/2020 7:36 AM MRN: [id number] Endoscopist: Ladene Artist , MD Age: 65 Referring MD:  Date of Birth: [birth year] Gender: Male Account #: [id number] Procedure:                Upper GI endoscopy Indications:              Gastroesophageal reflux disease, Weight loss Medicines:                Monitored Anesthesia Care Procedure:                Pre-Anesthesia Assessment:                           - Prior to the procedure, a History and Physical                            was performed, and patient medications and                            allergies were reviewed. The patient's tolerance of                            previous anesthesia was also reviewed. The risks                            and benefits of the procedure and the sedation                            options and risks were discussed with the patient.                            All questions were answered, and informed consent                            was obtained. Prior Anticoagulants: The patient has                            taken no previous anticoagulant or antiplatelet                            agents. ASA Grade Assessment: II - A patient with                            mild systemic disease. After reviewing the risks                            and benefits, the patient was deemed in                            satisfactory condition to undergo the procedure.                           After obtaining informed consent, the endoscope was  passed under direct vision. Throughout the                            procedure, the patient's blood pressure, pulse, and                            oxygen saturations were monitored continuously. The                            Endoscope was introduced through the mouth, and                            advanced to the second part of duodenum. The upper                            GI endoscopy  was accomplished without difficulty.                            The patient tolerated the procedure well. Scope In: Scope Out: Findings:                 One benign-appearing, intrinsic mild stenosis was                            found at the gastroesophageal junction. This                            stenosis measured 1.5 cm (inner diameter) x less                            than one cm (in length). The stenosis was traversed.                           The exam of the esophagus was otherwise normal.                           Diffuse mildly erythematous mucosa without bleeding                            was found in the gastric body and in the gastric                            antrum. Biopsies were taken with a cold forceps for                            histology.                           A small hiatal hernia was present.                           The exam of the stomach was otherwise normal.  The duodenal bulb and second portion of the                            duodenum were normal. Complications:            No immediate complications. Estimated Blood Loss:     Estimated blood loss was minimal. Impression:               - Benign-appearing, non-obstructing esophageal                            stenosis.                           - Erythematous mucosa in the gastric body and                            antrum. Biopsied.                           - Small hiatal hernia.                           - Normal duodenal bulb and second portion of the                            duodenum. Recommendation:           - Patient has a contact number available for                            emergencies. The signs and symptoms of potential                            delayed complications were discussed with the                            patient. Return to normal activities tomorrow.                            Written discharge instructions were provided to the                             patient.                           - Resume previous diet.                           - Follow antireflux measures.                           - Continue present medications.                           - Await pathology results. Ladene Artist, MD 08/21/2020 8:18:44 AM This report has been signed electronically.

## 2020-08-21 NOTE — Progress Notes (Signed)
VS-CW 

## 2020-08-21 NOTE — Patient Instructions (Signed)
Read all fo the handouts given to you by your recovery room nurse.  YOU HAD AN ENDOSCOPIC PROCEDURE TODAY AT [facility]:   Refer to the procedure report that was given to you for any specific questions about what was found during the examination.  If the procedure report does not answer your questions, please call your gastroenterologist to clarify.  If you requested that your care partner not be given the details of your procedure findings, then the procedure report has been included in a sealed envelope for you to review at your convenience later.  YOU SHOULD EXPECT: Some feelings of bloating in the abdomen. Passage of more gas than usual.  Walking can help get rid of the air that was put into your GI tract during the procedure and reduce the bloating.   Please Note:  You might notice some irritation and congestion in your nose or some drainage.  This is from the oxygen used during your procedure.  There is no need for concern and it should clear up in a day or so.  SYMPTOMS TO REPORT IMMEDIATELY:    Following upper endoscopy (EGD)  Vomiting of blood or coffee ground material  New chest pain or pain under the shoulder blades  Painful or persistently difficult swallowing  New shortness of breath  Fever of 100F or higher  Black, tarry-looking stools  For urgent or emergent issues, a gastroenterologist can be reached at any hour by calling [phone]. Do not use MyChart messaging for urgent concerns.    DIET:  We do recommend a small meal at first, but then you may proceed to your regular diet.  Drink plenty of fluids but you should avoid alcoholic beverages for 24 hours.  Try to eat a reflux friendly diet.  It is in your discharge instructions.  ACTIVITY:  You should plan to take it easy for the rest of today and you should NOT DRIVE or use heavy machinery until tomorrow (because of the sedation medicines used during the test).    FOLLOW UP: Our staff will call the  number listed on your records 48-72 hours following your procedure to check on you and address any questions or concerns that you may have regarding the information given to you following your procedure. If we do not reach you, we will leave a message.  We will attempt to reach you two times.  During this call, we will ask if you have developed any symptoms of COVID 19. If you develop any symptoms (ie: fever, flu-like symptoms, shortness of breath, cough etc.) before then, please call [phone].  If you test positive for Covid 19 in the 2 weeks post procedure, please call and report this information to [place].    If any biopsies were taken you will be contacted by phone or by letter within the next 1-3 weeks.  Please call us at [phone] if you have not heard about the biopsies in 3 weeks.    SIGNATURES/CONFIDENTIALITY: You and/or your care partner have signed paperwork which will be entered into your electronic medical record.  These signatures attest to the fact that that the information above on your After Visit Summary has been reviewed and is understood.  Full responsibility of the confidentiality of this discharge information lies with you and/or your care-partner.

## 2020-08-21 NOTE — Progress Notes (Signed)
Called to room to assist during endoscopic procedure.  Patient ID and intended procedure confirmed with present staff. Received instructions for my participation in the procedure from the performing physician.  

## 2020-08-21 NOTE — Progress Notes (Signed)
Report to PACU, RN, vss, BBS= Clear.  

## 2020-08-23 ENCOUNTER — Telehealth: Payer: Self-pay | Admitting: *Deleted

## 2020-08-23 NOTE — Telephone Encounter (Signed)
  Follow up Call-  Call back number 08/21/2020  Post procedure Call Back phone  # [phone]  Permission to leave phone message Yes  Some recent data might be hidden     Patient questions:  Do you have a fever, pain , or abdominal swelling? No. Pain Score  0 *  Have you tolerated food without any problems? Yes.    Have you been able to return to your normal activities? Yes.    Do you have any questions about your discharge instructions: Diet   No. Medications  No. Follow up visit  No.  Do you have questions or concerns about your Care? No.  Actions: * If pain score is 4 or above: No action needed, pain <4.  1. Have you developed a fever since your procedure? no  2.   Have you had an respiratory symptoms (SOB or cough) since your procedure? no  3.   Have you tested positive for COVID 19 since your procedure no  4.   Have you had any family members/close contacts diagnosed with the COVID 19 since your procedure?  no   If yes to any of these questions please route to Joylene John, RN and Joella Prince, RN

## 2020-08-29 ENCOUNTER — Other Ambulatory Visit: Payer: Self-pay

## 2020-08-30 ENCOUNTER — Encounter: Payer: Self-pay | Admitting: Internal Medicine

## 2020-08-30 ENCOUNTER — Ambulatory Visit (INDEPENDENT_AMBULATORY_CARE_PROVIDER_SITE_OTHER): Payer: Medicare HMO | Admitting: Internal Medicine

## 2020-08-30 VITALS — BP 130/80 | HR 110 | Temp 98.8°F | Wt 169.9 lb

## 2020-08-30 DIAGNOSIS — M545 Low back pain, unspecified: Secondary | ICD-10-CM

## 2020-08-30 DIAGNOSIS — R319 Hematuria, unspecified: Secondary | ICD-10-CM | POA: Diagnosis not present

## 2020-08-30 DIAGNOSIS — R300 Dysuria: Secondary | ICD-10-CM

## 2020-08-30 LAB — URINALYSIS, ROUTINE W REFLEX MICROSCOPIC
Bilirubin Urine: NEGATIVE
Ketones, ur: NEGATIVE
Leukocytes,Ua: NEGATIVE
Nitrite: NEGATIVE
Specific Gravity, Urine: <=1.005 — AB (ref 1.000–1.030)
Total Protein, Urine: NEGATIVE
Urine Glucose: >=1000 — AB
Urobilinogen, UA: 0.2 (ref 0.0–1.0)
pH: 5.5 (ref 5.0–8.0)

## 2020-08-30 LAB — POCT URINALYSIS DIPSTICK
Bilirubin, UA: NEGATIVE
Blood, UA: POSITIVE
Glucose, UA: POSITIVE — AB
Ketones, UA: NEGATIVE
Leukocytes, UA: NEGATIVE
Nitrite, UA: NEGATIVE
Protein, UA: NEGATIVE
Spec Grav, UA: 1.015 (ref 1.010–1.025)
Urobilinogen, UA: 0.2 E.U./dL
pH, UA: 5.5 (ref 5.0–8.0)

## 2020-08-30 MED ORDER — SULFAMETHOXAZOLE-TRIMETHOPRIM 800-160 MG PO TABS: 1.0000 | ORAL_TABLET | Freq: Two times a day (BID) | ORAL | 0 refills | Status: AC

## 2020-08-30 NOTE — Progress Notes (Signed)
Acute Office Visit     This visit occurred during the SARS-CoV-2 public health emergency.  Safety protocols were in place, including screening questions prior to the visit, additional usage of staff PPE, and extensive cleaning of exam room while observing appropriate contact time as indicated for disinfecting solutions.    CC/Reason for Visit: Low back pain, brown color urine, dysuria  HPI: Jeff Jenkins is a 65 y.o. male who is coming in today for the above mentioned reasons.  Over the weekend he noticed some bilateral low back pain that has slowly improved.  Then he started having some burning and frequency of urination.  Day before yesterday urine was cloudy and yesterday it was brown with "flakes" inside of it.  He is pretty sure he has a urine infection.  He denies fever, abdominal pain.   Past Medical/Surgical History: Past Medical History:  Diagnosis Date  . Allergy   . Arthritis   . Broken ankle 2008  . Coronary artery disease   . Diverticulosis   . ED (erectile dysfunction)   . Facial fracture ([place]) 1979  . GERD (gastroesophageal reflux disease)   . Hearing loss in right ear   . Hemorrhoids   . Hiatal hernia   . Hyperlipidemia   . Hypertension   . Peptic stricture of esophagus   . Peptic ulcer disease 1989  . Prostate cancer ([place]) 02/2009  . Seasonal allergies   . Type 2 diabetes mellitus ([place])     Past Surgical History:  Procedure Laterality Date  . ANKLE SURGERY Left 2008  . CHOLECYSTECTOMY N/A 01/31/2020   Procedure: LAPAROSCOPIC CHOLECYSTECTOMY WITH INTRAOPERATIVE CHOLANGIOGRAM;  Surgeon: Clovis Riley, MD;  Location: [facility];  Service: General;  Laterality: N/A;  . FACIAL FRACTURE SURGERY    . INGUINAL HERNIA REPAIR Left   . KNEE ARTHROSCOPY Left 2013  . KNEE ARTHROSCOPY  [id number]   [facility]  . PROSTATECTOMY  2011  . TONSILLECTOMY    . UPPER GASTROINTESTINAL ENDOSCOPY    . VASECTOMY      Social History:  reports that he has never  smoked. He has never used smokeless tobacco. He reports current alcohol use of about 12.0 standard drinks of alcohol per week. He reports that he does not use drugs.  Allergies: No Known Allergies  Family History:  Family History  Problem Relation Age of Onset  . Breast cancer Mother   . Diabetes Mother   . Heart attack Mother   . Alcohol abuse Father   . Stroke Brother   . Colon cancer Neg Hx   . Esophageal cancer Neg Hx   . Rectal cancer Neg Hx   . Stomach cancer Neg Hx      Current Outpatient Medications:  .  acetaminophen (TYLENOL) 325 MG tablet, Take 650 mg by mouth every 6 (six) hours as needed for moderate pain., Disp: , Rfl:  .  aspirin EC 81 MG tablet, Take 81 mg by mouth daily. Swallow whole., Disp: , Rfl:  .  atorvastatin (LIPITOR) 40 MG tablet, TAKE 1 TABLET BY MOUTH EVERY DAY, Disp: 90 tablet, Rfl: 1 .  azelastine (ASTELIN) 0.1 % nasal spray, Place 1 spray into both nostrils 2 (two) times daily as needed for allergies. Use in each nostril as directed, Disp: , Rfl:  .  celecoxib (CELEBREX) 200 MG capsule, Take 200 mg by mouth daily., Disp: , Rfl:  .  diclofenac Sodium (VOLTAREN) 1 % GEL, Apply 2 g topically in the  morning, at noon, and at bedtime., Disp: , Rfl:  .  esomeprazole (NEXIUM) 40 MG capsule, TAKE 1 CAPSULE DAILY BEFORE BREAKFAST., Disp: 90 capsule, Rfl: 3 .  ezetimibe (ZETIA) 10 MG tablet, Take 10 mg by mouth daily., Disp: , Rfl:  .  ferrous sulfate 325 (65 FE) MG tablet, Take 325 mg by mouth 3 (three) times daily., Disp: , Rfl:  .  fexofenadine (ALLEGRA) 180 MG tablet, Take 180 mg by mouth daily., Disp: , Rfl:  .  HYDROcodone-acetaminophen (NORCO) 7.5-325 MG tablet, Take 1-2 tablets by mouth every 4 (four) hours as needed., Disp: , Rfl:  .  ipratropium (ATROVENT) 0.03 % nasal spray, INSTILL 2 SPRAYS IN EACH NOSTRIL TWICE A DAY, Disp: 30 mL, Rfl: 2 .  Lancets (ONETOUCH ULTRASOFT) lancets, 1 each by Other route daily. Dx E11.9, Disp: 100 each, Rfl: 3 .   lisinopril (ZESTRIL) 5 MG tablet, Take 5 mg by mouth daily., Disp: , Rfl:  .  methocarbamol (ROBAXIN) 500 MG tablet, Take 500 mg by mouth every 6 (six) hours as needed., Disp: , Rfl:  .  ONETOUCH ULTRA test strip, CHECK BLOOD SUGAR DAILY, Disp: 100 strip, Rfl: 3 .  sildenafil (VIAGRA) 100 MG tablet, TAKE 1 TABLET BY MOUTH DAILY AS NEEDED, Disp: 10 tablet, Rfl: 11 .  sulfamethoxazole-trimethoprim (BACTRIM DS) 800-160 MG tablet, Take 1 tablet by mouth 2 (two) times daily for 7 days., Disp: 14 tablet, Rfl: 0 .  SYNJARDY XR 25-1000 MG TB24, Take 1 tablet by mouth daily. , Disp: , Rfl:  .  tadalafil (CIALIS) 5 MG tablet, Take 5 mg by mouth daily as needed for erectile dysfunction. , Disp: , Rfl:  .  Testosterone 30 MG/ACT SOLN, Place onto the skin daily., Disp: , Rfl:   Review of Systems:  Constitutional: Denies fever, chills, diaphoresis, appetite change and fatigue.  HEENT: Denies photophobia, eye pain, redness, hearing loss, ear pain, congestion, sore throat, rhinorrhea, sneezing, mouth sores, trouble swallowing, neck pain, neck stiffness and tinnitus.   Respiratory: Denies SOB, DOE, cough, chest tightness,  and wheezing.   Cardiovascular: Denies chest pain, palpitations and leg swelling.  Gastrointestinal: Denies nausea, vomiting, abdominal pain, diarrhea, constipation, blood in stool and abdominal distention.  Genitourinary: Positive for dysuria, urgency, frequency, hematuria, flank pain and difficulty urinating.  Endocrine: Denies: hot or cold intolerance, sweats, changes in hair or nails, polyuria, polydipsia. Musculoskeletal: Denies myalgias, back pain, joint swelling, arthralgias and gait problem.  Skin: Denies pallor, rash and wound.  Neurological: Denies dizziness, seizures, syncope, weakness, light-headedness, numbness and headaches.  Hematological: Denies adenopathy. Easy bruising, personal or family bleeding history  Psychiatric/Behavioral: Denies suicidal ideation, mood changes,  confusion, nervousness, sleep disturbance and agitation    Physical Exam: Vitals:   08/30/20 1037  BP: 130/80  Pulse: (!) 110  Temp: 98.8 F (37.1 C)  TempSrc: Oral  SpO2: 96%  Weight: 169 lb 14.4 oz (77.1 kg)    Body mass index is 23.04 kg/m.   Constitutional: NAD, calm, comfortable Eyes: PERRL, lids and conjunctivae normal ENMT: Mucous membranes are moist.  Respiratory: clear to auscultation bilaterally, no wheezing, no crackles. Normal respiratory effort. No accessory muscle use.  Cardiovascular: Regular rate and rhythm, no murmurs / rubs / gallops. No extremity edema. .  Abdomen: no tenderness, no CVA tenderness, no masses palpated. No hepatosplenomegaly. Bowel sounds positive.  Neurologic: Grossly intact and nonfocal. Psychiatric: Normal judgment and insight. Alert and oriented x 3. Normal mood.    Impression and Plan:  Hematuria, unspecified  type  Dysuria  Low back pain, unspecified back pain laterality  -Urine dipstick in office today is positive for blood and glucose. -Start Bactrim DS for 7 days, sent for CT stone protocol. -He has an appointment with me in March, I will recheck urine then.  If persistent hematuria I will consider urology referral.    Lelon Frohlich, MD [facility] at [facility]

## 2020-08-31 ENCOUNTER — Encounter: Payer: Self-pay | Admitting: Internal Medicine

## 2020-09-01 ENCOUNTER — Encounter (HOSPITAL_COMMUNITY): Payer: Self-pay

## 2020-09-01 ENCOUNTER — Ambulatory Visit (HOSPITAL_COMMUNITY)
Admission: RE | Admit: 2020-09-01 | Discharge: 2020-09-01 | Disposition: A | Discharge status: Home / Self Care | Payer: Medicare HMO | Source: Ambulatory Visit | Attending: Internal Medicine | Admitting: Internal Medicine

## 2020-09-01 ENCOUNTER — Other Ambulatory Visit: Payer: Self-pay

## 2020-09-01 DIAGNOSIS — M160 Bilateral primary osteoarthritis of hip: Secondary | ICD-10-CM | POA: Diagnosis not present

## 2020-09-01 DIAGNOSIS — K7689 Other specified diseases of liver: Secondary | ICD-10-CM | POA: Diagnosis not present

## 2020-09-01 DIAGNOSIS — R319 Hematuria, unspecified: Secondary | ICD-10-CM | POA: Insufficient documentation

## 2020-09-01 DIAGNOSIS — N281 Cyst of kidney, acquired: Secondary | ICD-10-CM | POA: Diagnosis not present

## 2020-09-01 LAB — URINE CULTURE
MICRO NUMBER:: 11541601
SPECIMEN QUALITY:: ADEQUATE

## 2020-09-05 ENCOUNTER — Other Ambulatory Visit: Payer: Self-pay | Admitting: Internal Medicine

## 2020-09-05 DIAGNOSIS — R319 Hematuria, unspecified: Secondary | ICD-10-CM

## 2020-09-06 ENCOUNTER — Encounter: Payer: Self-pay | Admitting: Gastroenterology

## 2020-09-08 ENCOUNTER — Ambulatory Visit (HOSPITAL_COMMUNITY): Admission: RE | Admit: 2020-09-08 | Payer: Medicare HMO | Source: Ambulatory Visit

## 2020-09-11 ENCOUNTER — Other Ambulatory Visit: Payer: Self-pay

## 2020-09-11 MED ORDER — ESOMEPRAZOLE MAGNESIUM 40 MG PO CPDR: DELAYED_RELEASE_CAPSULE | ORAL | 3 refills | Status: DC

## 2020-09-20 ENCOUNTER — Ambulatory Visit (INDEPENDENT_AMBULATORY_CARE_PROVIDER_SITE_OTHER): Payer: Medicare HMO | Admitting: Internal Medicine

## 2020-09-20 ENCOUNTER — Encounter: Payer: Self-pay | Admitting: Internal Medicine

## 2020-09-20 ENCOUNTER — Other Ambulatory Visit: Payer: Self-pay

## 2020-09-20 VITALS — BP 140/80 | HR 85 | Temp 98.3°F | Wt 169.5 lb

## 2020-09-20 DIAGNOSIS — R030 Elevated blood-pressure reading, without diagnosis of hypertension: Secondary | ICD-10-CM

## 2020-09-20 DIAGNOSIS — D509 Iron deficiency anemia, unspecified: Secondary | ICD-10-CM | POA: Diagnosis not present

## 2020-09-20 DIAGNOSIS — E7800 Pure hypercholesterolemia, unspecified: Secondary | ICD-10-CM

## 2020-09-20 DIAGNOSIS — E119 Type 2 diabetes mellitus without complications: Secondary | ICD-10-CM

## 2020-09-20 DIAGNOSIS — H9193 Unspecified hearing loss, bilateral: Secondary | ICD-10-CM

## 2020-09-20 DIAGNOSIS — R319 Hematuria, unspecified: Secondary | ICD-10-CM

## 2020-09-20 DIAGNOSIS — I2583 Coronary atherosclerosis due to lipid rich plaque: Secondary | ICD-10-CM | POA: Diagnosis not present

## 2020-09-20 DIAGNOSIS — I2510 Atherosclerotic heart disease of native coronary artery without angina pectoris: Secondary | ICD-10-CM

## 2020-09-20 DIAGNOSIS — N529 Male erectile dysfunction, unspecified: Secondary | ICD-10-CM

## 2020-09-20 DIAGNOSIS — C61 Malignant neoplasm of prostate: Secondary | ICD-10-CM | POA: Insufficient documentation

## 2020-09-20 LAB — POCT URINALYSIS DIPSTICK
Bilirubin, UA: NEGATIVE
Bilirubin, UA: NEGATIVE
Blood, UA: NEGATIVE
Blood, UA: NEGATIVE
Glucose, UA: POSITIVE — AB
Glucose, UA: POSITIVE — AB
Ketones, UA: NEGATIVE
Ketones, UA: NEGATIVE
Leukocytes, UA: NEGATIVE
Leukocytes, UA: NEGATIVE
Nitrite, UA: NEGATIVE
Nitrite, UA: NEGATIVE
Odor: NEGATIVE
Protein, UA: NEGATIVE
Protein, UA: NEGATIVE
Spec Grav, UA: 1.02 (ref 1.010–1.025)
Spec Grav, UA: 1.02 (ref 1.010–1.025)
Urobilinogen, UA: 0.2 E.U./dL
Urobilinogen, UA: 0.2 E.U./dL
pH, UA: 6 (ref 5.0–8.0)
pH, UA: 5.5 (ref 5.0–8.0)

## 2020-09-20 LAB — POCT GLYCOSYLATED HEMOGLOBIN (HGB A1C): Hemoglobin A1C: 6.1 % — AB (ref 4.0–5.6)

## 2020-09-20 LAB — URINALYSIS
Bilirubin Urine: NEGATIVE
Hgb urine dipstick: NEGATIVE
Ketones, ur: NEGATIVE
Leukocytes,Ua: NEGATIVE
Nitrite: NEGATIVE
Specific Gravity, Urine: 1.02 (ref 1.000–1.030)
Total Protein, Urine: NEGATIVE
Urine Glucose: >=1000 — AB
Urobilinogen, UA: 0.2 (ref 0.0–1.0)
pH: 6 (ref 5.0–8.0)

## 2020-09-20 MED ORDER — SYNJARDY XR 25-1000 MG PO TB24
1.0000 | ORAL_TABLET | Freq: Every day | ORAL | 5 refills | Status: DC
Start: 2020-09-20 — End: 2020-09-20

## 2020-09-20 MED ORDER — EZETIMIBE 10 MG PO TABS: 10.0000 mg | ORAL_TABLET | Freq: Every day | ORAL | 1 refills | Status: DC

## 2020-09-20 MED ORDER — LISINOPRIL 5 MG PO TABS: 5.0000 mg | ORAL_TABLET | Freq: Every day | ORAL | 1 refills | Status: DC

## 2020-09-20 MED ORDER — ATORVASTATIN CALCIUM 40 MG PO TABS: 40.0000 mg | ORAL_TABLET | Freq: Every day | ORAL | 1 refills | Status: DC

## 2020-09-20 MED ORDER — SYNJARDY XR 25-1000 MG PO TB24: 1.0000 | ORAL_TABLET | Freq: Every day | ORAL | 5 refills | Status: DC

## 2020-09-20 NOTE — Addendum Note (Signed)
Addended by: Elmer Picker on: 09/20/2020 10:37 AM   Modules accepted: Orders

## 2020-09-20 NOTE — Addendum Note (Signed)
Addended by: Elmer Picker on: 09/20/2020 10:33 AM   Modules accepted: Orders

## 2020-09-20 NOTE — Patient Instructions (Signed)
-  Nice seeing you today!!  -Lab work today; will notify you once results are available.  -Schedule follow up in 3 months. 

## 2020-09-20 NOTE — Progress Notes (Signed)
Established Patient Office Visit     This visit occurred during the SARS-CoV-2 public health emergency.  Safety protocols were in place, including screening questions prior to the visit, additional usage of staff PPE, and extensive cleaning of exam room while observing appropriate contact time as indicated for disinfecting solutions.    CC/Reason for Visit: Follow-up chronic medical conditions  HPI: Jeff Jenkins is a 65 y.o. male who is coming in today for the above mentioned reasons. Past Medical History is significant for: type 2 diabetes, hyperlipidemia, elevated blood pressures but not diagnosed with hypertension, GERD, coronary artery disease.  He had cholecystectomy during the summer 2021 after having found to have cholelithiasis with biliary colic.  He also has a history of prostate cancer followed by urology.  He has a history of iron deficiency anemia and is interested in following his iron levels today.  His weight has been stable to increased.  He was seen recently for hematuria, he was found to have an E. coli UTI and treated with Bactrim.  He has noted progressive hearing difficulty and would like to be referred to an audiologist today.  He is known to have whitecoat syndrome, he checked his blood pressure last night at home and it was 106/68.   Past Medical/Surgical History: Past Medical History:  Diagnosis Date  . Allergy   . Arthritis   . Broken ankle 2008  . Coronary artery disease   . Diverticulosis   . ED (erectile dysfunction)   . Facial fracture ([place]) 1979  . GERD (gastroesophageal reflux disease)   . Hearing loss in right ear   . Hemorrhoids   . Hiatal hernia   . Hyperlipidemia   . Hypertension   . Peptic stricture of esophagus   . Peptic ulcer disease 1989  . Prostate cancer ([place]) 02/2009  . Seasonal allergies   . Type 2 diabetes mellitus ([place])     Past Surgical History:  Procedure Laterality Date  . ANKLE SURGERY Left 2008  .  CHOLECYSTECTOMY N/A 01/31/2020   Procedure: LAPAROSCOPIC CHOLECYSTECTOMY WITH INTRAOPERATIVE CHOLANGIOGRAM;  Surgeon: Clovis Riley, MD;  Location: [facility];  Service: General;  Laterality: N/A;  . FACIAL FRACTURE SURGERY    . INGUINAL HERNIA REPAIR Left   . KNEE ARTHROSCOPY Left 2013  . KNEE ARTHROSCOPY  [id number]   [facility]  . PROSTATECTOMY  2011  . TONSILLECTOMY    . UPPER GASTROINTESTINAL ENDOSCOPY    . VASECTOMY      Social History:  reports that he has never smoked. He has never used smokeless tobacco. He reports current alcohol use of about 12.0 standard drinks of alcohol per week. He reports that he does not use drugs.  Allergies: No Known Allergies  Family History:  Family History  Problem Relation Age of Onset  . Breast cancer Mother   . Diabetes Mother   . Heart attack Mother   . Alcohol abuse Father   . Stroke Brother   . Colon cancer Neg Hx   . Esophageal cancer Neg Hx   . Rectal cancer Neg Hx   . Stomach cancer Neg Hx      Current Outpatient Medications:  .  acetaminophen (TYLENOL) 325 MG tablet, Take 650 mg by mouth every 6 (six) hours as needed for moderate pain., Disp: , Rfl:  .  aspirin EC 81 MG tablet, Take 81 mg by mouth daily. Swallow whole., Disp: , Rfl:  .  azelastine (ASTELIN) 0.1 % nasal spray,  Place 1 spray into both nostrils 2 (two) times daily as needed for allergies. Use in each nostril as directed, Disp: , Rfl:  .  celecoxib (CELEBREX) 200 MG capsule, Take 200 mg by mouth daily., Disp: , Rfl:  .  diclofenac Sodium (VOLTAREN) 1 % GEL, Apply 2 g topically in the morning, at noon, and at bedtime., Disp: , Rfl:  .  esomeprazole (NEXIUM) 40 MG capsule, TAKE 1 CAPSULE DAILY BEFORE BREAKFAST., Disp: 90 capsule, Rfl: 3 .  ferrous sulfate 325 (65 FE) MG tablet, Take 325 mg by mouth 3 (three) times daily., Disp: , Rfl:  .  fexofenadine (ALLEGRA) 180 MG tablet, Take 180 mg by mouth daily., Disp: , Rfl:  .  HYDROcodone-acetaminophen (NORCO) 7.5-325 MG  tablet, Take 1-2 tablets by mouth every 4 (four) hours as needed., Disp: , Rfl:  .  ipratropium (ATROVENT) 0.03 % nasal spray, INSTILL 2 SPRAYS IN EACH NOSTRIL TWICE A DAY, Disp: 30 mL, Rfl: 2 .  Lancets (ONETOUCH ULTRASOFT) lancets, 1 each by Other route daily. Dx E11.9, Disp: 100 each, Rfl: 3 .  methocarbamol (ROBAXIN) 500 MG tablet, Take 500 mg by mouth every 6 (six) hours as needed., Disp: , Rfl:  .  ONETOUCH ULTRA test strip, CHECK BLOOD SUGAR DAILY, Disp: 100 strip, Rfl: 3 .  sildenafil (VIAGRA) 100 MG tablet, TAKE 1 TABLET BY MOUTH DAILY AS NEEDED, Disp: 10 tablet, Rfl: 11 .  tadalafil (CIALIS) 5 MG tablet, Take 5 mg by mouth daily as needed for erectile dysfunction. , Disp: , Rfl:  .  Testosterone 30 MG/ACT SOLN, Place onto the skin daily., Disp: , Rfl:  .  atorvastatin (LIPITOR) 40 MG tablet, Take 1 tablet (40 mg total) by mouth daily., Disp: 90 tablet, Rfl: 1 .  ezetimibe (ZETIA) 10 MG tablet, Take 1 tablet (10 mg total) by mouth daily., Disp: 90 tablet, Rfl: 1 .  lisinopril (ZESTRIL) 5 MG tablet, Take 1 tablet (5 mg total) by mouth daily., Disp: 90 tablet, Rfl: 1 .  SYNJARDY XR 25-1000 MG TB24, Take 1 tablet by mouth daily., Disp: 30 tablet, Rfl: 5  Review of Systems:  Constitutional: Denies fever, chills, diaphoresis, appetite change and fatigue.  HEENT: Denies photophobia, eye pain, redness, hearing loss, ear pain, congestion, sore throat, rhinorrhea, sneezing, mouth sores, trouble swallowing, neck pain, neck stiffness and tinnitus.   Respiratory: Denies SOB, DOE, cough, chest tightness,  and wheezing.   Cardiovascular: Denies chest pain, palpitations and leg swelling.  Gastrointestinal: Denies nausea, vomiting, abdominal pain, diarrhea, constipation, blood in stool and abdominal distention.  Genitourinary: Denies dysuria, urgency, frequency, hematuria, flank pain and difficulty urinating.  Endocrine: Denies: hot or cold intolerance, sweats, changes in hair or nails, polyuria,  polydipsia. Musculoskeletal: Denies myalgias, back pain, joint swelling, arthralgias and gait problem.  Skin: Denies pallor, rash and wound.  Neurological: Denies dizziness, seizures, syncope, weakness, light-headedness, numbness and headaches.  Hematological: Denies adenopathy. Easy bruising, personal or family bleeding history  Psychiatric/Behavioral: Denies suicidal ideation, mood changes, confusion, nervousness, sleep disturbance and agitation    Physical Exam: Vitals:   09/20/20 0932  BP: 140/80  Pulse: 85  Temp: 98.3 F (36.8 C)  TempSrc: Oral  SpO2: 96%  Weight: 169 lb 8 oz (76.9 kg)    Body mass index is 22.99 kg/m.   Constitutional: NAD, calm, comfortable Eyes: PERRL, lids and conjunctivae normal ENMT: Mucous membranes are moist.  Respiratory: clear to auscultation bilaterally, no wheezing, no crackles. Normal respiratory effort. No accessory muscle use.  Cardiovascular: Regular rate and rhythm, no murmurs / rubs / gallops. No extremity edema.  Neurologic: Grossly intact and nonfocal Psychiatric: Normal judgment and insight. Alert and oriented x 3. Normal mood.    Impression and Plan:  Type 2 diabetes mellitus without obesity ([place])  -Well-controlled with an A1c of 6.1 today, continue Synjardy.  Pure hypercholesterolemia  - Plan: ezetimibe (ZETIA) 10 MG tablet, atorvastatin (LIPITOR) 40 MG tablet -Last measured LDL was well within goal at 53 in December 2021.  Elevated blood pressure reading in office with white coat syndrome, without diagnosis of hypertension  -Blood pressure is again noted to be elevated in office today to 140/80, home measurements are well within range, last night was 106/68.  Coronary artery disease due to lipid rich plaque -Followed by cardiology.  Erectile dysfunction, unspecified erectile dysfunction type -On as needed sildenafil.  Bilateral hearing loss, unspecified hearing loss type -Refer to audiology.  Hematuria, unspecified  type Prostate cancer [facility]), Chronic -His urologist is Dr. Jeffie Pollock. -Check UA, if persistent hematuria I will refer back to his office.  Iron deficiency anemia, unspecified iron deficiency anemia type  - Plan: Anemia panel   Patient Instructions  -Nice seeing you today!!  -Lab work today; will notify you once results are available.  -Schedule follow up in 3 months.     Lelon Frohlich, MD [facility]

## 2020-09-20 NOTE — Addendum Note (Signed)
Addended by: Elmer Picker on: 09/20/2020 10:21 AM   Modules accepted: Orders

## 2020-09-20 NOTE — Addendum Note (Signed)
Addended by: Westley Hummer B on: 09/20/2020 10:34 AM   Modules accepted: Orders

## 2020-09-21 ENCOUNTER — Telehealth: Payer: Self-pay | Admitting: *Deleted

## 2020-09-21 LAB — ANEMIA PROFILE B
Basophils Absolute: 0 10*3/uL (ref 0.0–0.2)
Basos: 1 %
EOS (ABSOLUTE): 0.1 10*3/uL (ref 0.0–0.4)
Eos: 2 %
Ferritin: 300 ng/mL (ref 30–400)
Folate: 15 ng/mL (ref >3.0)
Hematocrit: 45.1 % (ref 37.5–51.0)
Hemoglobin: 14.8 g/dL (ref 13.0–17.7)
Immature Grans (Abs): 0 10*3/uL (ref 0.0–0.1)
Immature Granulocytes: 1 %
Iron Saturation: 29 % (ref 15–55)
Iron: 77 ug/dL (ref 38–169)
Lymphocytes Absolute: 1.1 10*3/uL (ref 0.7–3.1)
Lymphs: 22 %
MCH: 30.8 pg (ref 26.6–33.0)
MCHC: 32.8 g/dL (ref 31.5–35.7)
MCV: 94 fL (ref 79–97)
Monocytes Absolute: 0.4 10*3/uL (ref 0.1–0.9)
Monocytes: 8 %
Neutrophils Absolute: 3.5 10*3/uL (ref 1.4–7.0)
Neutrophils: 66 %
Platelets: 180 10*3/uL (ref 150–450)
RBC: 4.8 x10E6/uL (ref 4.14–5.80)
RDW: 12.3 % (ref 11.6–15.4)
Retic Ct Pct: 1.7 % (ref 0.6–2.6)
Total Iron Binding Capacity: 268 ug/dL (ref 250–450)
UIBC: 191 ug/dL (ref 111–343)
Vitamin B-12: 216 pg/mL — ABNORMAL LOW (ref 232–1245)
WBC: 5.2 10*3/uL (ref 3.4–10.8)

## 2020-09-21 NOTE — Chronic Care Management (AMB) (Signed)
  Chronic Care Management   Note  09/21/2020 Name: Jeff Jenkins MRN: [id number] DOB: 10-07-1955  Jeff Jenkins is a 65 y.o. year old male who is a primary care patient of Isaac Bliss, Rayford Halsted, MD. I reached out to Wilmon Pali by phone today in response to a referral sent by [name]'s PCP, Isaac Bliss, Rayford Halsted, MD     [name] was given information about Chronic Care Management services today including:  1. CCM service includes personalized support from designated clinical staff supervised by his physician, including individualized plan of care and coordination with other care providers 2. 24/7 contact phone numbers for assistance for urgent and routine care needs. 3. Service will only be billed when office clinical staff spend 20 minutes or more in a month to coordinate care. 4. Only one practitioner may furnish and bill the service in a calendar month. 5. The patient may stop CCM services at any time (effective at the end of the month) by phone call to the office staff. 6. The patient will be responsible for cost sharing (co-pay) of up to 20% of the service fee (after annual deductible is met).  Patient agreed to services and verbal consent obtained.   Follow up plan: Telephone appointment with care management team member scheduled for: 09/25/2020  [facility]

## 2020-09-22 ENCOUNTER — Telehealth: Payer: Self-pay | Admitting: Internal Medicine

## 2020-09-22 ENCOUNTER — Encounter: Payer: Self-pay | Admitting: Internal Medicine

## 2020-09-22 NOTE — Telephone Encounter (Signed)
Patient called wanting to know if Dr. Jerilee Hoh can call him in something to the pharmacy for a UTI.   He just seen Dr. Jerilee Hoh for a UTI 08/30/2020   [id number] - [place], [place] - [address] Phone:  [phone]  Fax:  [phone]

## 2020-09-22 NOTE — Telephone Encounter (Signed)
Pt call and stated he want dr.Hernandez to call him a antibiotics because he have blood in him urine again.pt stated want a call back.

## 2020-09-25 ENCOUNTER — Telehealth: Payer: Medicare HMO

## 2020-09-26 ENCOUNTER — Other Ambulatory Visit: Payer: Self-pay | Admitting: Internal Medicine

## 2020-09-26 ENCOUNTER — Other Ambulatory Visit (INDEPENDENT_AMBULATORY_CARE_PROVIDER_SITE_OTHER): Payer: Medicare HMO

## 2020-09-26 ENCOUNTER — Other Ambulatory Visit: Payer: Self-pay

## 2020-09-26 DIAGNOSIS — R300 Dysuria: Secondary | ICD-10-CM

## 2020-09-26 LAB — POCT URINALYSIS DIPSTICK
Bilirubin, UA: NEGATIVE
Blood, UA: NEGATIVE
Glucose, UA: POSITIVE — AB
Ketones, UA: NEGATIVE
Leukocytes, UA: NEGATIVE
Nitrite, UA: NEGATIVE
Protein, UA: NEGATIVE
Spec Grav, UA: 1.015 (ref 1.010–1.025)
Urobilinogen, UA: 0.2 E.U./dL
pH, UA: 5 (ref 5.0–8.0)

## 2020-09-26 LAB — URINALYSIS
Bilirubin Urine: NEGATIVE
Hgb urine dipstick: NEGATIVE
Ketones, ur: NEGATIVE
Leukocytes,Ua: NEGATIVE
Nitrite: NEGATIVE
Specific Gravity, Urine: <=1.005 — AB (ref 1.000–1.030)
Total Protein, Urine: NEGATIVE
Urine Glucose: >=1000 — AB
Urobilinogen, UA: 0.2 (ref 0.0–1.0)
pH: 5.5 (ref 5.0–8.0)

## 2020-09-26 NOTE — Telephone Encounter (Signed)
Please have him bring a urine sample today for analysis.

## 2020-09-26 NOTE — Addendum Note (Signed)
Addended by: Tessie Fass D on: 09/26/2020 11:49 AM   Modules accepted: Orders

## 2020-09-26 NOTE — Telephone Encounter (Signed)
Spoke with patient. Lab appointment and order placed. 

## 2020-09-26 NOTE — Telephone Encounter (Signed)
Spoke with patient and urine sample has been dropped off.

## 2020-09-27 ENCOUNTER — Ambulatory Visit (INDEPENDENT_AMBULATORY_CARE_PROVIDER_SITE_OTHER): Payer: Medicare HMO | Admitting: *Deleted

## 2020-09-27 ENCOUNTER — Other Ambulatory Visit: Payer: Self-pay

## 2020-09-27 DIAGNOSIS — E538 Deficiency of other specified B group vitamins: Secondary | ICD-10-CM

## 2020-09-27 MED ORDER — CYANOCOBALAMIN 1000 MCG/ML IJ SOLN
1000.0000 ug | Freq: Once | INTRAMUSCULAR | Status: AC
  Administered 2020-09-27: 1000 ug via INTRAMUSCULAR

## 2020-09-27 NOTE — Progress Notes (Signed)
Per orders of Dr. Hernandez, injection of B12 given by Rachel Vereen. Patient tolerated injection well.  

## 2020-09-28 ENCOUNTER — Other Ambulatory Visit: Payer: Self-pay | Admitting: Internal Medicine

## 2020-09-28 ENCOUNTER — Encounter: Admit: 2020-09-28 | Discharge: 2020-09-28 | Payer: MEDICARE

## 2020-09-28 DIAGNOSIS — N390 Urinary tract infection, site not specified: Secondary | ICD-10-CM

## 2020-09-28 DIAGNOSIS — B9620 Unspecified Escherichia coli [E. coli] as the cause of diseases classified elsewhere: Secondary | ICD-10-CM

## 2020-09-28 LAB — URINE CULTURE
MICRO NUMBER:: 11648940
SPECIMEN QUALITY:: ADEQUATE

## 2020-09-28 MED ORDER — CIPROFLOXACIN HCL 500 MG PO TABS: 500.0000 mg | ORAL_TABLET | Freq: Two times a day (BID) | ORAL | 0 refills | Status: AC

## 2020-10-03 ENCOUNTER — Ambulatory Visit (INDEPENDENT_AMBULATORY_CARE_PROVIDER_SITE_OTHER): Payer: Medicare HMO

## 2020-10-03 ENCOUNTER — Other Ambulatory Visit: Payer: Self-pay

## 2020-10-03 ENCOUNTER — Ambulatory Visit (INDEPENDENT_AMBULATORY_CARE_PROVIDER_SITE_OTHER): Payer: Medicare HMO | Admitting: *Deleted

## 2020-10-03 DIAGNOSIS — E538 Deficiency of other specified B group vitamins: Secondary | ICD-10-CM | POA: Diagnosis not present

## 2020-10-03 DIAGNOSIS — E119 Type 2 diabetes mellitus without complications: Secondary | ICD-10-CM

## 2020-10-03 DIAGNOSIS — R030 Elevated blood-pressure reading, without diagnosis of hypertension: Secondary | ICD-10-CM

## 2020-10-03 MED ORDER — CYANOCOBALAMIN 1000 MCG/ML IJ SOLN
1000.0000 ug | Freq: Once | INTRAMUSCULAR | Status: AC
  Administered 2020-10-03: 1000 ug via INTRAMUSCULAR

## 2020-10-03 NOTE — Progress Notes (Signed)
Per orders of Dr. Hernandez, injection of Cyanocobalamin 1000mcg given by Funderburk, Jo A. Patient tolerated injection well. 

## 2020-10-03 NOTE — Patient Instructions (Signed)
Visit Information  PATIENT GOALS:  Goals Addressed            This Visit's Progress   . Monitor and Manage My Blood Sugar-Diabetes Type 2       Timeframe:  Long-Range Goal Priority:  High Start Date:       10/03/20                      Expected End Date:           01/12/21            Follow Up Date 11/14/2020 at 10 AM    - check blood sugar at prescribed times - check blood sugar if I feel it is too high or too low - enter blood sugar readings and medication or insulin into daily log - take the blood sugar log to all doctor visits  -drink half and half sweet tea 4 times a week instead of sweet tea   Why is this important?    Checking your blood sugar at home helps to keep it from getting very high or very low.   Writing the results in a diary or log helps the doctor know how to care for you.   Your blood sugar log should have the time, date and the results.   Also, write down the amount of insulin or other medicine that you take.   Other information, like what you ate, exercise done and how you were feeling, will also be helpful.     Notes:     . Track and Manage My Blood Pressure-Hypertension       Timeframe:  Long-Range Goal Priority:  Medium Start Date:      10/03/20                       Expected End Date:      01/12/21                 Follow Up Date 11/14/2020    - check blood pressure daily - choose a place to take my blood pressure (home, clinic or office, retail store) - write blood pressure results in a log or diary    Why is this important?    You won't feel high blood pressure, but it can still hurt your blood vessels.   High blood pressure can cause heart or kidney problems. It can also cause a stroke.   Making lifestyle changes like losing a little weight or eating less salt will help.   Checking your blood pressure at home and at different times of the day can help to control blood pressure.   If the doctor prescribes medicine remember to take it the  way the doctor ordered.   Call the office if you cannot afford the medicine or if there are questions about it.     Notes:        Diabetes Mellitus and Nutrition, Adult When you have diabetes, or diabetes mellitus, it is very important to have healthy eating habits because your blood sugar (glucose) levels are greatly affected by what you eat and drink. Eating healthy foods in the right amounts, at about the same times every day, can help you:  Control your blood glucose.  Lower your risk of heart disease.  Improve your blood pressure.  Reach or maintain a healthy weight. What can affect my meal plan? Every person with diabetes is different, and each person  has different needs for a meal plan. Your health care provider may recommend that you work with a dietitian to make a meal plan that is best for you. Your meal plan may vary depending on factors such as:  The calories you need.  The medicines you take.  Your weight.  Your blood glucose, blood pressure, and cholesterol levels.  Your activity level.  Other health conditions you have, such as heart or kidney disease. How do carbohydrates affect me? Carbohydrates, also called carbs, affect your blood glucose level more than any other type of food. Eating carbs naturally raises the amount of glucose in your blood. Carb counting is a method for keeping track of how many carbs you eat. Counting carbs is important to keep your blood glucose at a healthy level, especially if you use insulin or take certain oral diabetes medicines. It is important to know how many carbs you can safely have in each meal. This is different for every person. Your dietitian can help you calculate how many carbs you should have at each meal and for each snack. How does alcohol affect me? Alcohol can cause a sudden decrease in blood glucose (hypoglycemia), especially if you use insulin or take certain oral diabetes medicines. Hypoglycemia can be a  life-threatening condition. Symptoms of hypoglycemia, such as sleepiness, dizziness, and confusion, are similar to symptoms of having too much alcohol.  Do not drink alcohol if: ? Your health care provider tells you not to drink. ? You are pregnant, may be pregnant, or are planning to become pregnant.  If you drink alcohol: ? Do not drink on an empty stomach. ? Limit how much you use to:  0-1 drink a day for women.  0-2 drinks a day for men. ? Be aware of how much alcohol is in your drink. In the [place] one drink equals one 12 oz bottle of beer (355 mL), one 5 oz glass of wine (148 mL), or one 1 oz glass of hard liquor (44 mL). ? Keep yourself hydrated with water, diet soda, or unsweetened iced tea.  Keep in mind that regular soda, juice, and other mixers may contain a lot of sugar and must be counted as carbs. What are tips for following this plan? Reading food labels  Start by checking the serving size on the "Nutrition Facts" label of packaged foods and drinks. The amount of calories, carbs, fats, and other nutrients listed on the label is based on one serving of the item. Many items contain more than one serving per package.  Check the total grams (g) of carbs in one serving. You can calculate the number of servings of carbs in one serving by dividing the total carbs by 15. For example, if a food has 30 g of total carbs per serving, it would be equal to 2 servings of carbs.  Check the number of grams (g) of saturated fats and trans fats in one serving. Choose foods that have a low amount or none of these fats.  Check the number of milligrams (mg) of salt (sodium) in one serving. Most people should limit total sodium intake to less than 2,300 mg per day.  Always check the nutrition information of foods labeled as "low-fat" or "nonfat." These foods may be higher in added sugar or refined carbs and should be avoided.  Talk to your dietitian to identify your daily goals for nutrients  listed on the label. Shopping  Avoid buying canned, pre-made, or processed foods. These foods tend  to be high in fat, sodium, and added sugar.  Shop around the outside edge of the grocery store. This is where you will most often find fresh fruits and vegetables, bulk grains, fresh meats, and fresh dairy. Cooking  Use low-heat cooking methods, such as baking, instead of high-heat cooking methods like deep frying.  Cook using healthy oils, such as olive, canola, or sunflower oil.  Avoid cooking with butter, cream, or high-fat meats. Meal planning  Eat meals and snacks regularly, preferably at the same times every day. Avoid going long periods of time without eating.  Eat foods that are high in fiber, such as fresh fruits, vegetables, beans, and whole grains. Talk with your dietitian about how many servings of carbs you can eat at each meal.  Eat 4-6 oz (112-168 g) of lean protein each day, such as lean meat, chicken, fish, eggs, or tofu. One ounce (oz) of lean protein is equal to: ? 1 oz (28 g) of meat, chicken, or fish. ? 1 egg. ?  cup (62 g) of tofu.  Eat some foods each day that contain healthy fats, such as avocado, nuts, seeds, and fish.   What foods should I eat? Fruits Berries. Apples. Oranges. Peaches. Apricots. Plums. Grapes. Mango. Papaya. Pomegranate. Kiwi. Cherries. Vegetables Lettuce. Spinach. Leafy greens, including kale, chard, collard greens, and mustard greens. Beets. Cauliflower. Cabbage. Broccoli. Carrots. Green beans. Tomatoes. Peppers. Onions. Cucumbers. Brussels sprouts. Grains Whole grains, such as whole-wheat or whole-grain bread, crackers, tortillas, cereal, and pasta. Unsweetened oatmeal. Quinoa. Brown or wild rice. Meats and other proteins Seafood. Poultry without skin. Lean cuts of poultry and beef. Tofu. Nuts. Seeds. Dairy Low-fat or fat-free dairy products such as milk, yogurt, and cheese. The items listed above may not be a complete list of foods and  beverages you can eat. Contact a dietitian for more information. What foods should I avoid? Fruits Fruits canned with syrup. Vegetables Canned vegetables. Frozen vegetables with butter or cream sauce. Grains Refined white flour and flour products such as bread, pasta, snack foods, and cereals. Avoid all processed foods. Meats and other proteins Fatty cuts of meat. Poultry with skin. Breaded or fried meats. Processed meat. Avoid saturated fats. Dairy Full-fat yogurt, cheese, or milk. Beverages Sweetened drinks, such as soda or iced tea. The items listed above may not be a complete list of foods and beverages you should avoid. Contact a dietitian for more information. Questions to ask a health care provider  Do I need to meet with a diabetes educator?  Do I need to meet with a dietitian?  What number can I call if I have questions?  When are the best times to check my blood glucose? Where to find more information:  American Diabetes Association: diabetes.org  Academy of Nutrition and Dietetics: www.eatright.[employer] of Diabetes and Digestive and Kidney Diseases: [employer]  Association of Diabetes Care and Education Specialists: www.diabeteseducator.org Summary  It is important to have healthy eating habits because your blood sugar (glucose) levels are greatly affected by what you eat and drink.  A healthy meal plan will help you control your blood glucose and maintain a healthy lifestyle.  Your health care provider may recommend that you work with a dietitian to make a meal plan that is best for you.  Keep in mind that carbohydrates (carbs) and alcohol have immediate effects on your blood glucose levels. It is important to count carbs and to use alcohol carefully. This information is not intended to replace advice  given to you by your health care provider. Make sure you discuss any questions you have with your health care provider. Document Revised:  06/08/2019 Document Reviewed: 06/08/2019 Elsevier Patient Education  2021 [place].  Consent to CCM Services: Mr. Vitug was given information about Chronic Care Management services today including:  1. CCM service includes personalized support from designated clinical staff supervised by his physician, including individualized plan of care and coordination with other care providers 2. 24/7 contact phone numbers for assistance for urgent and routine care needs. 3. Service will only be billed when office clinical staff spend 20 minutes or more in a month to coordinate care. 4. Only one practitioner may furnish and bill the service in a calendar month. 5. The patient may stop CCM services at any time (effective at the end of the month) by phone call to the office staff. 6. The patient will be responsible for cost sharing (co-pay) of up to 20% of the service fee (after annual deductible is met).  Patient agreed to services and verbal consent obtained.   Patient verbalizes understanding of instructions provided today and agrees to view in [place].   Telephone follow up appointment with care management team member scheduled for:11/14/20 at 2 AM  [place], [facility], CDE Care Management Coordinator [facility] [phone], Mobile [phone]  CLINICAL CARE PLAN: Patient Care Plan: RNCM:Diabetes Type 2 (Adult)    Problem Identified: Glycemic Management (Diabetes, Type 2)   Priority: High    Long-Range Goal: Glycemic Management Optimized   Start Date: 10/03/2020  Expected End Date: 01/12/2021  This Visit's Progress: On track  Priority: High  Note:   Objective:  Lab Results  Component Value Date   HGBA1C 6.1 (A) 09/20/2020 .   Lab Results  Component Value Date   CREATININE 0.74 06/22/2020   CREATININE 0.74 01/27/2020   CREATININE 0.78 04/28/2019 .   Marland Kitchen No results found for: EGFR Current Barriers:  Marland Kitchen Knowledge Deficits related to basic Diabetes  pathophysiology and self care/management . Knowledge Deficits related to medications used for management of diabetes . Does not adhere to provider recommendations re: diet and exercise, reports drinking sweet tea daily, no regular exercise does lawn care and bowls weekly Case Manager Clinical Goal(s):  . patient will demonstrate improved adherence to prescribed treatment plan for diabetes self care/management as evidenced by: daily monitoring and recording of CBG  adherence to ADA/ carb modified diet exercise 3 days/week adherence to prescribed medication regimen contacting provider for new or worsened symptoms or questions Interventions:  . Collaboration with Isaac Bliss, Rayford Halsted, MD regarding development and update of comprehensive plan of care as evidenced by provider attestation and co-signature . Inter-disciplinary care team collaboration (see longitudinal plan of care) . Provided education to patient about basic DM disease process . Reviewed medications with patient and discussed importance of medication adherence . Discussed plans with patient for ongoing care management follow up and provided patient with direct contact information for care management team . Reviewed scheduled/upcoming provider appointments including: Cardiology 11/16/20, Dr. Jerilee Hoh 10/03/20 B-12 injection . Advised patient, providing education and rationale, to check cbg 3-4 times a week and record, calling primary care  provider  for findings outside established parameters.   . Review of patient status, including review of consultants reports, relevant laboratory and other test results, and medications completed. Self-Care Activities - Self administers oral medications as prescribed Attends all scheduled provider appointments Checks blood sugars as prescribed and utilize hyper and hypoglycemia protocol  as needed Adheres to prescribed ADA/carb modified Patient Goals: - check blood sugar at prescribed times - check  blood sugar if I feel it is too high or too low - enter blood sugar readings and medication or insulin into daily log - take the blood sugar log to all doctor visits - change to whole grain breads, cereal, pasta - drink 6 to 8 glasses of water each day - fill half of plate with vegetables - read food labels for fat, fiber, carbohydrates and portion size - switch to sugar-free drinks - keep appointment with eye doctor - check feet daily for cuts, sores or redness -drink half and half sweet tea 4 times a week instead of sweet tea Follow Up Plan: Telephone follow up appointment with care management team member scheduled for: 11/14/20 at 10 AM   Patient Care Plan: RNCM:Hypertension (Adult)    Problem Identified: Hypertension (Hypertension)   Priority: Medium    Long-Range Goal: Hypertension Monitored   Start Date: 10/03/2020  Expected End Date: 01/12/2021  This Visit's Progress: On track  Priority: Medium  Note:   Objective:  . Last practice recorded BP readings:  BP Readings from Last 3 Encounters:  09/20/20 140/80  08/30/20 130/80  08/21/20 133/83 .   Marland Kitchen Most recent eGFR/CrCl: No results found for: EGFR  No components found for: CRCL Current Barriers:  Marland Kitchen Knowledge Deficits related to basic understanding of hypertension pathophysiology and self care management . Knowledge Deficits related to understanding of medications prescribed for management of hypertension . Does not adhere to provider recommendations re: diet eats out frequently Case Manager Clinical Goal(s):  . patient will verbalize understanding of plan for hypertension management . patient will attend all scheduled medical appointments: 10/03/20 Dr. Jerilee Hoh B-12 injection, cardiology 11/16/20 . patient will demonstrate improved health management independence as evidenced by checking blood pressure as directed and notifying PCP if SBP>160 or DBP > 90, taking all medications as prescribe, and adhering to a low sodium diet as  discussed. Interventions:  . Collaboration with Isaac Bliss, Rayford Halsted, MD regarding development and update of comprehensive plan of care as evidenced by provider attestation and co-signature . Inter-disciplinary care team collaboration (see longitudinal plan of care) . Evaluation of current treatment plan related to hypertension self management and patient's adherence to plan as established by provider. . Provided education to patient re: stroke prevention, s/s of heart attack and stroke, DASH diet, complications of uncontrolled blood pressure . Reviewed medications with patient and discussed importance of compliance . Discussed plans with patient for ongoing care management follow up and provided patient with direct contact information for care management team . Advised patient, providing education and rationale, to monitor blood pressure daily and record, calling PCP for findings outside established parameters.  Self-Care Activities: - Self administers medications as prescribed Attends all scheduled provider appointments Calls provider office for new concerns, questions, or BP outside discussed parameters Checks BP and records as discussed Follows a low sodium diet/DASH diet Patient Goals: - check blood pressure daily - choose a place to take my blood pressure (home, clinic or office, retail store) - write blood pressure results in a log or diary - ask questions to understand - learn about high blood pressure Follow Up Plan: Telephone follow up appointment with care management team member scheduled for: 11/14/20 at 10 AM

## 2020-10-03 NOTE — Chronic Care Management (AMB) (Signed)
Chronic Care Management   CCM RN Visit Note  10/03/2020 Name: Jeff Jenkins MRN: [id number] DOB: 28-Jun-1956  Subjective: Jeff Jenkins is a 65 y.o. year old male who is a primary care patient of Isaac Bliss, Rayford Halsted, MD. The care management team was consulted for assistance with disease management and care coordination needs.    Engaged with patient by telephone for initial visit in response to provider referral for case management and/or care coordination services.   Consent to Services:  The patient was given the following information about Chronic Care Management services today, agreed to services, and gave verbal consent: 1. CCM service includes personalized support from designated clinical staff supervised by the primary care provider, including individualized plan of care and coordination with other care providers 2. 24/7 contact phone numbers for assistance for urgent and routine care needs. 3. Service will only be billed when office clinical staff spend 20 minutes or more in a month to coordinate care. 4. Only one practitioner may furnish and bill the service in a calendar month. 5.The patient may stop CCM services at any time (effective at the end of the month) by phone call to the office staff. 6. The patient will be responsible for cost sharing (co-pay) of up to 20% of the service fee (after annual deductible is met). Patient agreed to services and consent obtained.  Patient agreed to services and verbal consent obtained.   Assessment: Review of patient past medical history, allergies, medications, health status, including review of consultants reports, laboratory and other test data, was performed as part of comprehensive evaluation and provision of chronic care management services.   SDOH (Social Determinants of Health) assessments and interventions performed:  SDOH Interventions   Flowsheet Row Most Recent Value  SDOH Interventions   Food Insecurity Interventions  Intervention Not Indicated  Financial Strain Interventions Intervention Not Indicated  Housing Interventions Intervention Not Indicated  Stress Interventions Intervention Not Indicated  Transportation Interventions Intervention Not Indicated       CCM Care Plan  No Known Allergies  Outpatient Encounter Medications as of 10/03/2020  Medication Sig  . acetaminophen (TYLENOL) 325 MG tablet Take 650 mg by mouth every 6 (six) hours as needed for moderate pain.  Marland Kitchen aspirin EC 81 MG tablet Take 81 mg by mouth daily. Swallow whole.  Marland Kitchen atorvastatin (LIPITOR) 40 MG tablet Take 1 tablet (40 mg total) by mouth daily.  Marland Kitchen azelastine (ASTELIN) 0.1 % nasal spray Place 1 spray into both nostrils 2 (two) times daily as needed for allergies. Use in each nostril as directed  . celecoxib (CELEBREX) 200 MG capsule Take 200 mg by mouth daily.  . ciprofloxacin (CIPRO) 500 MG tablet Take 1 tablet (500 mg total) by mouth 2 (two) times daily for 10 days.  . diclofenac Sodium (VOLTAREN) 1 % GEL Apply 2 g topically in the morning, at noon, and at bedtime.  Marland Kitchen esomeprazole (NEXIUM) 40 MG capsule TAKE 1 CAPSULE DAILY BEFORE BREAKFAST.  Marland Kitchen ezetimibe (ZETIA) 10 MG tablet Take 1 tablet (10 mg total) by mouth daily.  . ferrous sulfate 325 (65 FE) MG tablet Take 325 mg by mouth 3 (three) times daily.  . fexofenadine (ALLEGRA) 180 MG tablet Take 180 mg by mouth daily.  Marland Kitchen HYDROcodone-acetaminophen (NORCO) 7.5-325 MG tablet Take 1-2 tablets by mouth every 4 (four) hours as needed.  Marland Kitchen ipratropium (ATROVENT) 0.03 % nasal spray INSTILL 2 SPRAYS IN EACH NOSTRIL TWICE A DAY  . Lancets (ONETOUCH ULTRASOFT) lancets 1 each  by Other route daily. Dx E11.9  . lisinopril (ZESTRIL) 5 MG tablet Take 1 tablet (5 mg total) by mouth daily.  . methocarbamol (ROBAXIN) 500 MG tablet Take 500 mg by mouth every 6 (six) hours as needed.  Glory Rosebush ULTRA test strip CHECK BLOOD SUGAR DAILY  . sildenafil (VIAGRA) 100 MG tablet TAKE 1 TABLET BY MOUTH  DAILY AS NEEDED  . SYNJARDY XR 25-1000 MG TB24 Take 1 tablet by mouth daily.  . tadalafil (CIALIS) 5 MG tablet Take 5 mg by mouth daily as needed for erectile dysfunction.   . Testosterone 30 MG/ACT SOLN Place onto the skin daily.   No facility-administered encounter medications on file as of 10/03/2020.    Patient Active Problem List   Diagnosis Date Noted  . Prostate cancer ([place]) 09/20/2020  . Erectile dysfunction 09/15/2019  . Coronary artery disease due to lipid rich plaque 05/20/2019  . Coronary artery calcification seen on CT scan 05/20/2019  . Type 2 diabetes mellitus without obesity ([place]) 03/25/2019  . PVC's (premature ventricular contractions) 03/25/2019  . Elevated blood pressure reading in office with white coat syndrome, without diagnosis of hypertension 03/25/2019  . Pure hypercholesterolemia 03/25/2019  . History of esophageal stricture 02/01/2015  . Special screening for malignant neoplasms, colon 10/15/2010  . HEMORRHOIDS-EXTERNAL 09/15/2008  . HEMORRHOIDS 09/15/2008  . GERD 09/15/2008    Conditions to be addressed/monitored:CAD, HTN and DMII  Care Plan : RNCM:Diabetes Type 2 (Adult)  Updates made by Dimitri Ped, RN since 10/03/2020 12:00 AM    Problem: Glycemic Management (Diabetes, Type 2)   Priority: High    Long-Range Goal: Glycemic Management Optimized   Start Date: 10/03/2020  Expected End Date: 01/12/2021  This Visit's Progress: On track  Priority: High  Note:   Objective:  Lab Results  Component Value Date   HGBA1C 6.1 (A) 09/20/2020 .   Lab Results  Component Value Date   CREATININE 0.74 06/22/2020   CREATININE 0.74 01/27/2020   CREATININE 0.78 04/28/2019 .   Marland Kitchen No results found for: EGFR Current Barriers:  Marland Kitchen Knowledge Deficits related to basic Diabetes pathophysiology and self care/management . Knowledge Deficits related to medications used for management of diabetes . Does not adhere to provider recommendations re: diet and exercise,  reports drinking sweet tea daily, no regular exercise does lawn care and bowls weekly Case Manager Clinical Goal(s):  . patient will demonstrate improved adherence to prescribed treatment plan for diabetes self care/management as evidenced by: daily monitoring and recording of CBG  adherence to ADA/ carb modified diet exercise 3 days/week adherence to prescribed medication regimen contacting provider for new or worsened symptoms or questions Interventions:  . Collaboration with Isaac Bliss, Rayford Halsted, MD regarding development and update of comprehensive plan of care as evidenced by provider attestation and co-signature . Inter-disciplinary care team collaboration (see longitudinal plan of care) . Provided education to patient about basic DM disease process . Reviewed medications with patient and discussed importance of medication adherence . Discussed plans with patient for ongoing care management follow up and provided patient with direct contact information for care management team . Reviewed scheduled/upcoming provider appointments including: Cardiology 11/16/20, Dr. Jerilee Hoh 10/03/20 B-12 injection . Advised patient, providing education and rationale, to check cbg 3-4 times a week and record, calling primary care  provider  for findings outside established parameters.   . Review of patient status, including review of consultants reports, relevant laboratory and other test results, and medications completed. Self-Care Activities - Self administers  oral medications as prescribed Attends all scheduled provider appointments Checks blood sugars as prescribed and utilize hyper and hypoglycemia protocol as needed Adheres to prescribed ADA/carb modified Patient Goals: - check blood sugar at prescribed times - check blood sugar if I feel it is too high or too low - enter blood sugar readings and medication or insulin into daily log - take the blood sugar log to all doctor visits - change to whole  grain breads, cereal, pasta - drink 6 to 8 glasses of water each day - fill half of plate with vegetables - read food labels for fat, fiber, carbohydrates and portion size - switch to sugar-free drinks - keep appointment with eye doctor - check feet daily for cuts, sores or redness -drink half and half sweet tea 4 times a week instead of sweet tea Follow Up Plan: Telephone follow up appointment with care management team member scheduled for: 11/14/20 at 10 AM   Care Plan : RNCM:Hypertension (Adult)  Updates made by Dimitri Ped, RN since 10/03/2020 12:00 AM    Problem: Hypertension (Hypertension)   Priority: Medium    Long-Range Goal: Hypertension Monitored   Start Date: 10/03/2020  Expected End Date: 01/12/2021  This Visit's Progress: On track  Priority: Medium  Note:   Objective:  . Last practice recorded BP readings:  BP Readings from Last 3 Encounters:  09/20/20 140/80  08/30/20 130/80  08/21/20 133/83 .   Marland Kitchen Most recent eGFR/CrCl: No results found for: EGFR  No components found for: CRCL Current Barriers:  Marland Kitchen Knowledge Deficits related to basic understanding of hypertension pathophysiology and self care management . Knowledge Deficits related to understanding of medications prescribed for management of hypertension . Does not adhere to provider recommendations re: diet eats out frequently Case Manager Clinical Goal(s):  . patient will verbalize understanding of plan for hypertension management . patient will attend all scheduled medical appointments: 10/03/20 Dr. Jerilee Hoh B-12 injection, cardiology 11/16/20 . patient will demonstrate improved health management independence as evidenced by checking blood pressure as directed and notifying PCP if SBP>160 or DBP > 90, taking all medications as prescribe, and adhering to a low sodium diet as discussed. Interventions:  . Collaboration with Isaac Bliss, Rayford Halsted, MD regarding development and update of comprehensive plan of care  as evidenced by provider attestation and co-signature . Inter-disciplinary care team collaboration (see longitudinal plan of care) . Evaluation of current treatment plan related to hypertension self management and patient's adherence to plan as established by provider. . Provided education to patient re: stroke prevention, s/s of heart attack and stroke, DASH diet, complications of uncontrolled blood pressure . Reviewed medications with patient and discussed importance of compliance . Discussed plans with patient for ongoing care management follow up and provided patient with direct contact information for care management team . Advised patient, providing education and rationale, to monitor blood pressure daily and record, calling PCP for findings outside established parameters.  Self-Care Activities: - Self administers medications as prescribed Attends all scheduled provider appointments Calls provider office for new concerns, questions, or BP outside discussed parameters Checks BP and records as discussed Follows a low sodium diet/DASH diet Patient Goals: - check blood pressure daily - choose a place to take my blood pressure (home, clinic or office, retail store) - write blood pressure results in a log or diary - ask questions to understand - learn about high blood pressure Follow Up Plan: Telephone follow up appointment with care management team member scheduled for: 11/14/20  at 10 AM     Plan:Telephone follow up appointment with care management team member scheduled for:  11/14/20  Peter Garter RN, [facility], CDE Care Management Coordinator [place] [phone], Mobile [phone]

## 2020-10-10 ENCOUNTER — Ambulatory Visit (INDEPENDENT_AMBULATORY_CARE_PROVIDER_SITE_OTHER): Payer: Medicare HMO

## 2020-10-10 ENCOUNTER — Other Ambulatory Visit: Payer: Self-pay

## 2020-10-10 DIAGNOSIS — E538 Deficiency of other specified B group vitamins: Secondary | ICD-10-CM | POA: Diagnosis not present

## 2020-10-10 MED ORDER — CYANOCOBALAMIN 1000 MCG/ML IJ SOLN
1000.0000 ug | Freq: Once | INTRAMUSCULAR | Status: AC
  Administered 2020-10-10: 1000 ug via INTRAMUSCULAR

## 2020-10-10 NOTE — Progress Notes (Signed)
Per orders of Dr. Jerilee Hoh, injection of B12 given by Rodrigo Ran. Patient tolerated injection well.

## 2020-10-17 ENCOUNTER — Other Ambulatory Visit: Payer: Self-pay

## 2020-10-17 ENCOUNTER — Ambulatory Visit (INDEPENDENT_AMBULATORY_CARE_PROVIDER_SITE_OTHER): Payer: Medicare HMO

## 2020-10-17 DIAGNOSIS — E538 Deficiency of other specified B group vitamins: Secondary | ICD-10-CM | POA: Diagnosis not present

## 2020-10-17 MED ORDER — CYANOCOBALAMIN 1000 MCG/ML IJ SOLN
1000.0000 ug | Freq: Once | INTRAMUSCULAR | Status: AC
  Administered 2020-10-17: 1000 ug via INTRAMUSCULAR

## 2020-10-17 NOTE — Progress Notes (Signed)
Per orders of Dr. Jerilee Hoh, injection of B12 given by Anderson Malta. Patient tolerated injection well.

## 2020-10-18 ENCOUNTER — Encounter: Payer: Self-pay | Admitting: Internal Medicine

## 2020-10-27 DIAGNOSIS — Z7982 Long term (current) use of aspirin: Secondary | ICD-10-CM | POA: Diagnosis not present

## 2020-10-27 DIAGNOSIS — J309 Allergic rhinitis, unspecified: Secondary | ICD-10-CM | POA: Diagnosis not present

## 2020-10-27 DIAGNOSIS — K219 Gastro-esophageal reflux disease without esophagitis: Secondary | ICD-10-CM | POA: Diagnosis not present

## 2020-10-27 DIAGNOSIS — E119 Type 2 diabetes mellitus without complications: Secondary | ICD-10-CM | POA: Diagnosis not present

## 2020-10-27 DIAGNOSIS — I10 Essential (primary) hypertension: Secondary | ICD-10-CM | POA: Diagnosis not present

## 2020-10-27 DIAGNOSIS — N393 Stress incontinence (female) (male): Secondary | ICD-10-CM | POA: Diagnosis not present

## 2020-10-27 DIAGNOSIS — G8929 Other chronic pain: Secondary | ICD-10-CM | POA: Diagnosis not present

## 2020-10-27 DIAGNOSIS — E785 Hyperlipidemia, unspecified: Secondary | ICD-10-CM | POA: Diagnosis not present

## 2020-10-27 DIAGNOSIS — N529 Male erectile dysfunction, unspecified: Secondary | ICD-10-CM | POA: Diagnosis not present

## 2020-10-27 DIAGNOSIS — M1990 Unspecified osteoarthritis, unspecified site: Secondary | ICD-10-CM | POA: Diagnosis not present

## 2020-10-31 DIAGNOSIS — M25562 Pain in left knee: Secondary | ICD-10-CM | POA: Diagnosis not present

## 2020-10-31 DIAGNOSIS — M1712 Unilateral primary osteoarthritis, left knee: Secondary | ICD-10-CM | POA: Diagnosis not present

## 2020-11-10 DIAGNOSIS — L570 Actinic keratosis: Secondary | ICD-10-CM | POA: Diagnosis not present

## 2020-11-10 DIAGNOSIS — X32XXXD Exposure to sunlight, subsequent encounter: Secondary | ICD-10-CM | POA: Diagnosis not present

## 2020-11-10 DIAGNOSIS — D224 Melanocytic nevi of scalp and neck: Secondary | ICD-10-CM | POA: Diagnosis not present

## 2020-11-10 DIAGNOSIS — D225 Melanocytic nevi of trunk: Secondary | ICD-10-CM | POA: Diagnosis not present

## 2020-11-14 ENCOUNTER — Ambulatory Visit (INDEPENDENT_AMBULATORY_CARE_PROVIDER_SITE_OTHER): Payer: Medicare HMO

## 2020-11-14 DIAGNOSIS — E7800 Pure hypercholesterolemia, unspecified: Secondary | ICD-10-CM

## 2020-11-14 DIAGNOSIS — E119 Type 2 diabetes mellitus without complications: Secondary | ICD-10-CM

## 2020-11-14 DIAGNOSIS — R030 Elevated blood-pressure reading, without diagnosis of hypertension: Secondary | ICD-10-CM

## 2020-11-14 NOTE — Patient Instructions (Signed)
Visit Information  PATIENT GOALS: Goals Addressed            This Visit's Progress   . Monitor and Manage My Blood Sugar-Diabetes Type 2   On track    Timeframe:  Long-Range Goal Priority:  High Start Date:       10/03/20                      Expected End Date:           05/14/21            Follow Up Date 01/16/2021 at 10 AM    - check blood sugar at prescribed times - check blood sugar if I feel it is too high or too low - enter blood sugar readings and medication or insulin into daily log - take the blood sugar log to all doctor visits  -continue to try drinking half and half sweet tea 4 times a week instead of sweet tea   Why is this important?    Checking your blood sugar at home helps to keep it from getting very high or very low.   Writing the results in a diary or log helps the doctor know how to care for you.   Your blood sugar log should have the time, date and the results.   Also, write down the amount of insulin or other medicine that you take.   Other information, like what you ate, exercise done and how you were feeling, will also be helpful.     Notes:     . Track and Manage My Blood Pressure-Hypertension   On track    Timeframe:  Long-Range Goal Priority:  Medium Start Date:      10/03/20                       Expected End Date:     05/14/21                 Follow Up Date 01/16/2021    - check blood pressure daily - choose a place to take my blood pressure (home, clinic or office, retail store) - write blood pressure results in a log or diary    Why is this important?    You won't feel high blood pressure, but it can still hurt your blood vessels.   High blood pressure can cause heart or kidney problems. It can also cause a stroke.   Making lifestyle changes like losing a little weight or eating less salt will help.   Checking your blood pressure at home and at different times of the day can help to control blood pressure.   If the doctor  prescribes medicine remember to take it the way the doctor ordered.   Call the office if you cannot afford the medicine or if there are questions about it.     Notes:        Diabetes Mellitus Action Plan Following a diabetes action plan is a way for you to manage your diabetes (diabetes mellitus) symptoms. The plan is color-coded to help you understand what actions you need to take based on any symptoms you are having.  If you have symptoms in the red zone, you need medical care right away.  If you have symptoms in the yellow zone, you are having problems.  If you have symptoms in the green zone, you are doing well. Learning about and understanding diabetes can  take time. Follow the plan that you develop with your health care provider. Know the target range for your blood sugar (glucose) level, and review your treatment plan with your health care provider at each visit. The target range for my blood sugar level is __________________________ mg/dL. Red zone Get medical help right away if you have any of the following symptoms:  A blood sugar test result that is below 54 mg/dL (3 mmol/L).  A blood sugar test result that is at or above 240 mg/dL (13.3 mmol/L) for 2 days in a row.  Confusion or trouble thinking clearly.  Difficulty breathing.  Sickness or a fever for 2 or more days that is not getting better.  Moderate or large ketone levels in your urine.  Feeling tired or having no energy. If you have any red zone symptoms, do not wait to see if the symptoms will go away. Get medical help right away. Call your local emergency services (911 in the [place] Do not drive yourself to the hospital. If you have severely low blood sugar (severe hypoglycemia) and you cannot eat or drink, you may need glucagon. Make sure a family member or close friend knows how to check your blood sugar and how to give you glucagon. You may need to be treated in a hospital for this condition.   Yellow  zone If you have any of the following symptoms, your diabetes is not under control and you may need to make some changes:  A blood sugar test result that is at or above 240 mg/dL (13.3 mmol/L) for 2 days in a row.  Blood sugar test results that are below 70 mg/dL (3.9 mmol/L).  Other symptoms of hypoglycemia, such as: ? Shaking or feeling light-headed. ? Confusion or irritability. ? Feeling hungry. ? Having a fast heartbeat. If you have any yellow zone symptoms:  Treat your hypoglycemia by eating or drinking 15 grams of a rapid-acting carbohydrate. Follow the 15:15 rule: ? Take 15 grams of a rapid-acting carbohydrate, such as:  1 tube of glucose gel.  4 glucose pills.  4 oz (120 mL) of fruit juice.  4 oz (120 mL) of regular (not diet) soda. ? Check your blood sugar 15 minutes after you take the carbohydrate. ? If the repeat blood sugar test is still at or below 70 mg/dL (3.9 mmol/L), take 15 grams of a carbohydrate again. ? If your blood sugar does not increase above 70 mg/dL (3.9 mmol/L) after 3 tries, get medical help right away. ? After your blood sugar returns to normal, eat a meal or a snack within 1 hour.  Keep taking your daily medicines as told by your health care provider.  Check your blood sugar more often than you normally would. ? Write down your results. ? Call your health care provider if you have trouble keeping your blood sugar in your target range.   Green zone These signs mean you are doing well and you can continue what you are doing to manage your diabetes:  Your blood sugar is within your personal target range. For most people, a blood sugar level before a meal (preprandial) should be 80-130 mg/dL (4.4-7.2 mmol/L).  You feel well, and you are able to do daily activities. If you are in the green zone, continue to manage your diabetes as told by your health care provider. To do this:  Eat a healthy diet.  Exercise regularly.  Check your blood sugar as  told by your health care provider.  Take your medicines as told by your health care provider.   Where to find more information  American Diabetes Association (ADA): diabetes.org  Association of Diabetes Care & Education Specialists (ADCES): diabeteseducator.org Summary  Following a diabetes action plan is a way for you to manage your diabetes symptoms. The plan is color-coded to help you understand what actions you need to take based on any symptoms you are having.  Follow the plan that you develop with your health care provider. Make sure you know your personal target blood sugar level.  Review your treatment plan with your health care provider at each visit. This information is not intended to replace advice given to you by your health care provider. Make sure you discuss any questions you have with your health care provider. Document Revised: 01/06/2020 Document Reviewed: 01/06/2020 Elsevier Patient Education  [place].  Patient verbalizes understanding of instructions provided today and agrees to view in [place].   Telephone follow up appointment with care management team member scheduled for:01/16/21 at [place], [facility], CDE Care Management Coordinator [place] [phone], Mobile [phone]

## 2020-11-14 NOTE — Chronic Care Management (AMB) (Signed)
Chronic Care Management   CCM RN Visit Note  11/14/2020 Name: Jeff Jenkins MRN: [id number] DOB: 01/04/1956  Subjective: Jeff Jenkins is a 65 y.o. year old male who is a primary care patient of Isaac Bliss, Rayford Halsted, MD. The care management team was consulted for assistance with disease management and care coordination needs.    Engaged with patient by telephone for follow up visit in response to provider referral for case management and/or care coordination services.   Consent to Services:  The patient was given information about Chronic Care Management services, agreed to services, and gave verbal consent prior to initiation of services.  Please see initial visit note for detailed documentation.   Patient agreed to services and verbal consent obtained.   Assessment: Review of patient past medical history, allergies, medications, health status, including review of consultants reports, laboratory and other test data, was performed as part of comprehensive evaluation and provision of chronic care management services.   SDOH (Social Determinants of Health) assessments and interventions performed:    CCM Care Plan  No Known Allergies  Outpatient Encounter Medications as of 11/14/2020  Medication Sig  . acetaminophen (TYLENOL) 325 MG tablet Take 650 mg by mouth every 6 (six) hours as needed for moderate pain.  Marland Kitchen aspirin EC 81 MG tablet Take 81 mg by mouth daily. Swallow whole.  Marland Kitchen atorvastatin (LIPITOR) 40 MG tablet Take 1 tablet (40 mg total) by mouth daily.  Marland Kitchen azelastine (ASTELIN) 0.1 % nasal spray Place 1 spray into both nostrils 2 (two) times daily as needed for allergies. Use in each nostril as directed  . celecoxib (CELEBREX) 200 MG capsule Take 200 mg by mouth daily.  . diclofenac Sodium (VOLTAREN) 1 % GEL Apply 2 g topically in the morning, at noon, and at bedtime.  Marland Kitchen esomeprazole (NEXIUM) 40 MG capsule TAKE 1 CAPSULE DAILY BEFORE BREAKFAST.  Marland Kitchen ezetimibe (ZETIA) 10  MG tablet Take 1 tablet (10 mg total) by mouth daily.  . ferrous sulfate 325 (65 FE) MG tablet Take 325 mg by mouth 3 (three) times daily.  . fexofenadine (ALLEGRA) 180 MG tablet Take 180 mg by mouth daily.  Marland Kitchen HYDROcodone-acetaminophen (NORCO) 7.5-325 MG tablet Take 1-2 tablets by mouth every 4 (four) hours as needed.  Marland Kitchen ipratropium (ATROVENT) 0.03 % nasal spray INSTILL 2 SPRAYS IN EACH NOSTRIL TWICE A DAY  . Lancets (ONETOUCH ULTRASOFT) lancets 1 each by Other route daily. Dx E11.9  . lisinopril (ZESTRIL) 5 MG tablet Take 1 tablet (5 mg total) by mouth daily.  . methocarbamol (ROBAXIN) 500 MG tablet Take 500 mg by mouth every 6 (six) hours as needed.  Glory Rosebush ULTRA test strip CHECK BLOOD SUGAR DAILY  . sildenafil (VIAGRA) 100 MG tablet TAKE 1 TABLET BY MOUTH DAILY AS NEEDED  . SYNJARDY XR 25-1000 MG TB24 Take 1 tablet by mouth daily.  . tadalafil (CIALIS) 5 MG tablet Take 5 mg by mouth daily as needed for erectile dysfunction.   . Testosterone 30 MG/ACT SOLN Place onto the skin daily.   No facility-administered encounter medications on file as of 11/14/2020.    Patient Active Problem List   Diagnosis Date Noted  . Prostate cancer ([place]) 09/20/2020  . Erectile dysfunction 09/15/2019  . Coronary artery disease due to lipid rich plaque 05/20/2019  . Coronary artery calcification seen on CT scan 05/20/2019  . Type 2 diabetes mellitus without obesity ([place]) 03/25/2019  . PVC's (premature ventricular contractions) 03/25/2019  . Elevated blood pressure reading  in office with white coat syndrome, without diagnosis of hypertension 03/25/2019  . Pure hypercholesterolemia 03/25/2019  . History of esophageal stricture 02/01/2015  . Special screening for malignant neoplasms, colon 10/15/2010  . HEMORRHOIDS-EXTERNAL 09/15/2008  . HEMORRHOIDS 09/15/2008  . GERD 09/15/2008    Conditions to be addressed/monitored:HTN and DMII  Care Plan : RNCM:Diabetes Type 2 (Adult)  Updates made by Dimitri Ped, RN since 11/14/2020 12:00 AM    Problem: Glycemic Management (Diabetes, Type 2)   Priority: High    Long-Range Goal: Glycemic Management Optimized   Start Date: 10/03/2020  Expected End Date: 05/14/2021  This Visit's Progress: On track  Recent Progress: On track  Priority: High  Note:   Objective:  Lab Results  Component Value Date   HGBA1C 6.1 (A) 09/20/2020 .   Lab Results  Component Value Date   CREATININE 0.74 06/22/2020   CREATININE 0.74 01/27/2020   CREATININE 0.78 04/28/2019 .   Marland Kitchen No results found for: EGFR Current Barriers:  Marland Kitchen Knowledge Deficits related to basic Diabetes pathophysiology and self care/management in pt with other chronic conditions of HLD and HTN . Knowledge Deficits related to medications used for management of diabetes . Does not adhere to provider recommendations re: diet and exercise, reports CBG checks once a week range from 112-146 fasting in AM, reports that the 146 reading was the day after he ate banana pudding, denies any low readings, states he is working on drinking less sweet tea, reports doing yard work and helping his son farm for exercise, states his rt needs to be replaced but he plans to do it in December, states that his insurance company sent a doctor/NP out to do a physical on him and is Hemoglobin A1C was 6% Case Manager Clinical Goal(s):  . patient will demonstrate improved adherence to prescribed treatment plan for diabetes self care/management as evidenced by: daily monitoring and recording of CBG  adherence to ADA/ carb modified diet exercise 3 days/week adherence to prescribed medication regimen contacting provider for new or worsened symptoms or questions Interventions:  . Collaboration with Isaac Bliss, Rayford Halsted, MD regarding development and update of comprehensive plan of care as evidenced by provider attestation and co-signature . Inter-disciplinary care team collaboration (see longitudinal plan of care) . Reviewed  education to patient about basic DM disease process . Reviewed medications with patient and discussed importance of medication adherence . Discussed plans with patient for ongoing care management follow up and provided patient with direct contact information for care management team . Reviewed scheduled/upcoming provider appointments including: Cardiology 12/12/20, Dr. Jerilee Hoh 11/15/20 B-12 injection, Dr. Jerilee Hoh 12/22/20 . Reinforced to check cbg 3-4 times a week and record, calling primary care  provider  for findings outside established parameters.   . Review of patient status, including review of consultants reports, relevant laboratory and other test results, and medications completed. . Reviewed s/sx of hypoglycemia and the rule of 15 to treat hypoglycemia . Encouraged to continue to try limiting drinking sweet tea and concentrated sweets . Reviewed routine foot care and foot checks Self-Care Activities - Self administers oral medications as prescribed Attends all scheduled provider appointments Checks blood sugars as prescribed and utilize hyper and hypoglycemia protocol as needed Adheres to prescribed ADA/carb modified Patient Goals: - check blood sugar at prescribed times - check blood sugar if I feel it is too high or too low - enter blood sugar readings and medication or insulin into daily log - take the blood sugar log to  all doctor visits - change to whole grain breads, cereal, pasta - drink 6 to 8 glasses of water each day - fill half of plate with vegetables - read food labels for fat, fiber, carbohydrates and portion size - switch to sugar-free drinks - keep appointment with eye doctor - check feet daily for cuts, sores or redness -continue to try drinking half and half sweet tea 4 times a week instead of sweet tea Follow Up Plan: Telephone follow up appointment with care management team member scheduled for: 01/16/21 at 10 AM   Care Plan : RNCM:Hypertension (Adult)   Updates made by Dimitri Ped, RN since 11/14/2020 12:00 AM    Problem: Hypertension (Hypertension)   Priority: Medium    Long-Range Goal: Hypertension Monitored   Start Date: 10/03/2020  Expected End Date: 05/14/2021  This Visit's Progress: On track  Recent Progress: On track  Priority: Medium  Note:   Objective:  . Last practice recorded BP readings:  BP Readings from Last 3 Encounters:  09/20/20 140/80  08/30/20 130/80  08/21/20 133/83 .   Marland Kitchen Most recent eGFR/CrCl: No results found for: EGFR  No components found for: CRCL Current Barriers:  Marland Kitchen Knowledge Deficits related to basic understanding of hypertension pathophysiology and self care management . Knowledge Deficits related to understanding of medications prescribed for management of hypertension . Does not adhere to provider recommendations re: diet eats out frequently . Reports B/P ranging from 110/74 to 134/76 when checking at home, reports he checks in the evening when he is relaxed, reports he does yard work and helps his son farm for exercise, reports trying to follow a low sodium diet most of the time Case Manager Clinical Goal(s):  . patient will verbalize understanding of plan for hypertension management . patient will attend all scheduled medical appointments: 12/22/20 Dr. Jerilee Hoh 11/15/20 B-12 injection, cardiology 12/12/20 . patient will demonstrate improved health management independence as evidenced by checking blood pressure as directed and notifying PCP if SBP>160 or DBP > 90, taking all medications as prescribe, and adhering to a low sodium diet as discussed. Interventions:  . Collaboration with Isaac Bliss, Rayford Halsted, MD regarding development and update of comprehensive plan of care as evidenced by provider attestation and co-signature . Inter-disciplinary care team collaboration (see longitudinal plan of care) . Evaluation of current treatment plan related to hypertension self management and patient's  adherence to plan as established by provider. . Reinforced education to patient on stroke prevention, s/s of heart attack and stroke, DASH diet, complications of uncontrolled blood pressure . Reviewed medications with patient and discussed importance of compliance . Discussed plans with patient for ongoing care management follow up and provided patient with direct contact information for care management team . Reinforced to monitor blood pressure daily and record, calling PCP for findings outside established parameters.  . Encouraged to try checking B/P at different times  . Reviewed scheduled/upcoming provider appointments including: 12/22/20 Dr. Jerilee Hoh 11/15/20 B-12 injection, cardiology 12/12/20 Self-Care Activities: - Self administers medications as prescribed Attends all scheduled provider appointments Calls provider office for new concerns, questions, or BP outside discussed parameters Checks BP and records as discussed Follows a low sodium diet/DASH diet Patient Goals: - check blood pressure daily - choose a place to take my blood pressure (home, clinic or office, retail store) - write blood pressure results in a log or diary - ask questions to understand - learn about high blood pressure Follow Up Plan: Telephone follow up appointment with care management  team member scheduled for: 01/16/21 at 10 AM     Plan:Telephone follow up appointment with care management team member scheduled for:  01/16/21 and The patient has been provided with contact information for the care management team and has been advised to call with any health related questions or concerns.  Peter Garter RN, Jackquline Denmark, CDE Care Management Coordinator [facility] [phone], Mobile [phone]

## 2020-11-15 ENCOUNTER — Other Ambulatory Visit: Payer: Self-pay

## 2020-11-15 ENCOUNTER — Ambulatory Visit (INDEPENDENT_AMBULATORY_CARE_PROVIDER_SITE_OTHER): Payer: Medicare HMO | Admitting: *Deleted

## 2020-11-15 DIAGNOSIS — E538 Deficiency of other specified B group vitamins: Secondary | ICD-10-CM

## 2020-11-15 MED ORDER — CYANOCOBALAMIN 1000 MCG/ML IJ SOLN
1000.0000 ug | Freq: Once | INTRAMUSCULAR | Status: AC
Start: 2020-11-15 — End: 2020-11-15
  Administered 2020-11-15: 1000 ug via INTRAMUSCULAR

## 2020-11-15 NOTE — Progress Notes (Signed)
Per orders of Dr. Hernandez, injection of B12 given by Xaidyn Kepner. Patient tolerated injection well.  

## 2020-11-16 ENCOUNTER — Ambulatory Visit: Payer: Medicare HMO | Admitting: Cardiology

## 2020-12-12 ENCOUNTER — Other Ambulatory Visit: Payer: Self-pay

## 2020-12-12 ENCOUNTER — Encounter: Payer: Self-pay | Admitting: Cardiology

## 2020-12-12 ENCOUNTER — Ambulatory Visit: Payer: Medicare HMO | Admitting: Cardiology

## 2020-12-12 VITALS — BP 134/74 | HR 74 | Ht 72.0 in | Wt 164.0 lb

## 2020-12-12 DIAGNOSIS — Z7189 Other specified counseling: Secondary | ICD-10-CM

## 2020-12-12 DIAGNOSIS — R079 Chest pain, unspecified: Secondary | ICD-10-CM | POA: Diagnosis not present

## 2020-12-12 DIAGNOSIS — I2583 Coronary atherosclerosis due to lipid rich plaque: Secondary | ICD-10-CM | POA: Diagnosis not present

## 2020-12-12 DIAGNOSIS — E7800 Pure hypercholesterolemia, unspecified: Secondary | ICD-10-CM

## 2020-12-12 DIAGNOSIS — I2510 Atherosclerotic heart disease of native coronary artery without angina pectoris: Secondary | ICD-10-CM

## 2020-12-12 DIAGNOSIS — E119 Type 2 diabetes mellitus without complications: Secondary | ICD-10-CM | POA: Diagnosis not present

## 2020-12-12 NOTE — Progress Notes (Signed)
Cardiology Office Note:    Date:  12/12/2020   ID:  Jeff Jenkins, DOB 04-27-1956, MRN [id number]  PCP:  Isaac Bliss, Rayford Halsted, MD  Cardiologist:  Buford Dresser, MD  Referring MD: Isaac Bliss, [place]*   No chief complaint on file. CC: Follow-up  History of Present Illness:    Jeff Jenkins is a 65 y.o. male with a hx of allergies, type II diabetes lifestyle controlled who is seen for follow up today. He was initially seen 04/04/19 as a new consult at the request of Isaac Bliss, [place]* for the evaluation and management of elevated cardiovascular risk.  CV history/risk factors: Strong family history: lost mother to his heart attack suddenly at age 99 (had evidence of old MI that was unknown). Lost his brother to a massive stroke earlier this year at age 45.   Calcium noted on CXR 02/2019, started on rosuvastatin but recently stopped due to myalgia. Risk factors of type II diabetes. CT cardiac done 05/18/19, CAD noted with high calcium score, reviewed below.  Today:  He is accompanied by his wife. Overall, he is feeling alright aside from an episode this past weekend. While shoveling, he had severe, sharp chest pain in his right chest with right arm weakness/numbness that caused him to drop the shovel. The duration was about 5 minutes, and the pain was slightly relieved by gripping and holding the area with his hand. This was the most severe episode, he notes having some other minor episodes since his last visit.   He is taking allegra daily, and notices some improvement in his breathing. Occasionally he still has minor episodes of not being able to catch his breath.   He denies any palpitations, headaches, lightheadedness, or syncope. Also has no lower extremity edema, orthopnea or PND.  Due to his knee pain he has taken Aleve for up to 2 days at a time. He is scheduled for a knee replacement in December.  Past Medical History:  Diagnosis Date  . Allergy    . Arthritis   . Broken ankle 2008  . Coronary artery disease   . Diverticulosis   . ED (erectile dysfunction)   . Facial fracture ([place]) 1979  . GERD (gastroesophageal reflux disease)   . Hearing loss in right ear   . Hemorrhoids   . Hiatal hernia   . Hyperlipidemia   . Hypertension   . Peptic stricture of esophagus   . Peptic ulcer disease 1989  . Prostate cancer ([place]) 02/2009  . Seasonal allergies   . Type 2 diabetes mellitus ([place])     Past Surgical History:  Procedure Laterality Date  . ANKLE SURGERY Left 2008  . CHOLECYSTECTOMY N/A 01/31/2020   Procedure: LAPAROSCOPIC CHOLECYSTECTOMY WITH INTRAOPERATIVE CHOLANGIOGRAM;  Surgeon: Clovis Riley, MD;  Location: [facility];  Service: General;  Laterality: N/A;  . FACIAL FRACTURE SURGERY    . INGUINAL HERNIA REPAIR Left   . KNEE ARTHROSCOPY Left 2013  . KNEE ARTHROSCOPY  [id number]   [facility]  . PROSTATECTOMY  2011  . TONSILLECTOMY    . UPPER GASTROINTESTINAL ENDOSCOPY    . VASECTOMY      Current Medications: Current Outpatient Medications on File Prior to Visit  Medication Sig  . acetaminophen (TYLENOL) 325 MG tablet Take 650 mg by mouth every 6 (six) hours as needed for moderate pain.  Marland Kitchen aspirin EC 81 MG tablet Take 81 mg by mouth daily. Swallow whole.  Marland Kitchen atorvastatin (LIPITOR) 40 MG tablet Take 1  tablet (40 mg total) by mouth daily.  Marland Kitchen azelastine (ASTELIN) 0.1 % nasal spray Place 1 spray into both nostrils 2 (two) times daily as needed for allergies. Use in each nostril as directed  . celecoxib (CELEBREX) 200 MG capsule Take 200 mg by mouth daily.  . diclofenac Sodium (VOLTAREN) 1 % GEL Apply 2 g topically in the morning, at noon, and at bedtime.  Marland Kitchen esomeprazole (NEXIUM) 40 MG capsule TAKE 1 CAPSULE DAILY BEFORE BREAKFAST.  Marland Kitchen ezetimibe (ZETIA) 10 MG tablet Take 1 tablet (10 mg total) by mouth daily.  . ferrous sulfate 325 (65 FE) MG tablet Take 325 mg by mouth 3 (three) times daily.  . fexofenadine (ALLEGRA) 180 MG  tablet Take 180 mg by mouth daily.  Marland Kitchen HYDROcodone-acetaminophen (NORCO) 7.5-325 MG tablet Take 1-2 tablets by mouth every 4 (four) hours as needed.  Marland Kitchen ipratropium (ATROVENT) 0.03 % nasal spray INSTILL 2 SPRAYS IN EACH NOSTRIL TWICE A DAY  . Lancets (ONETOUCH ULTRASOFT) lancets 1 each by Other route daily. Dx E11.9  . lisinopril (ZESTRIL) 5 MG tablet Take 1 tablet (5 mg total) by mouth daily.  . methocarbamol (ROBAXIN) 500 MG tablet Take 500 mg by mouth every 6 (six) hours as needed.  Glory Rosebush ULTRA test strip CHECK BLOOD SUGAR DAILY  . sildenafil (VIAGRA) 100 MG tablet TAKE 1 TABLET BY MOUTH DAILY AS NEEDED  . SYNJARDY XR 25-1000 MG TB24 Take 1 tablet by mouth daily.  . tadalafil (CIALIS) 5 MG tablet Take 5 mg by mouth daily as needed for erectile dysfunction.   . Testosterone 30 MG/ACT SOLN Place onto the skin daily.   No current facility-administered medications on file prior to visit.     Allergies:   Patient has no known allergies.   Social History   Tobacco Use  . Smoking status: Never Smoker  . Smokeless tobacco: Never Used  Vaping Use  . Vaping Use: Never used  Substance Use Topics  . Alcohol use: Yes    Alcohol/week: 12.0 standard drinks    Types: 12 Standard drinks or equivalent per week    Comment: social   . Drug use: No    Family History: The patient's family history includes Alcohol abuse in his father; Breast cancer in his mother; Diabetes in his mother; Heart attack in his mother; Stroke in his brother. There is no history of Colon cancer, Esophageal cancer, Rectal cancer, or Stomach cancer.  ROS:   Please see the history of present illness.   (+) Severe, sharp Right chest pain (+) Right UE weakness/numbness (+) Shortness of breath Additional pertinent ROS otherwise negative  EKGs/Labs/Other Studies Reviewed:    The following studies were reviewed today: Cardiac CT 05-18-19 Aorta: Mildly dilated aortic root (42 mm). Minimal calcifications. No dissection.   Aortic Valve:  Trileaflet.  No calcifications. Coronary Arteries:  Normal coronary origin.  Right dominance.  RCA is a large dominant artery that gives rise to PDA and PLVB. There is diffuse minimal calcified plaque (0-24%); mild calcified plaque (25-49%) at takeoff of PDA. Minimal plaque (0-24%) in posterolateral.  Left main is a large artery that gives rise to LAD and LCX arteries. There is no plaque.  LAD is a large vessel that has 2 diagonals. There is calcified plaque in the proximal (50-69%) and mid vessel (0-24%); there is mild calcified plaque (25-49%) in proximal D1.  LCX is a non-dominant artery that gives rise to small OM1 and OM2. There is minimal calcified plaque (0-24%) noted.  Other findings: Normal pulmonary vein drainage into the left atrium. Normal let atrial appendage without a thrombus. 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  IMPRESSION: 1. Coronary calcium score of 2319. This was 71 percentile for age and sex matched control.  2. Normal coronary origin with right dominance.  3. Diffuse nonobstructive 3 vessel calcified plaque; 50-69% stenosis in proximal LAD; 25-49% stenosis proximal D1 and in RCA at takeoff of PDA. CADRADS-3.  Monitor report 04/2019 14 days of data recorded on Zio monitor. Patient had a min HR of 48 bpm, max HR of 184 bpm, and avg HR of 86 bpm. Predominant underlying rhythm was Sinus Rhythm. No VT, SVT, atrial fibrillation, high degree block, or pauses noted. Isolated atrial ectopy was rare (<1%). Isolated ventricular ectopy was occasional (2.6%). There was a single 4 beat run of SVT and a single 4 beat run of PVCs.  Longest ventricular bigeminy was 23 seconds, and longest ventricular trigeminy episode was 38 seconds. There were 0 triggered events.    EKG:  EKG is personally reviewed.  12/12/20: SR with PVC couplet, HR 93 bpm 03/25/19: SR with PVCs--two morphologies, and 2 PVCs are consecutive  Recent Labs: 06/22/2020: ALT 19; BUN  18; Creat 0.74; Potassium 4.5; Sodium 140; TSH 1.12 09/20/2020: Hemoglobin 14.8; Platelets 180  Recent Lipid Panel    Component Value Date/Time   CHOL 139 06/22/2020 1052   CHOL 170 08/23/2019 1221   TRIG 57 06/22/2020 1052   HDL 73 06/22/2020 1052   HDL 66 08/23/2019 1221   CHOLHDL 1.9 06/22/2020 1052   LDLCALC 53 06/22/2020 1052    Physical Exam:    VS:  BP 134/74 (BP Location: Left Arm, Patient Position: Sitting, Cuff Size: Normal)   Pulse 74   Ht 6' (1.829 m)   Wt 164 lb (74.4 kg)   BMI 22.24 kg/m     Wt Readings from Last 3 Encounters:  12/12/20 164 lb (74.4 kg)  09/20/20 169 lb 8 oz (76.9 kg)  08/30/20 169 lb 14.4 oz (77.1 kg)   GEN: Well nourished, well developed in no acute distress HEENT: Normal, moist mucous membranes NECK: No JVD CARDIAC: regular rhythm, normal S1 and S2, no rubs or gallops. No murmur. VASCULAR: Radial and DP pulses 2+ bilaterally. No carotid bruits RESPIRATORY:  Clear to auscultation without rales, wheezing or rhonchi  ABDOMEN: Soft, non-tender, non-distended MUSCULOSKELETAL:  Ambulates independently.  SKIN: Warm and dry, no edema NEUROLOGIC:  Alert and oriented x 3. No focal neuro deficits noted. PSYCHIATRIC:  Normal affect     ASSESSMENT:    1. Chest pain of uncertain etiology   2. Coronary artery disease due to lipid rich plaque   3. Pure hypercholesterolemia   4. Type 2 diabetes mellitus without obesity (HCC)   5. Cardiac risk counseling   6. Counseling on health promotion and disease prevention    PLAN:    Musculoskeletal pain: after shoveling, right sided, self resolved. Discussed red flag warning signs that need immediate medical attention  Evidence of significant CAD on cardiac CT: while no plaque with >70% stenosis suggest that chest pain is not ischemic in origin, his very high calcium score and mixed/lipid rich plaque in proximal LAD is concerning. Has diffuse calcified plaue in 3 vessels as well as focal mixed/lipid rich  plaque -continue aspirin 81 mg  -tolerating statin, as below  Hypercholesterolemia: goal LDL <70 given CAD -he is tolerating atorvastatin 40 mg daily, continue -on ezetimibe 10 mg daily as well -From 02/08/19 (prior  to statin): Tchol 181, TG 102, HDL 37, LDL 124 -post statin lipids 08/23/19: Tchol 170, TG 70, HDL 66, LDL 91 -lipids 06/22/20: Tchol 139, HDL 73, LDL 53, TG 57 -continuing to work on lifestyle  Erectile dysfunction: see prior discussion re: PDE5i  PVCs: burden <3%.  -currently asymptomatic, but if worsens consider echo in the future. Have discussed beta blockers, declines currently. Monitor for symptoms.  -PVC couplet on ECG today  Type II diabetes without obesity: BMI 22 -on synjardy, which contains empagliflozin and metformin. Therefore is receiving SGLT2i benefit with CAD  Cardiac risk counseling and prevention recommendations: -recommend heart healthy/Mediterranean diet, with whole grains, fruits, vegetable, fish, lean meats, nuts, and olive oil. Limit salt. -recommend moderate walking, 3-5 times/week for 30-50 minutes each session. Aim for at least 150 minutes.week. Goal should be pace of 3 miles/hours, or walking 1.5 miles in 30 minutes -recommend avoidance of tobacco products. Avoid excess alcohol.  Plan for follow up: 6 mos or sooner as needed   Medication Adjustments/Labs and Tests Ordered: Current medicines are reviewed at length with the patient today.  Concerns regarding medicines are outlined above.  Orders Placed This Encounter  Procedures  . EKG 12-Lead   No orders of the defined types were placed in this encounter.   Patient Instructions  Medication Instructions:  Your Physician recommend you continue on your current medication as directed.    *If you need a refill on your cardiac medications before your next appointment, please call your pharmacy*   Lab Work: None ordered today   Testing/Procedures: None ordered today   Follow-Up: At  [facility], you and your health needs are our priority.  As part of our continuing mission to provide you with exceptional heart care, we have created designated Provider Care Teams.  These Care Teams include your primary Cardiologist (physician) and Advanced Practice Providers (APPs -  Physician Assistants and Nurse Practitioners) who all work together to provide you with the care you need, when you need it.  We recommend signing up for the patient portal called "MyChart".  Sign up information is provided on this After Visit Summary.  MyChart is used to connect with patients for Virtual Visits (Telemedicine).  Patients are able to view lab/test results, encounter notes, upcoming appointments, etc.  Non-urgent messages can be sent to your provider as well.   To learn more about what you can do with MyChart, go to [email or web address].    Your next appointment:   6 month(s) @ [address] [place], [place] [address]   The format for your next appointment:   In Person  Provider:   Buford Dresser, MD        [facility],acting as a scribe for Buford Dresser, MD.,have documented all relevant documentation on the behalf of Buford Dresser, MD,as directed by  Buford Dresser, MD while in the presence of Buford Dresser, MD.  I, Buford Dresser, MD, have reviewed all documentation for this visit. The documentation on 12/19/20 for the exam, diagnosis, procedures, and orders are all accurate and complete.  Signed, Buford Dresser, MD PhD 12/12/2020  [facility]

## 2020-12-12 NOTE — Assessment & Plan Note (Signed)
Sharp, electric pain on the right side near the axilla. Was shoveling for a while, caused him to drop the shovel. Lasted about 5 minutes, better with grabbing/massaging the muscle.

## 2020-12-12 NOTE — Patient Instructions (Signed)
Medication Instructions:  Your Physician recommend you continue on your current medication as directed.    *If you need a refill on your cardiac medications before your next appointment, please call your pharmacy*   Lab Work: None ordered today   Testing/Procedures: None ordered today   Follow-Up: At [facility], you and your health needs are our priority.  As part of our continuing mission to provide you with exceptional heart care, we have created designated Provider Care Teams.  These Care Teams include your primary Cardiologist (physician) and Advanced Practice Providers (APPs -  Physician Assistants and Nurse Practitioners) who all work together to provide you with the care you need, when you need it.  We recommend signing up for the patient portal called "MyChart".  Sign up information is provided on this After Visit Summary.  MyChart is used to connect with patients for Virtual Visits (Telemedicine).  Patients are able to view lab/test results, encounter notes, upcoming appointments, etc.  Non-urgent messages can be sent to your provider as well.   To learn more about what you can do with MyChart, go to [email or web address].    Your next appointment:   6 month(s) @ [address] [place], [place] [address]   The format for your next appointment:   In Person  Provider:   Bridgette Christopher, MD     

## 2020-12-13 ENCOUNTER — Ambulatory Visit (INDEPENDENT_AMBULATORY_CARE_PROVIDER_SITE_OTHER): Payer: Medicare Other | Admitting: *Deleted

## 2020-12-13 DIAGNOSIS — E538 Deficiency of other specified B group vitamins: Secondary | ICD-10-CM

## 2020-12-13 MED ORDER — CYANOCOBALAMIN 1000 MCG/ML IJ SOLN
1000.0000 ug | Freq: Once | INTRAMUSCULAR | Status: AC
  Administered 2020-12-13: 1000 ug via INTRAMUSCULAR

## 2020-12-13 NOTE — Progress Notes (Signed)
Per orders of Dr. Hernandez, injection of Cyanocobalamin 1000mcg given by Gareth Fitzner A. Patient tolerated injection well. 

## 2020-12-15 ENCOUNTER — Encounter: Admit: 2020-12-15 | Discharge: 2020-12-15 | Payer: MEDICARE

## 2020-12-15 DIAGNOSIS — L570 Actinic keratosis: Secondary | ICD-10-CM | POA: Diagnosis not present

## 2020-12-15 DIAGNOSIS — Z08 Encounter for follow-up examination after completed treatment for malignant neoplasm: Secondary | ICD-10-CM | POA: Diagnosis not present

## 2020-12-15 DIAGNOSIS — L821 Other seborrheic keratosis: Secondary | ICD-10-CM | POA: Diagnosis not present

## 2020-12-15 DIAGNOSIS — X32XXXD Exposure to sunlight, subsequent encounter: Secondary | ICD-10-CM | POA: Diagnosis not present

## 2020-12-15 DIAGNOSIS — Z85828 Personal history of other malignant neoplasm of skin: Secondary | ICD-10-CM | POA: Diagnosis not present

## 2020-12-20 ENCOUNTER — Encounter: Admit: 2020-12-20 | Discharge: 2020-12-20 | Payer: MEDICARE

## 2020-12-20 NOTE — Progress Notes
!!!  STAT!!!    Request for the following medical records, for the purpose Continuity of Care.     Please send the following:      Office Visit Notes   EKG's     Please Fax to:   FAX#: [phone]  Attn: Cordelia Pen

## 2020-12-20 NOTE — Progress Notes
Historical labs scanned in - new patient not seen yet by [facility], appt. Confirmed on 6/17. Will discuss then.

## 2020-12-21 ENCOUNTER — Other Ambulatory Visit: Payer: Self-pay

## 2020-12-22 ENCOUNTER — Ambulatory Visit (INDEPENDENT_AMBULATORY_CARE_PROVIDER_SITE_OTHER): Payer: Medicare HMO | Admitting: Internal Medicine

## 2020-12-22 ENCOUNTER — Encounter: Payer: Self-pay | Admitting: Internal Medicine

## 2020-12-22 VITALS — BP 140/80 | HR 92 | Temp 97.8°F | Wt 167.6 lb

## 2020-12-22 DIAGNOSIS — E119 Type 2 diabetes mellitus without complications: Secondary | ICD-10-CM

## 2020-12-22 DIAGNOSIS — E538 Deficiency of other specified B group vitamins: Secondary | ICD-10-CM

## 2020-12-22 DIAGNOSIS — H9190 Unspecified hearing loss, unspecified ear: Secondary | ICD-10-CM

## 2020-12-22 DIAGNOSIS — Z23 Encounter for immunization: Secondary | ICD-10-CM | POA: Diagnosis not present

## 2020-12-22 DIAGNOSIS — I700 Atherosclerosis of aorta: Secondary | ICD-10-CM

## 2020-12-22 DIAGNOSIS — E7800 Pure hypercholesterolemia, unspecified: Secondary | ICD-10-CM

## 2020-12-22 DIAGNOSIS — R030 Elevated blood-pressure reading, without diagnosis of hypertension: Secondary | ICD-10-CM | POA: Diagnosis not present

## 2020-12-22 LAB — POCT GLYCOSYLATED HEMOGLOBIN (HGB A1C): Hemoglobin A1C: 5.8 % — AB (ref 4.0–5.6)

## 2020-12-22 MED ORDER — AZELASTINE HCL 0.1 % NA SOLN: 1.0000 | Freq: Two times a day (BID) | NASAL | 2 refills | Status: DC

## 2020-12-22 NOTE — Addendum Note (Signed)
Addended by: Westley Hummer B on: 12/22/2020 10:08 AM   Modules accepted: Orders

## 2020-12-22 NOTE — Addendum Note (Signed)
Addended by: Westley Hummer B on: 12/22/2020 11:44 AM   Modules accepted: Orders

## 2020-12-22 NOTE — Progress Notes (Signed)
Established Patient Office Visit     This visit occurred during the SARS-CoV-2 public health emergency.  Safety protocols were in place, including screening questions prior to the visit, additional usage of staff PPE, and extensive cleaning of exam room while observing appropriate contact time as indicated for disinfecting solutions.    CC/Reason for Visit: 38-month follow-up chronic medical conditions  HPI: Jeff Jenkins is a 65 y.o. male who is coming in today for the above mentioned reasons. Past Medical History is significant for: Type 2 diabetes that has been well controlled, hyperlipidemia, elevated blood pressure with an element of whitecoat syndrome without a diagnosis of hypertension, coronary artery disease and GERD.  He is followed by Dr. Harrell Gave with cardiology.  He is requesting an audiology referral.  He is due for a conjugated pneumonia vaccine.  He has completed his first month of B12 supplementation.  He has been feeling well and has no acute complaints.   Past Medical/Surgical History: Past Medical History:  Diagnosis Date   Allergy    Arthritis    Broken ankle 2008   Coronary artery disease    Diverticulosis    ED (erectile dysfunction)    Facial fracture ([place]) 1979   GERD (gastroesophageal reflux disease)    Hearing loss in right ear    Hemorrhoids    Hiatal hernia    Hyperlipidemia    Hypertension    Peptic stricture of esophagus    Peptic ulcer disease 1989   Prostate cancer ([place]) 02/2009   Seasonal allergies    Type 2 diabetes mellitus ([place])     Past Surgical History:  Procedure Laterality Date   ANKLE SURGERY Left 2008   CHOLECYSTECTOMY N/A 01/31/2020   Procedure: LAPAROSCOPIC CHOLECYSTECTOMY WITH INTRAOPERATIVE CHOLANGIOGRAM;  Surgeon: Clovis Riley, MD;  Location: [facility];  Service: General;  Laterality: N/A;   FACIAL FRACTURE SURGERY     INGUINAL HERNIA REPAIR Left    KNEE ARTHROSCOPY Left 2013   KNEE ARTHROSCOPY  [id number]    [facility]   PROSTATECTOMY  2011   TONSILLECTOMY     UPPER GASTROINTESTINAL ENDOSCOPY     VASECTOMY      Social History:  reports that he has never smoked. He has never used smokeless tobacco. He reports current alcohol use of about 12.0 standard drinks of alcohol per week. He reports that he does not use drugs.  Allergies: No Known Allergies  Family History:  Family History  Problem Relation Age of Onset   Breast cancer Mother    Diabetes Mother    Heart attack Mother    Alcohol abuse Father    Stroke Brother    Colon cancer Neg Hx    Esophageal cancer Neg Hx    Rectal cancer Neg Hx    Stomach cancer Neg Hx      Current Outpatient Medications:    acetaminophen (TYLENOL) 325 MG tablet, Take 650 mg by mouth every 6 (six) hours as needed for moderate pain., Disp: , Rfl:    aspirin EC 81 MG tablet, Take 81 mg by mouth daily. Swallow whole., Disp: , Rfl:    atorvastatin (LIPITOR) 40 MG tablet, Take 1 tablet (40 mg total) by mouth daily., Disp: 90 tablet, Rfl: 1   celecoxib (CELEBREX) 200 MG capsule, Take 200 mg by mouth daily., Disp: , Rfl:    diclofenac Sodium (VOLTAREN) 1 % GEL, Apply 2 g topically in the morning, at noon, and at bedtime., Disp: , Rfl:  esomeprazole (NEXIUM) 40 MG capsule, TAKE 1 CAPSULE DAILY BEFORE BREAKFAST., Disp: 90 capsule, Rfl: 3   ezetimibe (ZETIA) 10 MG tablet, Take 1 tablet (10 mg total) by mouth daily., Disp: 90 tablet, Rfl: 1   fexofenadine (ALLEGRA) 180 MG tablet, Take 180 mg by mouth daily., Disp: , Rfl:    HYDROcodone-acetaminophen (NORCO) 7.5-325 MG tablet, Take 1-2 tablets by mouth every 4 (four) hours as needed., Disp: , Rfl:    Lancets (ONETOUCH ULTRASOFT) lancets, 1 each by Other route daily. Dx E11.9, Disp: 100 each, Rfl: 3   lisinopril (ZESTRIL) 5 MG tablet, Take 1 tablet (5 mg total) by mouth daily., Disp: 90 tablet, Rfl: 1   methocarbamol (ROBAXIN) 500 MG tablet, Take 500 mg by mouth every 6 (six) hours as needed., Disp: , Rfl:     ONETOUCH ULTRA test strip, CHECK BLOOD SUGAR DAILY, Disp: 100 strip, Rfl: 3   sildenafil (VIAGRA) 100 MG tablet, TAKE 1 TABLET BY MOUTH DAILY AS NEEDED, Disp: 10 tablet, Rfl: 11   SYNJARDY XR 25-1000 MG TB24, Take 1 tablet by mouth daily., Disp: 30 tablet, Rfl: 5   tadalafil (CIALIS) 5 MG tablet, Take 5 mg by mouth daily as needed for erectile dysfunction. , Disp: , Rfl:    Testosterone 30 MG/ACT SOLN, Place onto the skin daily., Disp: , Rfl:    azelastine (ASTELIN) 0.1 % nasal spray, Place 1 spray into both nostrils 2 (two) times daily. Use in each nostril as directed, Disp: 30 mL, Rfl: 2  Review of Systems:  Constitutional: Denies fever, chills, diaphoresis, appetite change and fatigue.  HEENT: Denies photophobia, eye pain, redness,  ear pain, congestion, sore throat, rhinorrhea, sneezing, mouth sores, trouble swallowing, neck pain, neck stiffness and tinnitus.   Respiratory: Denies SOB, DOE, cough, chest tightness,  and wheezing.   Cardiovascular: Denies chest pain, palpitations and leg swelling.  Gastrointestinal: Denies nausea, vomiting, abdominal pain, diarrhea, constipation, blood in stool and abdominal distention.  Genitourinary: Denies dysuria, urgency, frequency, hematuria, flank pain and difficulty urinating.  Endocrine: Denies: hot or cold intolerance, sweats, changes in hair or nails, polyuria, polydipsia. Musculoskeletal: Denies myalgias, back pain, joint swelling, arthralgias and gait problem.  Skin: Denies pallor, rash and wound.  Neurological: Denies dizziness, seizures, syncope, weakness, light-headedness, numbness and headaches.  Hematological: Denies adenopathy. Easy bruising, personal or family bleeding history  Psychiatric/Behavioral: Denies suicidal ideation, mood changes, confusion, nervousness, sleep disturbance and agitation    Physical Exam: Vitals:   12/22/20 0940  BP: 140/80  Pulse: 92  Temp: 97.8 F (36.6 C)  TempSrc: Oral  SpO2: 97%  Weight: 167 lb 9.6  oz (76 kg)    Body mass index is 22.73 kg/m.   Constitutional: NAD, calm, comfortable Eyes: PERRL, lids and conjunctivae normal ENMT: Mucous membranes are moist.  Respiratory: clear to auscultation bilaterally, no wheezing, no crackles. Normal respiratory effort. No accessory muscle use.  Cardiovascular: Regular rate and rhythm, no murmurs / rubs / gallops. No extremity edema.  Neurologic: Grossly intact and nonfocal Psychiatric: Normal judgment and insight. Alert and oriented x 3. Normal mood.    Impression and Plan:  Type 2 diabetes mellitus without obesity ([place])  -Excellent control today with an in office A1c of 5.8.  Follow-up in 3 months.  Hearing loss, unspecified hearing loss type, unspecified laterality  - Plan: Ambulatory referral to Audiology  Pure hypercholesterolemia -Last lipid panel in December 2021 with total cholesterol 139, triglycerides 57 and LDL 53. -He is currently on atorvastatin 40 mg daily.  Elevated blood pressure reading in office with white coat syndrome, without diagnosis of hypertension -In office blood pressure today 140/80, states home measurements are around 591-3 10 systolic with diastolics in the 68Z to low 80s.  He will bring ambulatory measurements next visit.  B12 deficiency -He will continue with monthly IM B12 supplementation.  Need for vaccination against Streptococcus pneumoniae -PCV 13 administered today.  Aortic atherosclerosis -Continue statin therapy.   Patient Instructions  -Nice seeing you today!!  -Remember to check your BP 2-3 times a week and bring in measurements to your next visit.  -Schedule follow up in 3 months.    Lelon Frohlich, MD [facility]

## 2020-12-22 NOTE — Patient Instructions (Signed)
-  Nice seeing you today!!  -Remember to check your BP 2-3 times a week and bring in measurements to your next visit.  -Schedule follow up in 3 months.

## 2020-12-26 ENCOUNTER — Encounter: Admit: 2020-12-26 | Discharge: 2020-12-26 | Payer: MEDICARE

## 2020-12-26 DIAGNOSIS — S4991XA Unspecified injury of right shoulder and upper arm, initial encounter: Secondary | ICD-10-CM

## 2020-12-26 DIAGNOSIS — R002 Palpitations: Secondary | ICD-10-CM

## 2020-12-26 DIAGNOSIS — I10 Essential (primary) hypertension: Secondary | ICD-10-CM

## 2020-12-26 DIAGNOSIS — G4733 Obstructive sleep apnea (adult) (pediatric): Secondary | ICD-10-CM

## 2020-12-26 DIAGNOSIS — I480 Paroxysmal atrial fibrillation: Secondary | ICD-10-CM

## 2020-12-26 DIAGNOSIS — K219 Gastro-esophageal reflux disease without esophagitis: Secondary | ICD-10-CM

## 2020-12-26 DIAGNOSIS — I4891 Unspecified atrial fibrillation: Secondary | ICD-10-CM

## 2020-12-29 ENCOUNTER — Encounter: Admit: 2020-12-29 | Discharge: 2020-12-29 | Payer: MEDICARE

## 2021-01-01 ENCOUNTER — Encounter: Admit: 2021-01-01 | Discharge: 2021-01-01 | Payer: MEDICARE

## 2021-01-01 DIAGNOSIS — I4891 Unspecified atrial fibrillation: Secondary | ICD-10-CM

## 2021-01-01 DIAGNOSIS — I480 Paroxysmal atrial fibrillation: Secondary | ICD-10-CM

## 2021-01-01 MED ORDER — SODIUM CHLORIDE 0.9 % IV SOLP
INTRAVENOUS | 0 refills
Start: 2021-01-01 — End: ?

## 2021-01-01 MED ORDER — PANTOPRAZOLE 40 MG PO TBEC
40 mg | ORAL_TABLET | Freq: Two times a day (BID) | ORAL | 0 refills | 90.00000 days | Status: AC
Start: 2021-01-01 — End: ?

## 2021-01-01 MED ORDER — LIDOCAINE (PF) 10 MG/ML (1 %) IJ SOLN
.2 mL | INTRAMUSCULAR | 0 refills | PRN
Start: 2021-01-01 — End: ?

## 2021-01-01 MED ORDER — LIDOCAINE HCL 2 % MM JELP
Freq: Once | TOPICAL | 0 refills
Start: 2021-01-01 — End: ?

## 2021-01-01 NOTE — Telephone Encounter
Called pt to discuss Afib ablation dates. Spoke with pt and his wife. They choose date of 04/19/21 due to their son getting married in mid-late September. Planning for pre-procedure appts on 04/18/21 so they do not have to make multiple trips to [facility].    Will mail out instructions.     Reviewed plan with the patient. Patient verbalized understanding and does not have any further questions or concerns. No further education requested from patient. Patient has our contact information for future needs.

## 2021-01-01 NOTE — Telephone Encounter
-----   Message from Lesli Albee, RN sent at 01/01/2021  2:13 PM CDT -----  Regarding: CCTA Appointment Request  Hello,    Can you please schedule patient for a pre-ablation CCTA on 04/18/21 at 2 PM after his PAT visit? He lives about 4 hours away, so the earliest they want to arrive to [facility] is 11 AM and he is scheduled to see Graceann at 11:30 AM and then PAT to follow at 12:30 PM, so planning fr CCTA after that around 2 PM if possible?    Thanks!  Cloria Spring, RN

## 2021-01-08 DIAGNOSIS — H903 Sensorineural hearing loss, bilateral: Secondary | ICD-10-CM | POA: Diagnosis not present

## 2021-01-08 DIAGNOSIS — H9313 Tinnitus, bilateral: Secondary | ICD-10-CM | POA: Diagnosis not present

## 2021-01-16 ENCOUNTER — Ambulatory Visit (INDEPENDENT_AMBULATORY_CARE_PROVIDER_SITE_OTHER): Payer: Medicare Other

## 2021-01-16 DIAGNOSIS — E7800 Pure hypercholesterolemia, unspecified: Secondary | ICD-10-CM

## 2021-01-16 DIAGNOSIS — E119 Type 2 diabetes mellitus without complications: Secondary | ICD-10-CM | POA: Diagnosis not present

## 2021-01-16 DIAGNOSIS — R030 Elevated blood-pressure reading, without diagnosis of hypertension: Secondary | ICD-10-CM

## 2021-01-16 NOTE — Chronic Care Management (AMB) (Signed)
Chronic Care Management   CCM RN Visit Note  01/16/2021 Name: Jeff Jenkins MRN: [id number] DOB: 1956/03/07  Subjective: Jeff Jenkins is a 65 y.o. year old male who is a primary care patient of Isaac Bliss, Rayford Halsted, MD. The care management team was consulted for assistance with disease management and care coordination needs.    Engaged with patient by telephone for follow up visit in response to provider referral for case management and/or care coordination services.   Consent to Services:  The patient was given information about Chronic Care Management services, agreed to services, and gave verbal consent prior to initiation of services.  Please see initial visit note for detailed documentation.   Patient agreed to services and verbal consent obtained.   Assessment: Review of patient past medical history, allergies, medications, health status, including review of consultants reports, laboratory and other test data, was performed as part of comprehensive evaluation and provision of chronic care management services.   SDOH (Social Determinants of Health) assessments and interventions performed:    CCM Care Plan  No Known Allergies  Outpatient Encounter Medications as of 01/16/2021  Medication Sig   acetaminophen (TYLENOL) 325 MG tablet Take 650 mg by mouth every 6 (six) hours as needed for moderate pain.   aspirin EC 81 MG tablet Take 81 mg by mouth daily. Swallow whole.   atorvastatin (LIPITOR) 40 MG tablet Take 1 tablet (40 mg total) by mouth daily.   azelastine (ASTELIN) 0.1 % nasal spray Place 1 spray into both nostrils 2 (two) times daily. Use in each nostril as directed   celecoxib (CELEBREX) 200 MG capsule Take 200 mg by mouth daily.   diclofenac Sodium (VOLTAREN) 1 % GEL Apply 2 g topically in the morning, at noon, and at bedtime.   esomeprazole (NEXIUM) 40 MG capsule TAKE 1 CAPSULE DAILY BEFORE BREAKFAST.   ezetimibe (ZETIA) 10 MG tablet Take 1 tablet (10 mg  total) by mouth daily.   fexofenadine (ALLEGRA) 180 MG tablet Take 180 mg by mouth daily.   HYDROcodone-acetaminophen (NORCO) 7.5-325 MG tablet Take 1-2 tablets by mouth every 4 (four) hours as needed.   Lancets (ONETOUCH ULTRASOFT) lancets 1 each by Other route daily. Dx E11.9   lisinopril (ZESTRIL) 5 MG tablet Take 1 tablet (5 mg total) by mouth daily.   methocarbamol (ROBAXIN) 500 MG tablet Take 500 mg by mouth every 6 (six) hours as needed.   ONETOUCH ULTRA test strip CHECK BLOOD SUGAR DAILY   sildenafil (VIAGRA) 100 MG tablet TAKE 1 TABLET BY MOUTH DAILY AS NEEDED   SYNJARDY XR 25-1000 MG TB24 Take 1 tablet by mouth daily.   tadalafil (CIALIS) 5 MG tablet Take 5 mg by mouth daily as needed for erectile dysfunction.    Testosterone 30 MG/ACT SOLN Place onto the skin daily.   No facility-administered encounter medications on file as of 01/16/2021.    Patient Active Problem List   Diagnosis Date Noted   Chest pain of uncertain etiology 32/06/2481   Prostate cancer ([place]) 09/20/2020   Erectile dysfunction 09/15/2019   Coronary artery disease due to lipid rich plaque 05/20/2019   Coronary artery calcification seen on CT scan 05/20/2019   Type 2 diabetes mellitus without obesity ([place]) 03/25/2019   PVC's (premature ventricular contractions) 03/25/2019   Elevated blood pressure reading in office with white coat syndrome, without diagnosis of hypertension 03/25/2019   Pure hypercholesterolemia 03/25/2019   History of esophageal stricture 02/01/2015   Special screening for malignant neoplasms, colon  10/15/2010   HEMORRHOIDS-EXTERNAL 09/15/2008   HEMORRHOIDS 09/15/2008   GERD 09/15/2008    Conditions to be addressed/monitored:HTN, HLD, and DMII  Care Plan : RNCM:Diabetes Type 2 (Adult)  Updates made by Dimitri Ped, RN since 01/16/2021 12:00 AM     Problem: Glycemic Management (Diabetes, Type 2)   Priority: High     Long-Range Goal: Glycemic Management Optimized   Start Date:  10/03/2020  Expected End Date: 05/14/2021  This Visit's Progress: On track  Recent Progress: On track  Priority: High  Note:   Objective:  Lab Results  Component Value Date   HGBA1C 6.1 (A) 09/20/2020   Lab Results  Component Value Date   CREATININE 0.74 06/22/2020   CREATININE 0.74 01/27/2020   CREATININE 0.78 04/28/2019   No results found for: EGFR Current Barriers:  Knowledge Deficits related to basic Diabetes pathophysiology and self care/management in pt with other chronic conditions of HLD and HTN Knowledge Deficits related to medications used for management of diabetes Does not adhere to provider recommendations re: diet and exercise, reports CBG checks once a week range from 117-132 fasting in AM, denies any low readings, states he has been trying to eat healthy, reports doing yard work and helping his son farm for exercise, states he Hemoglobin A1C was down to 5.8% when he saw his doctor on 12/22/20 Case Manager Clinical Goal(s):  patient will demonstrate improved adherence to prescribed treatment plan for diabetes self care/management as evidenced by: daily monitoring and recording of CBG  adherence to ADA/ carb modified diet exercise 3 days/week adherence to prescribed medication regimen contacting provider for new or worsened symptoms or questions Interventions:  Collaboration with Isaac Bliss, Rayford Halsted, MD regarding development and update of comprehensive plan of care as evidenced by provider attestation and co-signature Inter-disciplinary care team collaboration (see longitudinal plan of care) Reviewed education to patient about basic DM disease process Reviewed medications with patient and discussed importance of medication adherence Discussed plans with patient for ongoing care management follow up and provided patient with direct contact information for care management team Reviewed scheduled/upcoming provider appointments including:Dr. Jerilee Hoh 7/622, 02/19/21 B-12  injection, Dr. Jerilee Hoh 03/27/21 Reinforced to check cbg 3-4 times a week and record, calling primary care  provider  for findings outside established parameters.   Review of patient status, including review of consultants reports, relevant laboratory and other test results, and medications completed. Reinforced s/sx of hypoglycemia and the rule of 15 to treat hypoglycemia Reinforced to continue to try limiting drinking sweet tea and concentrated sweets Reviewed importance of drinking adequate amounts of fluids when working or exercising outside in the heat especially while taking Synjardy  Self-Care Activities - Self administers oral medications as prescribed Attends all scheduled provider appointments Checks blood sugars as prescribed and utilize hyper and hypoglycemia protocol as needed Adheres to prescribed ADA/carb modified Patient Goals: - check blood sugar at prescribed times - check blood sugar if I feel it is too high or too low - enter blood sugar readings and medication or insulin into daily log - take the blood sugar log to all doctor visits - change to whole grain breads, cereal, pasta - drink 6 to 8 glasses of water each day - fill half of plate with vegetables - read food labels for fat, fiber, carbohydrates and portion size - switch to sugar-free drinks - keep appointment with eye doctor - check feet daily for cuts, sores or redness -continue to try drinking half and half sweet tea 4  times a week instead of sweet tea Follow Up Plan: Telephone follow up appointment with care management team member scheduled for: 04/02/21 at 9:30 AM The patient has been provided with contact information for the care management team and has been advised to call with any health related questions or concerns.      Care Plan : RNCM:Hypertension (Adult)  Updates made by Dimitri Ped, RN since 01/16/2021 12:00 AM     Problem: Hypertension (Hypertension)   Priority: Medium     Long-Range  Goal: Hypertension Monitored   Start Date: 10/03/2020  Expected End Date: 05/14/2021  This Visit's Progress: On track  Recent Progress: On track  Priority: Medium  Note:   Objective:  Last practice recorded BP readings:  BP Readings from Last 3 Encounters:  09/20/20 140/80  08/30/20 130/80  08/21/20 133/83   Most recent eGFR/CrCl: No results found for: EGFR  No components found for: CRCL Current Barriers:  Knowledge Deficits related to basic understanding of hypertension pathophysiology and self care management Knowledge Deficits related to understanding of medications prescribed for management of hypertension Does not adhere to provider recommendations re: diet eats out frequently Reports B/P was 117/73 last night when checking at home, reports he checks 2-3 times a week in the evening when he is relaxed, reports he does yard work and helps his son farm for exercise, reports trying to follow a low sodium diet most of the time, states he saw his cardiologist at the end of May with a good check up, denies any chest pains or shortness or breath Case Manager Clinical Goal(s):  patient will verbalize understanding of plan for hypertension management patient will attend all scheduled medical appointments: 03/27/21 Dr. Jerilee Hoh 01/17/21, 8/8/22B-12 injection patient will demonstrate improved health management independence as evidenced by checking blood pressure as directed and notifying PCP if SBP>160 or DBP > 90, taking all medications as prescribe, and adhering to a low sodium diet as discussed. Interventions:  Collaboration with Isaac Bliss, Rayford Halsted, MD regarding development and update of comprehensive plan of care as evidenced by provider attestation and co-signature Inter-disciplinary care team collaboration (see longitudinal plan of care) Evaluation of current treatment plan related to hypertension self management and patient's adherence to plan as established by provider. Reinforced  education to patient on stroke prevention, s/s of heart attack and stroke, DASH diet, complications of uncontrolled blood pressure Reviewed medications with patient and discussed importance of compliance Discussed plans with patient for ongoing care management follow up and provided patient with direct contact information for care management team Reinforced to monitor blood pressure daily and record, calling PCP for findings outside established parameters.  Encouraged to try checking B/P at different times  Reviewed scheduled/upcoming provider appointments including: 03/27/21 Dr. Jerilee Hoh 01/17/21,02/19/21 B-12 injection Self-Care Activities: - Self administers medications as prescribed Attends all scheduled provider appointments Calls provider office for new concerns, questions, or BP outside discussed parameters Checks BP and records as discussed Follows a low sodium diet/DASH diet Patient Goals: - check blood pressure daily - choose a place to take my blood pressure (home, clinic or office, retail store) - write blood pressure results in a log or diary - ask questions to understand - learn about high blood pressure -take B/P log to provider visits Follow Up Plan: Telephone follow up appointment with care management team member scheduled for: 04/02/21 at 9:30 AM The patient has been provided with contact information for the care management team and has been advised to call with any  health related questions or concerns.       Plan:Telephone follow up appointment with care management team member scheduled for:  04/02/21 and The patient has been provided with contact information for the care management team and has been advised to call with any health related questions or concerns.  Peter Garter RN, Jackquline Denmark, CDE Care Management Coordinator [facility] [phone], Mobile [phone]

## 2021-01-16 NOTE — Patient Instructions (Signed)
Visit Information  PATIENT GOALS:  Goals Addressed             This Visit's Progress    Monitor and Manage My Blood Sugar-Diabetes Type 2   On track    Timeframe:  Long-Range Goal Priority:  High Start Date:       10/03/20                      Expected End Date:           05/14/21            Follow Up Date 04/02/21   - check blood sugar at prescribed times - check blood sugar if I feel it is too high or too low - enter blood sugar readings and medication or insulin into daily log - take the blood sugar log to all doctor visits  -continue to try drinking half and half sweet tea 4 times a week instead of sweet tea   Why is this important?   Checking your blood sugar at home helps to keep it from getting very high or very low.  Writing the results in a diary or log helps the doctor know how to care for you.  Your blood sugar log should have the time, date and the results.  Also, write down the amount of insulin or other medicine that you take.  Other information, like what you ate, exercise done and how you were feeling, will also be helpful.     Notes:       Track and Manage My Blood Pressure-Hypertension   On track    Timeframe:  Long-Range Goal Priority:  Medium Start Date:      10/03/20                       Expected End Date:     05/14/21                 Follow Up Date 04/02/2021    - check blood pressure daily - choose a place to take my blood pressure (home, clinic or office, retail store) - write blood pressure results in a log or diary  -take blood pressure log to provider visits   Why is this important?   You won't feel high blood pressure, but it can still hurt your blood vessels.  High blood pressure can cause heart or kidney problems. It can also cause a stroke.  Making lifestyle changes like losing a little weight or eating less salt will help.  Checking your blood pressure at home and at different times of the day can help to control blood pressure.  If the  doctor prescribes medicine remember to take it the way the doctor ordered.  Call the office if you cannot afford the medicine or if there are questions about it.     Notes:          Patient verbalizes understanding of instructions provided today and agrees to view in [place].   Telephone follow up appointment with care management team member scheduled for:  Peter Garter RN, [facility], CDE Care Management Coordinator [place] [phone], Mobile [phone]

## 2021-01-17 ENCOUNTER — Ambulatory Visit (INDEPENDENT_AMBULATORY_CARE_PROVIDER_SITE_OTHER): Payer: Medicare Other | Admitting: *Deleted

## 2021-01-17 ENCOUNTER — Other Ambulatory Visit: Payer: Self-pay

## 2021-01-17 DIAGNOSIS — E538 Deficiency of other specified B group vitamins: Secondary | ICD-10-CM | POA: Diagnosis not present

## 2021-01-17 MED ORDER — CYANOCOBALAMIN 1000 MCG/ML IJ SOLN
1000.0000 ug | Freq: Once | INTRAMUSCULAR | Status: AC
  Administered 2021-01-17: 1000 ug via INTRAMUSCULAR

## 2021-01-17 NOTE — Progress Notes (Signed)
Per orders of Dr. Fry, injection of B12 given by Evangelyne Loja. Patient tolerated injection well. 

## 2021-01-24 DIAGNOSIS — E230 Hypopituitarism: Secondary | ICD-10-CM | POA: Diagnosis not present

## 2021-01-24 DIAGNOSIS — Z8546 Personal history of malignant neoplasm of prostate: Secondary | ICD-10-CM | POA: Diagnosis not present

## 2021-01-24 LAB — TESTOSTERONE: Testosterone: 317.2

## 2021-01-24 LAB — PSA: PSA: <0.015

## 2021-01-29 ENCOUNTER — Encounter: Payer: Self-pay | Admitting: Internal Medicine

## 2021-02-05 DIAGNOSIS — N393 Stress incontinence (female) (male): Secondary | ICD-10-CM | POA: Diagnosis not present

## 2021-02-05 DIAGNOSIS — N5231 Erectile dysfunction following radical prostatectomy: Secondary | ICD-10-CM | POA: Diagnosis not present

## 2021-02-05 DIAGNOSIS — Z8546 Personal history of malignant neoplasm of prostate: Secondary | ICD-10-CM | POA: Diagnosis not present

## 2021-02-05 DIAGNOSIS — E230 Hypopituitarism: Secondary | ICD-10-CM | POA: Diagnosis not present

## 2021-02-06 NOTE — Telephone Encounter (Signed)
Patient is scheduled for a CPE in November per patient wanted appointment when she was due for a CPE.

## 2021-02-08 ENCOUNTER — Encounter: Payer: Self-pay | Admitting: Internal Medicine

## 2021-02-19 ENCOUNTER — Ambulatory Visit (INDEPENDENT_AMBULATORY_CARE_PROVIDER_SITE_OTHER): Payer: Medicare Other

## 2021-02-19 ENCOUNTER — Ambulatory Visit: Payer: Medicare HMO

## 2021-02-19 ENCOUNTER — Other Ambulatory Visit: Payer: Self-pay

## 2021-02-19 DIAGNOSIS — E538 Deficiency of other specified B group vitamins: Secondary | ICD-10-CM | POA: Diagnosis not present

## 2021-02-19 MED ORDER — CYANOCOBALAMIN 1000 MCG/ML IJ SOLN
1000.0000 ug | Freq: Once | INTRAMUSCULAR | Status: AC
  Administered 2021-02-19: 1000 ug via INTRAMUSCULAR

## 2021-02-19 NOTE — Progress Notes (Signed)
Per orders of Dr. Fry, injection of Cyanocobalamin 1000 mcg given by Foy Mungia L Remi Lopata. °Patient tolerated injection well.  °

## 2021-03-13 ENCOUNTER — Encounter: Admit: 2021-03-13 | Discharge: 2021-03-13 | Payer: MEDICARE

## 2021-03-13 NOTE — Progress Notes
Medicare Primary No pre-certification is required.

## 2021-03-27 ENCOUNTER — Other Ambulatory Visit: Payer: Self-pay

## 2021-03-27 ENCOUNTER — Ambulatory Visit (INDEPENDENT_AMBULATORY_CARE_PROVIDER_SITE_OTHER): Payer: Medicare Other | Admitting: Internal Medicine

## 2021-03-27 ENCOUNTER — Encounter: Payer: Self-pay | Admitting: Internal Medicine

## 2021-03-27 VITALS — BP 140/80 | HR 88 | Temp 98.2°F | Wt 168.6 lb

## 2021-03-27 DIAGNOSIS — E7800 Pure hypercholesterolemia, unspecified: Secondary | ICD-10-CM

## 2021-03-27 DIAGNOSIS — E119 Type 2 diabetes mellitus without complications: Secondary | ICD-10-CM

## 2021-03-27 DIAGNOSIS — I2510 Atherosclerotic heart disease of native coronary artery without angina pectoris: Secondary | ICD-10-CM | POA: Diagnosis not present

## 2021-03-27 DIAGNOSIS — Z23 Encounter for immunization: Secondary | ICD-10-CM

## 2021-03-27 DIAGNOSIS — R030 Elevated blood-pressure reading, without diagnosis of hypertension: Secondary | ICD-10-CM

## 2021-03-27 DIAGNOSIS — E538 Deficiency of other specified B group vitamins: Secondary | ICD-10-CM | POA: Diagnosis not present

## 2021-03-27 DIAGNOSIS — I2583 Coronary atherosclerosis due to lipid rich plaque: Secondary | ICD-10-CM | POA: Diagnosis not present

## 2021-03-27 LAB — VITAMIN B12: Vitamin B-12: >1550 pg/mL — ABNORMAL HIGH (ref 211–911)

## 2021-03-27 LAB — POCT GLYCOSYLATED HEMOGLOBIN (HGB A1C): Hemoglobin A1C: 5.9 % — AB (ref 4.0–5.6)

## 2021-03-27 MED ORDER — CYANOCOBALAMIN 1000 MCG/ML IJ SOLN
1000.0000 ug | Freq: Once | INTRAMUSCULAR | Status: AC
  Administered 2021-03-27: 1000 ug via INTRAMUSCULAR

## 2021-03-27 NOTE — Progress Notes (Signed)
Established Patient Office Visit     This visit occurred during the SARS-CoV-2 public health emergency.  Safety protocols were in place, including screening questions prior to the visit, additional usage of staff PPE, and extensive cleaning of exam room while observing appropriate contact time as indicated for disinfecting solutions.    CC/Reason for Visit: 53-monthfollow-up chronic medical conditions  HPI: Jeff Kauppilais a 65y.o. male who is coming in today for the above mentioned reasons. Past Medical History is significant for: Type 2 diabetes that has been well controlled, hyperlipidemia, elevated blood pressure with an element of whitecoat syndrome without a diagnosis of hypertension, coronary artery disease and GERD.  He is followed by Dr. CHarrell Gavewith cardiology.  He is requesting flu vaccine as well as his monthly IM B12 injection today.  He would like to also have his B12 levels checked.  He has been noted to have elevated blood pressure in office several times in the recent past.  He brings in his ambulatory log as follows:  115/75 107/68 124/75 105/70 121/78 115/73 96/65 112/77 116/69 128/77  Past Medical/Surgical History: Past Medical History:  Diagnosis Date   Allergy    Arthritis    Broken ankle 2008   Coronary artery disease    Diverticulosis    ED (erectile dysfunction)    Facial fracture ([place] 1979   GERD (gastroesophageal reflux disease)    Hearing loss in right ear    Hemorrhoids    Hiatal hernia    Hyperlipidemia    Hypertension    Peptic stricture of esophagus    Peptic ulcer disease 1989   Prostate cancer ([place] 02/2009   Seasonal allergies    Type 2 diabetes mellitus [place]     Past Surgical History:  Procedure Laterality Date   ANKLE SURGERY Left 2008   CHOLECYSTECTOMY N/A 01/31/2020   Procedure: LAPAROSCOPIC CHOLECYSTECTOMY WITH INTRAOPERATIVE CHOLANGIOGRAM;  Surgeon: CClovis Riley MD;  Location: [facility];  Service:  General;  Laterality: N/A;   FACIAL FRACTURE SURGERY     INGUINAL HERNIA REPAIR Left    KNEE ARTHROSCOPY Left 2013   KNEE ARTHROSCOPY  [id number]  [facility]   PROSTATECTOMY  2011   TONSILLECTOMY     UPPER GASTROINTESTINAL ENDOSCOPY     VASECTOMY      Social History:  reports that he has never smoked. He has never used smokeless tobacco. He reports current alcohol use of about 12.0 standard drinks per week. He reports that he does not use drugs.  Allergies: No Known Allergies  Family History:  Family History  Problem Relation Age of Onset   Breast cancer Mother    Diabetes Mother    Heart attack Mother    Alcohol abuse Father    Stroke Brother    Colon cancer Neg Hx    Esophageal cancer Neg Hx    Rectal cancer Neg Hx    Stomach cancer Neg Hx      Current Outpatient Medications:    acetaminophen (TYLENOL) 325 MG tablet, Take 650 mg by mouth every 6 (six) hours as needed for moderate pain., Disp: , Rfl:    aspirin EC 81 MG tablet, Take 81 mg by mouth daily. Swallow whole., Disp: , Rfl:    atorvastatin (LIPITOR) 40 MG tablet, Take 1 tablet (40 mg total) by mouth daily., Disp: 90 tablet, Rfl: 1   azelastine (ASTELIN) 0.1 % nasal spray, Place 1 spray into both nostrils 2 (two) times  daily. Use in each nostril as directed, Disp: 30 mL, Rfl: 2   celecoxib (CELEBREX) 200 MG capsule, Take 200 mg by mouth daily., Disp: , Rfl:    esomeprazole (NEXIUM) 40 MG capsule, TAKE 1 CAPSULE DAILY BEFORE BREAKFAST., Disp: 90 capsule, Rfl: 3   ezetimibe (ZETIA) 10 MG tablet, Take 1 tablet (10 mg total) by mouth daily., Disp: 90 tablet, Rfl: 1   fexofenadine (ALLEGRA) 180 MG tablet, Take 180 mg by mouth daily., Disp: , Rfl:    HYDROcodone-acetaminophen (NORCO) 7.5-325 MG tablet, Take 1-2 tablets by mouth every 4 (four) hours as needed., Disp: , Rfl:    Lancets (ONETOUCH ULTRASOFT) lancets, 1 each by Other route daily. Dx E11.9, Disp: 100 each, Rfl: 3   lisinopril (ZESTRIL) 5 MG tablet, Take 1  tablet (5 mg total) by mouth daily., Disp: 90 tablet, Rfl: 1   methocarbamol (ROBAXIN) 500 MG tablet, Take 500 mg by mouth every 6 (six) hours as needed., Disp: , Rfl:    ONETOUCH ULTRA test strip, CHECK BLOOD SUGAR DAILY, Disp: 100 strip, Rfl: 3   sildenafil (VIAGRA) 100 MG tablet, TAKE 1 TABLET BY MOUTH DAILY AS NEEDED, Disp: 10 tablet, Rfl: 11   SYNJARDY XR 25-1000 MG TB24, Take 1 tablet by mouth daily., Disp: 30 tablet, Rfl: 5   tadalafil (CIALIS) 5 MG tablet, Take 5 mg by mouth daily as needed for erectile dysfunction. , Disp: , Rfl:    Testosterone 30 MG/ACT SOLN, Place onto the skin daily., Disp: , Rfl:   Review of Systems:  Constitutional: Denies fever, chills, diaphoresis, appetite change and fatigue.  HEENT: Denies photophobia, eye pain, redness, hearing loss, ear pain, congestion, sore throat, rhinorrhea, sneezing, mouth sores, trouble swallowing, neck pain, neck stiffness and tinnitus.   Respiratory: Denies SOB, DOE, cough, chest tightness,  and wheezing.   Cardiovascular: Denies chest pain, palpitations and leg swelling.  Gastrointestinal: Denies nausea, vomiting, abdominal pain, diarrhea, constipation, blood in stool and abdominal distention.  Genitourinary: Denies dysuria, urgency, frequency, hematuria, flank pain and difficulty urinating.  Endocrine: Denies: hot or cold intolerance, sweats, changes in hair or nails, polyuria, polydipsia. Musculoskeletal: Denies myalgias, back pain, joint swelling, arthralgias and gait problem.  Skin: Denies pallor, rash and wound.  Neurological: Denies dizziness, seizures, syncope, weakness, light-headedness, numbness and headaches.  Hematological: Denies adenopathy. Easy bruising, personal or family bleeding history  Psychiatric/Behavioral: Denies suicidal ideation, mood changes, confusion, nervousness, sleep disturbance and agitation    Physical Exam: Vitals:   03/27/21 1148  BP: 140/80  Pulse: 88  Temp: 98.2 F (36.8 C)  TempSrc:  Oral  SpO2: 99%  Weight: 168 lb 9.6 oz (76.5 kg)    Body mass index is 22.87 kg/m.   Constitutional: NAD, calm, comfortable Eyes: PERRL, lids and conjunctivae normal ENMT: Mucous membranes are moist.  Respiratory: clear to auscultation bilaterally, no wheezing, no crackles. Normal respiratory effort. No accessory muscle use.  Cardiovascular: Regular rate and rhythm, no murmurs / rubs / gallops. No extremity edema.  Neurologic: Grossly intact and nonfocal Psychiatric: Normal judgment and insight. Alert and oriented x 3. Normal mood.    Impression and Plan:  Type 2 diabetes mellitus without obesity ([place])  - Plan: POCT glycosylated hemoglobin (Hb A1C) -A1c demonstrates excellent control at 5.9.  Pure hypercholesterolemia -Last lipid panel in December 2021 with a total cholesterol of 139, triglycerides 57 and LDL 53.  He is currently on atorvastatin 40 mg and ezetimibe 10 mg.  Elevated blood pressure reading in office with white  coat syndrome, without diagnosis of hypertension -Ambulatory blood pressures are well within normal range.  Coronary artery disease involving native coronary artery of native heart without angina pectoris -Stable, no chest pain, followed by cardiology.  B12 deficiency  - Plan: Vitamin B12 -B12 IM injection today.  Need for influenza vaccination -Flu vaccine given today  Time spent: 33 minutes reviewing chart, interviewing and examining patient and formulating plan of care.    Lelon Frohlich, MD [facility]

## 2021-03-27 NOTE — Addendum Note (Signed)
Addended by: Amanda Cockayne on: 03/27/2021 01:29 PM   Modules accepted: Orders

## 2021-03-28 ENCOUNTER — Other Ambulatory Visit: Payer: Self-pay | Admitting: Internal Medicine

## 2021-03-28 ENCOUNTER — Telehealth: Payer: Self-pay | Admitting: Pharmacist

## 2021-03-28 DIAGNOSIS — E538 Deficiency of other specified B group vitamins: Secondary | ICD-10-CM

## 2021-03-28 DIAGNOSIS — E119 Type 2 diabetes mellitus without complications: Secondary | ICD-10-CM

## 2021-03-28 NOTE — Telephone Encounter (Signed)
Called patient per request from PCP about applying for patient assistance for [facility] as patient is in the donut hole. Left a VM for patient to call back.

## 2021-03-28 NOTE — Telephone Encounter (Signed)
Called patient back to inquire about eligbility for patient assistance. Patient confirmed it is just him and wife in the household and they get social security and the wife has a small pension. He is unsure of the amounts. He will call back tomorrow with the information and will see if he qualifies then.

## 2021-03-28 NOTE — Telephone Encounter (Signed)
Patient called back to reach [facility]. I let patient know she was with a patient and she would return his call   Good callback number is [phone]   Please Advise

## 2021-03-29 ENCOUNTER — Other Ambulatory Visit: Payer: Self-pay | Admitting: Internal Medicine

## 2021-03-29 DIAGNOSIS — E7800 Pure hypercholesterolemia, unspecified: Secondary | ICD-10-CM

## 2021-03-29 NOTE — Telephone Encounter (Signed)
Patient called back and provided income information. Patient does not qualify for patient assistance. Set aside 1 month of samples of Jardiance 25 mg for patient to pick up.   Will route to PCP to make her aware and to request a separate rx for metformin to be sent to the pharmacy.

## 2021-03-30 ENCOUNTER — Other Ambulatory Visit: Payer: Self-pay | Admitting: Internal Medicine

## 2021-03-30 DIAGNOSIS — E119 Type 2 diabetes mellitus without complications: Secondary | ICD-10-CM

## 2021-03-30 MED ORDER — METFORMIN HCL 1000 MG PO TABS: 1000.0000 mg | ORAL_TABLET | Freq: Two times a day (BID) | ORAL | 3 refills | Status: DC

## 2021-03-30 MED ORDER — EMPAGLIFLOZIN 25 MG PO TABS: 25.0000 mg | ORAL_TABLET | Freq: Every day | ORAL | 0 refills | Status: DC

## 2021-03-30 MED ORDER — METFORMIN HCL 1000 MG PO TABS: 1000.0000 mg | ORAL_TABLET | Freq: Two times a day (BID) | ORAL | 1 refills | Status: DC

## 2021-03-30 NOTE — Telephone Encounter (Signed)
Patient is aware 

## 2021-03-30 NOTE — Telephone Encounter (Signed)
Samples documented and Metformin refill sent.

## 2021-03-30 NOTE — Telephone Encounter (Signed)
Left message on machine for patient to return our call 

## 2021-04-02 ENCOUNTER — Ambulatory Visit (INDEPENDENT_AMBULATORY_CARE_PROVIDER_SITE_OTHER): Payer: Medicare Other

## 2021-04-02 DIAGNOSIS — R030 Elevated blood-pressure reading, without diagnosis of hypertension: Secondary | ICD-10-CM

## 2021-04-02 DIAGNOSIS — E7800 Pure hypercholesterolemia, unspecified: Secondary | ICD-10-CM

## 2021-04-02 DIAGNOSIS — E119 Type 2 diabetes mellitus without complications: Secondary | ICD-10-CM

## 2021-04-02 NOTE — Patient Instructions (Signed)
Visit Information  PATIENT GOALS:  Goals Addressed             This Visit's Progress    Monitor and Manage My Blood Sugar-Diabetes Type 2   On track    Timeframe:  Long-Range Goal Priority:  High Start Date:       10/03/20                      Expected End Date:           09/12/21           Follow Up Date 06/26/21   - check blood sugar at prescribed times - check blood sugar if I feel it is too high or too low - enter blood sugar readings and medication or insulin into daily log - take the blood sugar log to all doctor visits  -continue to try drinking half and half sweet tea 4 times a week instead of sweet tea   Why is this important?   Checking your blood sugar at home helps to keep it from getting very high or very low.  Writing the results in a diary or log helps the doctor know how to care for you.  Your blood sugar log should have the time, date and the results.  Also, write down the amount of insulin or other medicine that you take.  Other information, like what you ate, exercise done and how you were feeling, will also be helpful.     Notes:      Track and Manage My Blood Pressure-Hypertension   On track    Timeframe:  Long-Range Goal Priority:  Medium Start Date:      10/03/20                       Expected End Date:     09/12/21                 Follow Up Date 06/26/21    - check blood pressure daily - choose a place to take my blood pressure (home, clinic or office, retail store) - write blood pressure results in a log or diary  -take blood pressure log to provider visits   Why is this important?   You won't feel high blood pressure, but it can still hurt your blood vessels.  High blood pressure can cause heart or kidney problems. It can also cause a stroke.  Making lifestyle changes like losing a little weight or eating less salt will help.  Checking your blood pressure at home and at different times of the day can help to control blood pressure.  If the doctor  prescribes medicine remember to take it the way the doctor ordered.  Call the office if you cannot afford the medicine or if there are questions about it.     Notes:         Patient verbalizes understanding of instructions provided today and agrees to view in [place].   Telephone follow up appointment with care management team member scheduled for: 06/26/21 Peter Garter RN, [facility], CDE Care Management Coordinator [place] [phone], Mobile [phone]

## 2021-04-02 NOTE — Chronic Care Management (AMB) (Signed)
Chronic Care Management   CCM RN Visit Note  04/02/2021 Name: Jeff Jenkins MRN: [id number] DOB: [birth year]  Subjective: Jeff Jenkins is a 65 y.o. year old male who is a primary care patient of Philip Aspen, Limmie Patricia, MD. The care management team was consulted for assistance with disease management and care coordination needs.    Engaged with patient by telephone for follow up visit in response to provider referral for case management and/or care coordination services.   Consent to Services:  The patient was given information about Chronic Care Management services, agreed to services, and gave verbal consent prior to initiation of services.  Please see initial visit note for detailed documentation.   Patient agreed to services and verbal consent obtained.   Assessment: Review of patient past medical history, allergies, medications, health status, including review of consultants reports, laboratory and other test data, was performed as part of comprehensive evaluation and provision of chronic care management services.   SDOH (Social Determinants of Health) assessments and interventions performed:    CCM Care Plan  No Known Allergies  Outpatient Encounter Medications as of 04/02/2021  Medication Sig   acetaminophen (TYLENOL) 325 MG tablet Take 650 mg by mouth every 6 (six) hours as needed for moderate pain.   aspirin EC 81 MG tablet Take 81 mg by mouth daily. Swallow whole.   atorvastatin (LIPITOR) 40 MG tablet TAKE 1 TABLET BY MOUTH EVERY DAY   azelastine (ASTELIN) 0.1 % nasal spray Place 1 spray into both nostrils 2 (two) times daily. Use in each nostril as directed   celecoxib (CELEBREX) 200 MG capsule Take 200 mg by mouth daily.   empagliflozin (JARDIANCE) 25 MG TABS tablet Take 1 tablet (25 mg total) by mouth daily before breakfast.   esomeprazole (NEXIUM) 40 MG capsule TAKE 1 CAPSULE DAILY BEFORE BREAKFAST.   ezetimibe (ZETIA) 10 MG tablet Take 1 tablet (10 mg  total) by mouth daily.   fexofenadine (ALLEGRA) 180 MG tablet Take 180 mg by mouth daily.   HYDROcodone-acetaminophen (NORCO) 7.5-325 MG tablet Take 1-2 tablets by mouth every 4 (four) hours as needed.   Lancets (ONETOUCH ULTRASOFT) lancets 1 each by Other route daily. Dx E11.9   lisinopril (ZESTRIL) 5 MG tablet Take 1 tablet (5 mg total) by mouth daily.   metFORMIN (GLUCOPHAGE) 1000 MG tablet Take 1 tablet (1,000 mg total) by mouth 2 (two) times daily with a meal.   methocarbamol (ROBAXIN) 500 MG tablet Take 500 mg by mouth every 6 (six) hours as needed.   ONETOUCH ULTRA test strip CHECK BLOOD SUGAR DAILY   sildenafil (VIAGRA) 100 MG tablet TAKE 1 TABLET BY MOUTH DAILY AS NEEDED   SYNJARDY XR 25-1000 MG TB24 Take 1 tablet by mouth daily.   tadalafil (CIALIS) 5 MG tablet Take 5 mg by mouth daily as needed for erectile dysfunction.    Testosterone 30 MG/ACT SOLN Place onto the skin daily.   No facility-administered encounter medications on file as of 04/02/2021.    Patient Active Problem List   Diagnosis Date Noted   Chest pain of uncertain etiology 12/12/2020   Prostate cancer (HCC) 09/20/2020   Erectile dysfunction 09/15/2019   Coronary artery disease due to lipid rich plaque 05/20/2019   Coronary artery calcification seen on CT scan 05/20/2019   Type 2 diabetes mellitus without obesity (HCC) 03/25/2019   PVC's (premature ventricular contractions) 03/25/2019   Elevated blood pressure reading in office with white coat syndrome, without diagnosis of hypertension 03/25/2019  Pure hypercholesterolemia 03/25/2019   History of esophageal stricture 02/01/2015   Special screening for malignant neoplasms, colon 10/15/2010   HEMORRHOIDS-EXTERNAL 09/15/2008   HEMORRHOIDS 09/15/2008   GERD 09/15/2008    Conditions to be addressed/monitored:HTN, HLD, and DMII  Care Plan : RNCM:Diabetes Type 2 (Adult)  Updates made by Dimitri Ped, RN since 04/02/2021 12:00 AM     Problem: Glycemic  Management (Diabetes, Type 2)   Priority: High     Long-Range Goal: Glycemic Management Optimized   Start Date: 10/03/2020  Expected End Date: 09/12/2021  Recent Progress: On track  Priority: High  Note:   Objective:  Lab Results  Component Value Date   HGBA1C 6.1 (A) 09/20/2020   Lab Results  Component Value Date   CREATININE 0.74 06/22/2020   CREATININE 0.74 01/27/2020   CREATININE 0.78 04/28/2019   No results found for: EGFR Current Barriers:  Knowledge Deficits related to basic Diabetes pathophysiology and self care/management in pt with other chronic conditions of HLD and HTN Knowledge Deficits related to medications used for management of diabetes Does not adhere to provider recommendations re: diet and exercise reports CBG checks every 1-2 weeks range from  fasting in AM, denies any low readings, states he has been trying to eat healthy, reports doing yard work and helping his son farm for exercise, States he is also bowling 2 times a week.  states his Hemoglobin A1C was  to 5.9% when he saw his doctor on 03/27/21.  States he is now in the doughnut hole so his doctor is switching his Synjardy to Metformin and Jardiance until the start of next year Case Manager Clinical Goal(s):  patient will demonstrate improved adherence to prescribed treatment plan for diabetes self care/management as evidenced by: daily monitoring and recording of CBG  adherence to ADA/ carb modified diet exercise 3 days/week adherence to prescribed medication regimen contacting provider for new or worsened symptoms or questions Interventions:  Collaboration with Isaac Bliss, Rayford Halsted, MD regarding development and update of comprehensive plan of care as evidenced by provider attestation and co-signature Inter-disciplinary care team collaboration (see longitudinal plan of care) Reviewed education to patient about basic DM disease process Reviewed medications with patient and discussed importance of  medication adherence Discussed plans with patient for ongoing care management follow up and provided patient with direct contact information for care management team Reviewed scheduled/upcoming provider appointments including:no appointments scheduled  Reinforced to check cbg 3-4 times a week and record, calling primary care  provider  for findings outside established parameters.   Review of patient status, including review of consultants reports, relevant laboratory and other test results, and medications completed. Reinforced s/sx of hypoglycemia and the rule of 15 to treat hypoglycemia Reinforced to continue to try limiting drinking sweet tea and concentrated sweets Reviewed importance of drinking adequate amounts of fluids when working or exercising outside in the heat especially  Reviewed to take Metformin and Jardiance when he is out of the [employer] - Self administers oral medications as prescribed Attends all scheduled provider appointments Checks blood sugars as prescribed and utilize hyper and hypoglycemia protocol as needed Adheres to prescribed ADA/carb modified Patient Goals: - check blood sugar at prescribed times - check blood sugar if I feel it is too high or too low - enter blood sugar readings and medication or insulin into daily log - take the blood sugar log to all doctor visits - change to whole grain breads, cereal, pasta - drink 6  to 8 glasses of water each day - fill half of plate with vegetables - read food labels for fat, fiber, carbohydrates and portion size - switch to sugar-free drinks - keep appointment with eye doctor - check feet daily for cuts, sores or redness -continue to try drinking half and half sweet tea 4 times a week instead of sweet tea Follow Up Plan: Telephone follow up appointment with care management team member scheduled for: 06/26/21 at 10 AM The patient has been provided with contact information for the care management  team and has been advised to call with any health related questions or concerns.      Care Plan : RNCM:Hypertension (Adult)  Updates made by Dimitri Ped, RN since 04/02/2021 12:00 AM     Problem: Hypertension (Hypertension)   Priority: Medium     Long-Range Goal: Hypertension Monitored   Start Date: 10/03/2020  Expected End Date: 09/12/2021  Recent Progress: On track  Priority: Medium  Note:   Objective:  Last practice recorded BP readings:  BP Readings from Last 3 Encounters:  09/20/20 140/80  08/30/20 130/80  08/21/20 133/83   Most recent eGFR/CrCl: No results found for: EGFR  No components found for: CRCL Current Barriers:  Knowledge Deficits related to basic understanding of hypertension pathophysiology and self care management Knowledge Deficits related to understanding of medications prescribed for management of hypertension Does not adhere to provider recommendations re: diet eats out frequently Reports B/P was 118/74  when lasted checked at home, reports he checks 2-3 times a week in the evening when he is relaxed, reports he does yard work and helps his son farm for exercise, reports trying to follow a low sodium diet most of the time, denies any chest pains or shortness or breath Case Manager Clinical Goal(s):  patient will verbalize understanding of plan for hypertension management patient will attend all scheduled medical appointments: no upcoming appointments patient will demonstrate improved health management independence as evidenced by checking blood pressure as directed and notifying PCP if SBP>160 or DBP > 90, taking all medications as prescribe, and adhering to a low sodium diet as discussed. Interventions:  Collaboration with Isaac Bliss, Rayford Halsted, MD regarding development and update of comprehensive plan of care as evidenced by provider attestation and co-signature Inter-disciplinary care team collaboration (see longitudinal plan of care) Evaluation  of current treatment plan related to hypertension self management and patient's adherence to plan as established by provider. Reinforced education to patient on stroke prevention, s/s of heart attack and stroke, DASH diet, complications of uncontrolled blood pressure Reviewed medications with patient and discussed importance of compliance Discussed plans with patient for ongoing care management follow up and provided patient with direct contact information for care management team Reinforced to monitor blood pressure daily and record, calling PCP for findings outside established parameters.  Encouraged to try checking B/P at different times  Reviewed scheduled/upcoming provider appointments including: no upcoming appointments Self-Care Activities: - Self administers medications as prescribed Attends all scheduled provider appointments Calls provider office for new concerns, questions, or BP outside discussed parameters Checks BP and records as discussed Follows a low sodium diet/DASH diet Patient Goals: - check blood pressure 3 times per week - choose a place to take my blood pressure (home, clinic or office, retail store) - write blood pressure results in a log or diary - ask questions to understand - learn about high blood pressure -take B/P log to provider visits Follow Up Plan: Telephone follow up appointment with  care management team member scheduled for: 06/26/21 at 10 AM The patient has been provided with contact information for the care management team and has been advised to call with any health related questions or concerns.       Plan:Telephone follow up appointment with care management team member scheduled for:  06/26/21 and The patient has been provided with contact information for the care management team and has been advised to call with any health related questions or concerns.  Peter Garter RN, Jackquline Denmark, CDE Care Management Coordinator [place]  [facility] [phone], Mobile [phone]

## 2021-04-06 ENCOUNTER — Encounter: Admit: 2021-04-06 | Discharge: 2021-04-06 | Payer: MEDICARE

## 2021-04-09 ENCOUNTER — Encounter: Admit: 2021-04-09 | Discharge: 2021-04-09 | Payer: MEDICARE

## 2021-04-09 NOTE — Patient Instructions
Coronary CT Angiography Instructions    Tommy Miller  [id number]  08-08-1955  04/09/2021    ARRIVAL TIME    Please report to the Cardiovascular Medicine Clinic at the [facility] on: 04/18/21  Please arrive at the following time: 1100 CTA 1130 OV  1230 PAC            DO NOT EAT FOR 4 HOURS PRIOR TO YOUR PROCEDURE.  YOU SHOULD DRINK PLENTY OF CLEAR LIQUIDS UP TO ARRIVAL AT OFFICE.      It is important to take these medications exactly as written.  These medications prepare you for the procedure.    Do not take any non-steroidal inflammatory medications, (ibuprofen, Aleve, Advil, etc.) for 48 hours beginning the day of the procedure.    Hold All Diuretics For 24 Hours Beginning The Day Of The Procedure: Verified    You May Take All Other Medications With Water The Morning Of The Procedure: Verified        ALLERGIES    No Known Allergies      CURRENT MEDICATIONS  Outpatient Encounter Medications as of 04/09/2021   Medication Sig Dispense Refill    amLODIPine (NORVASC) 10 mg tablet Take one tablet by mouth daily. 90 tablet 1    atorvastatin (LIPITOR) 40 mg tablet Take one tablet by mouth at bedtime daily. 90 tablet 3    fluticasone propionate (FLONASE) 50 mcg/actuation nasal spray, suspension Apply two sprays to each nostril as directed daily. Shake bottle gently before using. 16 g 4    metoprolol XL (TOPROL XL) 100 mg extended release tablet Take 1 tab in am, 1/2 tab in pm 90 tablet 1    metroNIDAZOLE (METROGEL) 1 % topical gel Apply one g topically to affected area twice daily. 60 g 11    minocycline (MINOCIN) 100 mg capsule Take one capsule by mouth daily. (Patient taking differently: Take 50 mg by mouth daily.) 90 capsule 3    omeprazole DR (PRILOSEC) 20 mg capsule Take one capsule by mouth daily before breakfast. 90 capsule 1    rivaroxaban (XARELTO) 20 mg tablet Take one tablet by mouth daily with dinner. Take with food. 90 tablet 3     No facility-administered encounter medications on file as of 04/09/2021.       If you have any questions, please call the [facility] office at [phone] or [phone].

## 2021-04-09 NOTE — Telephone Encounter
Golda Acre, LPN  P Cvm Nurse Ep Team D  VM on triage line from Misty Stanley with [facility] at 2:29pm.   York Spaniel that they need to schedule him for EGD / colonoscopy.   They need cardiac clearance and hold Xarelto.   He told them that he is scheduled for ablation on 04-19-21.   Call [place] at #[phone] or #[phone].     --------------------------------------      RC to Misty Stanley at the surgery center.  They would like to do the EGD/colonoscopy this week. Pt reported to them that he missed a family member's wedding this weekend due to GIB. He said it was cupfuls of bright red blood. Last dose of Xarelto was Thursday evening. Pt has not had the bleeding since stopping Xarelto.       I will review with RRR. Pt is scheduled for ablation on 10/6.

## 2021-04-09 NOTE — Telephone Encounter
-----   Message from Carleene Overlie, RN sent at 04/09/2021 10:49 AM CDT -----  Regarding: RRR - Pt off Xarelto    ----- Message -----  From: Edrick Oh, RN  Sent: 04/09/2021  10:42 AM CDT  To: Cvm Nurse Ep Team B  Subject: Pt off Xarelto                                   I just spoke with pt and gave him pre cta instructions.  He stated that he has been off his Xarelto since Friday.  He has a possible GI bleed.  Lost a lot of blood per pt.  He is seeing GI today at 1115 to see what they recommend.  I told him you would reach out this afternoon and see what GI says.

## 2021-04-10 ENCOUNTER — Encounter: Admit: 2021-04-10 | Discharge: 2021-04-10 | Payer: MEDICARE

## 2021-04-10 NOTE — Telephone Encounter
-----   Message from Ardelle Balls, RN sent at 04/10/2021  9:36 AM CDT -----  Regarding: Cancel CCTA  Please cancel CCTA scheduled on 10/5. We are postponing the ablation.    Thank you

## 2021-04-13 DIAGNOSIS — E7800 Pure hypercholesterolemia, unspecified: Secondary | ICD-10-CM | POA: Diagnosis not present

## 2021-04-13 DIAGNOSIS — E119 Type 2 diabetes mellitus without complications: Secondary | ICD-10-CM

## 2021-04-18 ENCOUNTER — Encounter: Admit: 2021-04-18 | Discharge: 2021-04-18 | Payer: MEDICARE

## 2021-04-26 ENCOUNTER — Other Ambulatory Visit: Payer: Self-pay

## 2021-04-26 ENCOUNTER — Ambulatory Visit (INDEPENDENT_AMBULATORY_CARE_PROVIDER_SITE_OTHER): Payer: Medicare Other

## 2021-04-26 DIAGNOSIS — M1712 Unilateral primary osteoarthritis, left knee: Secondary | ICD-10-CM | POA: Diagnosis not present

## 2021-04-26 DIAGNOSIS — E538 Deficiency of other specified B group vitamins: Secondary | ICD-10-CM

## 2021-04-26 MED ORDER — CYANOCOBALAMIN 1000 MCG/ML IJ SOLN
1000.0000 ug | Freq: Once | INTRAMUSCULAR | Status: AC
  Administered 2021-04-26: 1000 ug via INTRAMUSCULAR

## 2021-04-26 NOTE — Progress Notes (Signed)
Per orders of Cory Nafziger NP, injection of Cyanocobalamin 1000 mcg given by Egon Dittus L Geneviene Tesch. Patient tolerated injection well.  

## 2021-05-01 ENCOUNTER — Encounter: Admit: 2021-05-01 | Discharge: 2021-05-01 | Payer: MEDICARE

## 2021-05-08 ENCOUNTER — Other Ambulatory Visit: Payer: Self-pay | Admitting: Internal Medicine

## 2021-05-08 DIAGNOSIS — E7800 Pure hypercholesterolemia, unspecified: Secondary | ICD-10-CM

## 2021-05-28 ENCOUNTER — Ambulatory Visit (INDEPENDENT_AMBULATORY_CARE_PROVIDER_SITE_OTHER): Payer: Medicare Other | Admitting: *Deleted

## 2021-05-28 ENCOUNTER — Encounter: Admit: 2021-05-28 | Discharge: 2021-05-28 | Payer: MEDICARE

## 2021-05-28 DIAGNOSIS — E538 Deficiency of other specified B group vitamins: Secondary | ICD-10-CM | POA: Diagnosis not present

## 2021-05-28 MED ORDER — CYANOCOBALAMIN 1000 MCG/ML IJ SOLN
1000.0000 ug | Freq: Once | INTRAMUSCULAR | Status: AC
  Administered 2021-05-28: 1000 ug via INTRAMUSCULAR

## 2021-05-28 NOTE — Progress Notes (Signed)
Per orders of Dr. Burchette, injection of Cyanocobalamin 1000mcg given by Opha Mcghee A. Patient tolerated injection well.  

## 2021-05-28 NOTE — Telephone Encounter
-----   Message from Golda Acre, LPN sent at 08/81/1031  8:37 AM CST -----  Regarding: RRR- med ?  VM on triage line from male at 5:44pm Sunday.  Said that he has the flu or something, with bad cough.  Can he use one of his 3 inhalers?  Call her at #[phone].

## 2021-05-28 NOTE — Telephone Encounter
RC to pt. He states he has been coughing a lot recently. He also had a temp of 101.7 and chills. He is wondering what OTC medications and inhalers he can use that won't affect his AF. We reviewed safe medications to take and ones to avoid. He states his heart rate has been around 95-120 for the last 1.5 days. He is back on Eliquis after severe hemorrhoid bleeding and continues on his metoprolol. He wears his cpap at night. I advised he test for covid. Advised he call PCP for recommendations. He will call us back if he continues to have high HR. Reviewed plan with the patient. Patient verbalized understanding and does not have any further questions or concerns. No further education requested from patient. Patient has our contact information for future needs.

## 2021-05-29 ENCOUNTER — Encounter: Admit: 2021-05-29 | Discharge: 2021-05-29 | Payer: MEDICARE

## 2021-06-15 ENCOUNTER — Telehealth (HOSPITAL_BASED_OUTPATIENT_CLINIC_OR_DEPARTMENT_OTHER): Payer: Self-pay | Admitting: Cardiology

## 2021-06-15 ENCOUNTER — Encounter: Admit: 2021-06-15 | Discharge: 2021-06-15 | Payer: MEDICARE

## 2021-06-15 NOTE — Telephone Encounter (Signed)
Primary Cardiologist:Bridgette Harrell Gave, MD  Chart reviewed as part of pre-operative protocol coverage. Because of Jeff Jenkins's past medical history and time since last visit, he/she will require a follow-up visit in order to better assess preoperative cardiovascular risk.  Pre-op covering staff: - Please schedule appointment and call patient to inform them. - Please contact requesting surgeon's office via preferred method (i.e, phone, fax) to inform them of need for appointment prior to surgery.  If applicable, this message will also be routed to pharmacy pool and/or primary cardiologist for input on holding anticoagulant/antiplatelet agent as requested below so that this information is available at time of patient's appointment.   Jeff Pelton, NP  06/15/2021, 3:45 PM

## 2021-06-15 NOTE — Telephone Encounter (Signed)
Pt is scheduled 06/22/21 for pre op clearance with Dr. Buford Dresser. I will send notes to MD for upcoming appt. Will send FYI to requesting office  pt has appt 06/22/21.

## 2021-06-15 NOTE — Telephone Encounter (Signed)
   [facility] Pre-operative Risk Assessment    Patient Name: Jeff Jenkins  DOB: 1955-09-30 MRN: [id number]   Request for surgical clearance:  What type of surgery is being performed? Left Total Knee Arthroplasty  When is this surgery scheduled? TBD-08/03/21  What type of clearance is required (medical clearance vs. Pharmacy clearance to hold med vs. Both)? Both  Are there any medications that need to be held prior to surgery and how long? ASA 81  Practice name and name of physician performing surgery? [facility]; Dr. Paralee Cancel  What is the office phone number? [phone]   7.   What is the office fax number? [phone] (ATTN: Sherry)  8.   Anesthesia type (None, local, MAC, general) ? Spinal   [name] 06/15/2021, 3:20 PM  _________________________________________________________________   (provider comments below)

## 2021-06-22 ENCOUNTER — Ambulatory Visit (INDEPENDENT_AMBULATORY_CARE_PROVIDER_SITE_OTHER): Payer: Medicare Other | Admitting: Cardiology

## 2021-06-22 ENCOUNTER — Other Ambulatory Visit: Payer: Self-pay

## 2021-06-22 VITALS — BP 122/82 | HR 79 | Ht 72.0 in | Wt 173.1 lb

## 2021-06-22 DIAGNOSIS — E119 Type 2 diabetes mellitus without complications: Secondary | ICD-10-CM

## 2021-06-22 DIAGNOSIS — Z01818 Encounter for other preprocedural examination: Secondary | ICD-10-CM

## 2021-06-22 DIAGNOSIS — I2510 Atherosclerotic heart disease of native coronary artery without angina pectoris: Secondary | ICD-10-CM

## 2021-06-22 DIAGNOSIS — E7800 Pure hypercholesterolemia, unspecified: Secondary | ICD-10-CM | POA: Diagnosis not present

## 2021-06-22 DIAGNOSIS — Z7189 Other specified counseling: Secondary | ICD-10-CM

## 2021-06-22 NOTE — Progress Notes (Signed)
Cardiology Office Note:    Date:  06/22/2021   ID:  Jeff Jenkins, DOB 03/05/1956, MRN [id number]  PCP:  Isaac Bliss, Rayford Halsted, MD  Cardiologist:  Buford Dresser, MD  Referring MD: Isaac Bliss, [place]*   CC: Follow-up  History of Present Illness:    Jeff Jenkins is a 65 y.o. male with a hx of  significant coronary calcification, nonobstructive CAD, family history of heart disease, type II diabetes lifestyle controlled who is seen for follow up today. He was initially seen 04/04/19 as a new consult at the request of Isaac Bliss, [place] [facility]* for the evaluation and management of elevated cardiovascular risk.  CV history/risk factors: Strong family history: lost mother to his heart attack suddenly at age 49 (had evidence of old MI that was unknown). Lost his brother to a massive stroke at age 53.   Today: He is accompanied by his wife. Overall, he has been feeling fine.   He needs pre-op clearance today for a left total knee arthroplasty not yet scheduled, requiring hold of ASA 81. There were no complications during his previous arthroplasty. He is climbing stairs daily with no issues. [place] He rested at times due to his knee pain but not because of shortness of breath or chest pain.  Mostly in the afternoons, he has some shortness of breath that he attributes to allergies. This often occurs while he is sitting in a tree stand while deer hunting. Sometimes he feels unable to breathe deeply for a short time. Additionally, he endorses constant nasal drainage and feelings of upper respiratory congestion.  For a time he switched to metformin due to his Synjardy becoming expensive. However, he did not tolerate the metformin as well, and is now back on [place].  His A1C is monitored every 3 months and he recieves a B12 injection every month. He follows-up with his PCP yearly.  He  denies any palpitations, or chest pain. No lightheadedness, headaches, syncope, orthopnea, PND, lower extremity edema or exertional symptoms.  Past Medical History:  Diagnosis Date   Allergy    Arthritis    Broken ankle 2008   Coronary artery disease    Diverticulosis    ED (erectile dysfunction)    Facial fracture ([place]) 1979   GERD (gastroesophageal reflux disease)    Hearing loss in right ear    Hemorrhoids    Hiatal hernia    Hyperlipidemia    Hypertension    Peptic stricture of esophagus    Peptic ulcer disease 1989   Prostate cancer ([place]) 02/2009   Seasonal allergies    Type 2 diabetes mellitus ([place])     Past Surgical History:  Procedure Laterality Date   ANKLE SURGERY Left 2008   CHOLECYSTECTOMY N/A 01/31/2020   Procedure: LAPAROSCOPIC CHOLECYSTECTOMY WITH INTRAOPERATIVE CHOLANGIOGRAM;  Surgeon: Clovis Riley, MD;  Location: [facility];  Service: General;  Laterality: N/A;   FACIAL FRACTURE SURGERY     INGUINAL HERNIA REPAIR Left    KNEE ARTHROSCOPY Left 2013   KNEE ARTHROSCOPY  [id number]   [facility]   PROSTATECTOMY  2011   TONSILLECTOMY     UPPER GASTROINTESTINAL ENDOSCOPY     VASECTOMY      Current Medications: Current Outpatient Medications on File Prior to Visit  Medication Sig   acetaminophen (TYLENOL) 325 MG tablet Take 650 mg by mouth every 6 (six) hours as needed for moderate pain.   aspirin EC  81 MG tablet Take 81 mg by mouth daily. Swallow whole.   atorvastatin (LIPITOR) 40 MG tablet TAKE 1 TABLET BY MOUTH EVERY DAY   azelastine (ASTELIN) 0.1 % nasal spray Place 1 spray into both nostrils 2 (two) times daily. Use in each nostril as directed   esomeprazole (NEXIUM) 40 MG capsule TAKE 1 CAPSULE DAILY BEFORE BREAKFAST.   ezetimibe (ZETIA) 10 MG tablet TAKE ONE TABLET BY MOUTH DAILY   fexofenadine (ALLEGRA) 180 MG tablet Take 180 mg by mouth daily.   Lancets (ONETOUCH ULTRASOFT) lancets 1 each by Other route daily. Dx E11.9   lisinopril (ZESTRIL) 5 MG  tablet Take 1 tablet (5 mg total) by mouth daily.   methocarbamol (ROBAXIN) 500 MG tablet Take 500 mg by mouth every 6 (six) hours as needed.   ONETOUCH ULTRA test strip CHECK BLOOD SUGAR DAILY   sildenafil (VIAGRA) 100 MG tablet TAKE 1 TABLET BY MOUTH DAILY AS NEEDED   SYNJARDY XR 25-1000 MG TB24 Take 1 tablet by mouth daily.   tadalafil (CIALIS) 5 MG tablet Take 5 mg by mouth daily as needed for erectile dysfunction.    Testosterone 30 MG/ACT SOLN Place onto the skin daily.   No current facility-administered medications on file prior to visit.     Allergies:   Patient has no known allergies.   Social History   Tobacco Use   Smoking status: Never   Smokeless tobacco: Never  Vaping Use   Vaping Use: Never used  Substance Use Topics   Alcohol use: Yes    Alcohol/week: 12.0 standard drinks    Types: 12 Standard drinks or equivalent per week    Comment: social    Drug use: No    Family History: The patient's family history includes Alcohol abuse in his father; Breast cancer in his mother; Diabetes in his mother; Heart attack in his mother; Stroke in his brother. There is no history of Colon cancer, Esophageal cancer, Rectal cancer, or Stomach cancer.  ROS:   Please see the history of present illness.   (+) Left knee pain (+) Shortness of breath (+) Seasonal allergies (+) Nasal drainage (+) Congestion Additional pertinent ROS otherwise negative  EKGs/Labs/Other Studies Reviewed:    The following studies were reviewed today:  Cardiac CT 05-18-19 Aorta: Mildly dilated aortic root (42 mm). Minimal calcifications. No dissection.   Aortic Valve:  Trileaflet.  No calcifications.  Coronary Arteries:  Normal coronary origin.  Right dominance.   RCA is a large dominant artery that gives rise to PDA and PLVB. There is diffuse minimal calcified plaque (0-24%); mild calcified plaque (25-49%) at takeoff of PDA. Minimal plaque (0-24%) in posterolateral.   Left main is a large artery  that gives rise to LAD and LCX arteries. There is no plaque.   LAD is a large vessel that has 2 diagonals. There is calcified plaque in the proximal (50-69%) and mid vessel (0-24%); there is mild calcified plaque (25-49%) in proximal D1.   LCX is a non-dominant artery that gives rise to small OM1 and OM2. There is minimal calcified plaque (0-24%) noted.   Other findings:  Normal pulmonary vein drainage into the left atrium.  Normal let atrial appendage without a thrombus.  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   IMPRESSION: 1. Coronary calcium score of 2319. This was 53 percentile for age and sex matched control.   2. Normal coronary origin with right dominance.   3. Diffuse nonobstructive 3 vessel calcified plaque; 50-69% stenosis in proximal  LAD; 25-49% stenosis proximal D1 and in RCA at takeoff of PDA. CADRADS-3.  Monitor report 04/2019 14 days of data recorded on Zio monitor. Patient had a min HR of 48 bpm, max HR of 184 bpm, and avg HR of 86 bpm. Predominant underlying rhythm was Sinus Rhythm. No VT, SVT, atrial fibrillation, high degree block, or pauses noted. Isolated atrial ectopy was rare (<1%). Isolated ventricular ectopy was occasional (2.6%). There was a single 4 beat run of SVT and a single 4 beat run of PVCs.  Longest ventricular bigeminy was 23 seconds, and longest ventricular trigeminy episode was 38 seconds. There were 0 triggered events.    EKG:  EKG is personally reviewed.  06/22/2021: Sinus rhythm. Rate 79 bpm. 12/12/20: SR with PVC couplet, HR 93 bpm 03/25/19: SR with PVCs--two morphologies, and 2 PVCs are consecutive  Recent Labs: 09/20/2020: Hemoglobin 14.8; Platelets 180   Recent Lipid Panel    Component Value Date/Time   CHOL 139 06/22/2020 1052   CHOL 170 08/23/2019 1221   TRIG 57 06/22/2020 1052   HDL 73 06/22/2020 1052   HDL 66 08/23/2019 1221   CHOLHDL 1.9 06/22/2020 1052   LDLCALC 53 06/22/2020 1052    Physical Exam:    VS:  BP 122/82 (BP  Location: Left Arm, Patient Position: Sitting, Cuff Size: Normal)   Pulse 79   Ht 6' (1.829 m)   Wt 173 lb 1.6 oz (78.5 kg)   BMI 23.48 kg/m     Wt Readings from Last 3 Encounters:  06/22/21 173 lb 1.6 oz (78.5 kg)  03/27/21 168 lb 9.6 oz (76.5 kg)  12/22/20 167 lb 9.6 oz (76 kg)   GEN: Well nourished, well developed in no acute distress HEENT: Normal, moist mucous membranes NECK: No JVD CARDIAC: regular rhythm, normal S1 and S2, no rubs or gallops. No murmur. VASCULAR: Radial and DP pulses 2+ bilaterally. No carotid bruits RESPIRATORY:  Clear to auscultation without rales, wheezing or rhonchi  ABDOMEN: Soft, non-tender, non-distended MUSCULOSKELETAL:  Ambulates independently.  SKIN: Warm and dry, no edema NEUROLOGIC:  Alert and oriented x 3. No focal neuro deficits noted. PSYCHIATRIC:  Normal affect     ASSESSMENT:    1. Pre-op evaluation   2. Nonocclusive coronary atherosclerosis of native coronary artery   3. Pure hypercholesterolemia   4. Type 2 diabetes mellitus without obesity (HCC)   5. Cardiac risk counseling     PLAN:    Preoperative cardiovascular evaluation Based on available date, patient's RCRI score = 0, which carries a 3.9% 30-day risk of death, MI, or cardiac arrest.  The patient is not currently having active cardiac symptoms, and they can achieve >4 METs of activity.  According to ACC/AHA Guidelines, no further testing is needed.  Proceed with surgery at acceptable risk. The patient should restart aspirin as soon as possible post operatively.   Nonobstructive CAD Significant coronary calcification -while no plaque with >70% stenosis suggest that chest pain is not ischemic in origin, his very high calcium score and mixed/lipid rich plaque in proximal LAD is concerning. Has diffuse calcified plaue in 3 vessels as well as focal mixed/lipid rich plaque -continue aspirin 81 mg  -tolerating statin, as below  Hypercholesterolemia: goal LDL <70 given  CAD -he is tolerating atorvastatin 40 mg daily, continue -on ezetimibe 10 mg daily as well -From 02/08/19 (prior to statin): Tchol 181, TG 102, HDL 37, LDL 124 -post statin lipids 08/23/19: Tchol 170, TG 70, HDL 66, LDL 91 -lipids 06/22/20: Tchol  139, HDL 73, LDL 53, TG 57 -continuing to work on lifestyle  Erectile dysfunction: see prior discussion re: PDE5i  PVCs: burden <3%.  -currently asymptomatic, but if worsens consider echo in the future. Have discussed beta blockers, declines currently. Monitor for symptoms.   Type II diabetes without obesity: BMI 23 -on synjardy, which contains empagliflozin and metformin. Therefore is receiving SGLT2i benefit with CAD  Cardiac risk counseling and prevention recommendations: -recommend heart healthy/Mediterranean diet, with whole grains, fruits, vegetable, fish, lean meats, nuts, and olive oil. Limit salt. -recommend moderate walking, 3-5 times/week for 30-50 minutes each session. Aim for at least 150 minutes.week. Goal should be pace of 3 miles/hours, or walking 1.5 miles in 30 minutes -recommend avoidance of tobacco products. Avoid excess alcohol.  Plan for follow up: 6 mos or sooner as needed  Medication Adjustments/Labs and Tests Ordered: Current medicines are reviewed at length with the patient today.  Concerns regarding medicines are outlined above.   Orders Placed This Encounter  Procedures   EKG 12-Lead    No orders of the defined types were placed in this encounter.  Patient Instructions  Medication Instructions:  Your Physician recommend you continue on your current medication as directed.    *If you need a refill on your cardiac medications before your next appointment, please call your pharmacy*   Lab Work: None ordered today   Testing/Procedures: None ordered today   Follow-Up: At [facility], you and your health needs are our priority.  As part of our continuing mission to provide you with exceptional heart care,  we have created designated Provider Care Teams.  These Care Teams include your primary Cardiologist (physician) and Advanced Practice Providers (APPs -  Physician Assistants and Nurse Practitioners) who all work together to provide you with the care you need, when you need it.  We recommend signing up for the patient portal called "MyChart".  Sign up information is provided on this After Visit Summary.  MyChart is used to connect with patients for Virtual Visits (Telemedicine).  Patients are able to view lab/test results, encounter notes, upcoming appointments, etc.  Non-urgent messages can be sent to your provider as well.   To learn more about what you can do with MyChart, go to [email or web address].    Your next appointment:   6 month(s)  The format for your next appointment:   In Person  Provider:   Buford Dresser, MD       [facility],acting as a scribe for Buford Dresser, MD.,have documented all relevant documentation on the behalf of Buford Dresser, MD,as directed by  Buford Dresser, MD while in the presence of Buford Dresser, MD.  I, Buford Dresser, MD, have reviewed all documentation for this visit. The documentation on 06/22/21 for the exam, diagnosis, procedures, and orders are all accurate and complete.  Signed, Buford Dresser, MD PhD 06/22/2021  [facility]

## 2021-06-22 NOTE — Patient Instructions (Signed)

## 2021-06-26 ENCOUNTER — Ambulatory Visit (INDEPENDENT_AMBULATORY_CARE_PROVIDER_SITE_OTHER): Payer: Medicare HMO | Admitting: Internal Medicine

## 2021-06-26 ENCOUNTER — Ambulatory Visit: Payer: Medicare Other

## 2021-06-26 ENCOUNTER — Encounter: Payer: Self-pay | Admitting: Internal Medicine

## 2021-06-26 VITALS — BP 130/80 | HR 82 | Temp 98.1°F | Ht 72.0 in | Wt 171.2 lb

## 2021-06-26 DIAGNOSIS — E7800 Pure hypercholesterolemia, unspecified: Secondary | ICD-10-CM | POA: Diagnosis not present

## 2021-06-26 DIAGNOSIS — I2510 Atherosclerotic heart disease of native coronary artery without angina pectoris: Secondary | ICD-10-CM | POA: Diagnosis not present

## 2021-06-26 DIAGNOSIS — E538 Deficiency of other specified B group vitamins: Secondary | ICD-10-CM

## 2021-06-26 DIAGNOSIS — E119 Type 2 diabetes mellitus without complications: Secondary | ICD-10-CM

## 2021-06-26 DIAGNOSIS — R030 Elevated blood-pressure reading, without diagnosis of hypertension: Secondary | ICD-10-CM

## 2021-06-26 LAB — POCT GLYCOSYLATED HEMOGLOBIN (HGB A1C): Hemoglobin A1C: 6.2 % — AB (ref 4.0–5.6)

## 2021-06-26 MED ORDER — CYANOCOBALAMIN 1000 MCG/ML IJ SOLN
1000.0000 ug | Freq: Once | INTRAMUSCULAR | Status: AC
Start: 2021-06-26 — End: 2021-06-26
  Administered 2021-06-26: 1000 ug via INTRAMUSCULAR

## 2021-06-26 NOTE — Patient Instructions (Signed)
Visit Information  Thank you for taking time to visit with me today. Please don't hesitate to contact me if I can be of assistance to you before our next scheduled telephone appointment.  Following are the goals we discussed today:  Take all medications as prescribed Attend all scheduled provider appointments Call pharmacy for medication refills 3-7 days in advance of running out of medications Perform all self care activities independently  Perform IADL's (shopping, preparing meals, housekeeping, managing finances) independently Call provider office for new concerns or questions  keep appointment with eye doctor check blood sugar at prescribed times: when you have symptoms of low or high blood sugar and 1-2 times a week check feet daily for cuts, sores or redness take the blood sugar log to all doctor visits drink 6 to 8 glasses of water each day fill half of plate with vegetables manage portion size prepare main meal at home 3 to 5 days each week switch to sugar-free drinks wash and dry feet carefully every day check blood pressure weekly choose a place to take my blood pressure (home, clinic or office, retail store) take blood pressure log to all doctor appointments keep all doctor appointments eat more whole grains, fruits and vegetables, lean meats and healthy fats limit salt intake to 2300mg /day call for medicine refill 2 or 3 days before it runs out take all medications exactly as prescribed call doctor with any symptoms you believe are related to your medicine  Our next appointment is by telephone on 08/28/21 at 10 AM  Please call the care guide team at [phone] if you need to cancel or reschedule your appointment.   If you are experiencing a Mental Health or [place] or need someone to talk to, please call the Suicide and Crisis Lifeline: 988 call 1-800-273-TALK (toll free, 24 hour hotline) go to [facility] [address], [place] [phone]) call 911   Patient verbalizes understanding of instructions provided today and agrees to view in [place].  Peter Garter RN, Jackquline Denmark, CDE Care Management Coordinator [facility] [phone], Mobile [phone]

## 2021-06-26 NOTE — Chronic Care Management (AMB) (Signed)
Chronic Care Management   CCM RN Visit Note  06/26/2021 Name: Jeff Jenkins MRN: [id number] DOB: [birth year]  Subjective: Jeff Jenkins is a 65 y.o. year old male who is a primary care patient of Isaac Bliss, Rayford Halsted, MD. The care management team was consulted for assistance with disease management and care coordination needs.    Engaged with patient by telephone for follow up visit in response to provider referral for case management and/or care coordination services.   Consent to Services:  The patient was given information about Chronic Care Management services, agreed to services, and gave verbal consent prior to initiation of services.  Please see initial visit note for detailed documentation.   Patient agreed to services and verbal consent obtained.   Assessment: Review of patient past medical history, allergies, medications, health status, including review of consultants reports, laboratory and other test data, was performed as part of comprehensive evaluation and provision of chronic care management services.   SDOH (Social Determinants of Health) assessments and interventions performed:    CCM Care Plan  No Known Allergies  Outpatient Encounter Medications as of 06/26/2021  Medication Sig   acetaminophen (TYLENOL) 325 MG tablet Take 650 mg by mouth every 6 (six) hours as needed for moderate pain.   aspirin EC 81 MG tablet Take 81 mg by mouth daily. Swallow whole.   atorvastatin (LIPITOR) 40 MG tablet TAKE 1 TABLET BY MOUTH EVERY DAY   azelastine (ASTELIN) 0.1 % nasal spray Place 1 spray into both nostrils 2 (two) times daily. Use in each nostril as directed   esomeprazole (NEXIUM) 40 MG capsule TAKE 1 CAPSULE DAILY BEFORE BREAKFAST.   ezetimibe (ZETIA) 10 MG tablet TAKE ONE TABLET BY MOUTH DAILY   fexofenadine (ALLEGRA) 180 MG tablet Take 180 mg by mouth daily.   Lancets (ONETOUCH ULTRASOFT) lancets 1 each by Other route daily. Dx E11.9   lisinopril  (ZESTRIL) 5 MG tablet Take 1 tablet (5 mg total) by mouth daily.   methocarbamol (ROBAXIN) 500 MG tablet Take 500 mg by mouth every 6 (six) hours as needed.   ONETOUCH ULTRA test strip CHECK BLOOD SUGAR DAILY   sildenafil (VIAGRA) 100 MG tablet TAKE 1 TABLET BY MOUTH DAILY AS NEEDED   SYNJARDY XR 25-1000 MG TB24 Take 1 tablet by mouth daily.   tadalafil (CIALIS) 5 MG tablet Take 5 mg by mouth daily as needed for erectile dysfunction.    Testosterone 30 MG/ACT SOLN Place onto the skin daily.   No facility-administered encounter medications on file as of 06/26/2021.    Patient Active Problem List   Diagnosis Date Noted   Chest pain of uncertain etiology 96/22/2979   Prostate cancer ([place]) 09/20/2020   Erectile dysfunction 09/15/2019   Nonocclusive coronary atherosclerosis of native coronary artery 05/20/2019   Coronary artery calcification seen on CT scan 05/20/2019   Type 2 diabetes mellitus without obesity ([place]) 03/25/2019   PVC's (premature ventricular contractions) 03/25/2019   Elevated blood pressure reading in office with white coat syndrome, without diagnosis of hypertension 03/25/2019   Pure hypercholesterolemia 03/25/2019   History of esophageal stricture 02/01/2015   Special screening for malignant neoplasms, colon 10/15/2010   HEMORRHOIDS-EXTERNAL 09/15/2008   HEMORRHOIDS 09/15/2008   GERD 09/15/2008    Conditions to be addressed/monitored:CAD, HTN, HLD, and DMII  Care Plan : RNCM:Diabetes Type 2 (Adult)  Updates made by Dimitri Ped, RN since 06/26/2021 12:00 AM  Completed 06/26/2021   Problem: Glycemic Management (Diabetes, Type 2) Resolved  06/26/2021  Priority: High     Long-Range Goal: Glycemic Management Optimized Completed 06/26/2021  Start Date: 10/03/2020  Expected End Date: 09/12/2021  Recent Progress: On track  Priority: High  Note:   Resolving due to duplicate goal  Objective:  Lab Results  Component Value Date   HGBA1C 6.1 (A) 09/20/2020   Lab  Results  Component Value Date   CREATININE 0.74 06/22/2020   CREATININE 0.74 01/27/2020   CREATININE 0.78 04/28/2019   No results found for: EGFR Current Barriers:  Knowledge Deficits related to basic Diabetes pathophysiology and self care/management in pt with other chronic conditions of HLD and HTN Knowledge Deficits related to medications used for management of diabetes Does not adhere to provider recommendations re: diet and exercise reports CBG checks every 1-2 weeks range from  fasting in AM, denies any low readings, states he has been trying to eat healthy, reports doing yard work and helping his son farm for exercise, States he is also bowling 2 times a week.  states his Hemoglobin A1C was  to 5.9% when he saw his doctor on 03/27/21.  States he is now in the doughnut hole so his doctor is switching his Synjardy to Metformin and Jardiance until the start of next year Case Manager Clinical Goal(s):  patient will demonstrate improved adherence to prescribed treatment plan for diabetes self care/management as evidenced by: daily monitoring and recording of CBG  adherence to ADA/ carb modified diet exercise 3 days/week adherence to prescribed medication regimen contacting provider for new or worsened symptoms or questions Interventions:  Collaboration with Isaac Bliss, Rayford Halsted, MD regarding development and update of comprehensive plan of care as evidenced by provider attestation and co-signature Inter-disciplinary care team collaboration (see longitudinal plan of care) Reviewed education to patient about basic DM disease process Reviewed medications with patient and discussed importance of medication adherence Discussed plans with patient for ongoing care management follow up and provided patient with direct contact information for care management team Reviewed scheduled/upcoming provider appointments including:no appointments scheduled  Reinforced to check cbg 3-4 times a week and  record, calling primary care  provider  for findings outside established parameters.   Review of patient status, including review of consultants reports, relevant laboratory and other test results, and medications completed. Reinforced s/sx of hypoglycemia and the rule of 15 to treat hypoglycemia Reinforced to continue to try limiting drinking sweet tea and concentrated sweets Reviewed importance of drinking adequate amounts of fluids when working or exercising outside in the heat especially  Reviewed to take Metformin and Jardiance when he is out of the [employer] - Self administers oral medications as prescribed Attends all scheduled provider appointments Checks blood sugars as prescribed and utilize hyper and hypoglycemia protocol as needed Adheres to prescribed ADA/carb modified Patient Goals: - check blood sugar at prescribed times - check blood sugar if I feel it is too high or too low - enter blood sugar readings and medication or insulin into daily log - take the blood sugar log to all doctor visits - change to whole grain breads, cereal, pasta - drink 6 to 8 glasses of water each day - fill half of plate with vegetables - read food labels for fat, fiber, carbohydrates and portion size - switch to sugar-free drinks - keep appointment with eye doctor - check feet daily for cuts, sores or redness -continue to try drinking half and half sweet tea 4 times a week instead of sweet tea Follow Up  Plan: Telephone follow up appointment with care management team member scheduled for: 06/26/21 at 10 AM The patient has been provided with contact information for the care management team and has been advised to call with any health related questions or concerns.      Care Plan : RNCM:Hypertension (Adult)  Updates made by Dimitri Ped, RN since 06/26/2021 12:00 AM  Completed 06/26/2021   Problem: Hypertension (Hypertension) Resolved 06/26/2021  Priority: Medium      Long-Range Goal: Hypertension Monitored Completed 06/26/2021  Start Date: 10/03/2020  Expected End Date: 09/12/2021  Recent Progress: On track  Priority: Medium  Note:   Resolving due to duplicate goal  Objective:  Last practice recorded BP readings:  BP Readings from Last 3 Encounters:  09/20/20 140/80  08/30/20 130/80  08/21/20 133/83   Most recent eGFR/CrCl: No results found for: EGFR  No components found for: CRCL Current Barriers:  Knowledge Deficits related to basic understanding of hypertension pathophysiology and self care management Knowledge Deficits related to understanding of medications prescribed for management of hypertension Does not adhere to provider recommendations re: diet eats out frequently Reports B/P was 118/74  when lasted checked at home, reports he checks 2-3 times a week in the evening when he is relaxed, reports he does yard work and helps his son farm for exercise, reports trying to follow a low sodium diet most of the time, denies any chest pains or shortness or breath Case Manager Clinical Goal(s):  patient will verbalize understanding of plan for hypertension management patient will attend all scheduled medical appointments: no upcoming appointments patient will demonstrate improved health management independence as evidenced by checking blood pressure as directed and notifying PCP if SBP>160 or DBP > 90, taking all medications as prescribe, and adhering to a low sodium diet as discussed. Interventions:  Collaboration with Isaac Bliss, Rayford Halsted, MD regarding development and update of comprehensive plan of care as evidenced by provider attestation and co-signature Inter-disciplinary care team collaboration (see longitudinal plan of care) Evaluation of current treatment plan related to hypertension self management and patient's adherence to plan as established by provider. Reinforced education to patient on stroke prevention, s/s of heart attack and  stroke, DASH diet, complications of uncontrolled blood pressure Reviewed medications with patient and discussed importance of compliance Discussed plans with patient for ongoing care management follow up and provided patient with direct contact information for care management team Reinforced to monitor blood pressure daily and record, calling PCP for findings outside established parameters.  Encouraged to try checking B/P at different times  Reviewed scheduled/upcoming provider appointments including: no upcoming appointments Self-Care Activities: - Self administers medications as prescribed Attends all scheduled provider appointments Calls provider office for new concerns, questions, or BP outside discussed parameters Checks BP and records as discussed Follows a low sodium diet/DASH diet Patient Goals: - check blood pressure 3 times per week - choose a place to take my blood pressure (home, clinic or office, retail store) - write blood pressure results in a log or diary - ask questions to understand - learn about high blood pressure -take B/P log to provider visits Follow Up Plan: Telephone follow up appointment with care management team member scheduled for: 06/26/21 at 10 AM The patient has been provided with contact information for the care management team and has been advised to call with any health related questions or concerns.      Care Plan : RN Care Manager Plan of Care  Updates made by Henefer,  Marland Kitchen, RN since 06/26/2021 12:00 AM     Problem: Chronic Disease Management and Care Coordination Needs (DM,CAD,HTN and HLD)   Priority: High     Long-Range Goal: Establish Plan of Care for Chronic Disease Management Needs (DM,CAD,HTN and HLD)   Start Date: 06/26/2021  Expected End Date: 12/23/2021  Priority: High  Note:   Current Barriers:  Chronic Disease Management support and education needs related to CAD, HTN, HLD, and DMII  States he is to have knee replacement  surgery on his lt knee on 08/09/21.  States he saw his cardiologist to get clearance. States he is back on Staves as the metformin upset his stomach and his blood sugars were higher.  States his CBGs were 130-149 when he was on Metformin and has now gone back to around 113 since he switched back to the [facility].  States he has been deer hunting and bowling twice a week for exercise.  States his B/P has been good when checked.  Denies any chest pains, shortness of breath or swelling.  States has had more sweets over the holidays and he does drink sweet tea about every other day.  RNCM Clinical Goal(s):  Patient will verbalize understanding of plan for management of CAD, HTN, HLD, and DMII as evidenced by voiced adherence to plan of care verbalize basic understanding of  CAD, HTN, HLD, and DMII disease process and self health management plan as evidenced by voiced understanding and teach back take all medications exactly as prescribed and will call provider for medication related questions as evidenced by dispense report and pt verbalization attend all scheduled medical appointments: Dr. Jerilee Hoh 06/26/21, Cardiology 12/24/21 as evidenced by medical records demonstrate Improved adherence to prescribed treatment plan for CAD, HTN, HLD, and DMII as evidenced by readings within limits, voiced adherence to plan of care  through collaboration with RN Care manager, provider, and care team.   Interventions: 1:1 collaboration with primary care provider regarding development and update of comprehensive plan of care as evidenced by provider attestation and co-signature Inter-disciplinary care team collaboration (see longitudinal plan of care) Evaluation of current treatment plan related to  self management and patient's adherence to plan as established by provider   CAD Interventions: (Status:  Goal on track:  Yes.) Long Term Goal Assessed understanding of CAD diagnosis Medications reviewed including  medications utilized in CAD treatment plan Provided education on importance of blood pressure control in management of CAD Provided education on Importance of limiting foods high in cholesterol Counseled on importance of regular laboratory monitoring as prescribed Counseled on the importance of exercise goals with target of 150 minutes per week Reviewed Importance of taking all medications as prescribed Reviewed Importance of attending all scheduled provider appointments Advised to report any changes in symptoms or exercise tolerance   Diabetes Interventions:  (Status:  Goal on track:  Yes.) Long Term Goal Assessed patient's understanding of A1c goal: <6.5% Provided education to patient about basic DM disease process Reviewed medications with patient and discussed importance of medication adherence Counseled on importance of regular laboratory monitoring as prescribed Discussed plans with patient for ongoing care management follow up and provided patient with direct contact information for care management team Advised patient, providing education and rationale, to check cbg 1-2 times a week and record, calling provider for findings outside established parameters Review of patient status, including review of consultants reports, relevant laboratory and other test results, and medications completed Reviewed to try to cut out sweet tea by starting by  getting 1/2 sweet/un sweet or try using artifical sweetener instead Lab Results  Component Value Date   HGBA1C 5.9 (A) 03/27/2021   Hyperlipidemia Interventions:  (Status:  Goal on track:  Yes.) Long Term Goal Medication review performed; medication list updated in electronic medical record.  Provider established cholesterol goals reviewed Counseled on importance of regular laboratory monitoring as prescribed Reviewed role and benefits of statin for ASCVD risk reduction  Hypertension Interventions:  (Status:  Goal on track:  Yes.) Long Term  Goal Last practice recorded BP readings:  BP Readings from Last 3 Encounters:  06/22/21 122/82  03/27/21 140/80  12/22/20 140/80  Most recent eGFR/CrCl: No results found for: EGFR  No components found for: CRCL  Evaluation of current treatment plan related to hypertension self management and patient's adherence to plan as established by provider Provided education to patient re: stroke prevention, s/s of heart attack and stroke Counseled on the importance of exercise goals with target of 150 minutes per week Discussed plans with patient for ongoing care management follow up and provided patient with direct contact information for care management team Advised patient, providing education and rationale, to monitor blood pressure daily and record, calling PCP for findings outside established parameters  Patient Goals/Self-Care Activities: Take all medications as prescribed Attend all scheduled provider appointments Call pharmacy for medication refills 3-7 days in advance of running out of medications Perform all self care activities independently  Perform IADL's (shopping, preparing meals, housekeeping, managing finances) independently Call provider office for new concerns or questions  keep appointment with eye doctor check blood sugar at prescribed times: when you have symptoms of low or high blood sugar and 1-2 times a week check feet daily for cuts, sores or redness take the blood sugar log to all doctor visits drink 6 to 8 glasses of water each day fill half of plate with vegetables manage portion size prepare main meal at home 3 to 5 days each week switch to sugar-free drinks wash and dry feet carefully every day check blood pressure weekly choose a place to take my blood pressure (home, clinic or office, retail store) take blood pressure log to all doctor appointments keep all doctor appointments eat more whole grains, fruits and vegetables, lean meats and healthy fats limit  salt intake to 2345m/day call for medicine refill 2 or 3 days before it runs out take all medications exactly as prescribed call doctor with any symptoms you believe are related to your medicine  Follow Up Plan:  Telephone follow up appointment with care management team member scheduled for:  08/28/21 The patient has been provided with contact information for the care management team and has been advised to call with any health related questions or concerns.       Plan:Telephone follow up appointment with care management team member scheduled for:  08/28/21 The patient has been provided with contact information for the care management team and has been advised to call with any health related questions or concerns.  MPeter GarterRN, BJackquline Denmark CDE Care Management Coordinator [facility] [phone] Mobile ([phone]

## 2021-06-26 NOTE — Progress Notes (Signed)
Established Patient Office Visit     This visit occurred during the SARS-CoV-2 public health emergency.  Safety protocols were in place, including screening questions prior to the visit, additional usage of staff PPE, and extensive cleaning of exam room while observing appropriate contact time as indicated for disinfecting solutions.    CC/Reason for Visit: 73-month follow-up chronic medical conditions  HPI: Jeff Jenkins is a 65 y.o. male who is coming in today for the above mentioned reasons. Past Medical History is significant for: Type 2 diabetes, hyperlipidemia, GERD, coronary artery disease.  He receives monthly injections for B12 and is due today.  He is doing well and has no acute concerns.  He is scheduled to have a left knee replacement surgery in January.  His flu and COVID booster are updated.   Past Medical/Surgical History: Past Medical History:  Diagnosis Date   Allergy    Arthritis    Broken ankle 2008   Coronary artery disease    Diverticulosis    ED (erectile dysfunction)    Facial fracture ([place]) 1979   GERD (gastroesophageal reflux disease)    Hearing loss in right ear    Hemorrhoids    Hiatal hernia    Hyperlipidemia    Hypertension    Peptic stricture of esophagus    Peptic ulcer disease 1989   Prostate cancer ([place]) 02/2009   Seasonal allergies    Type 2 diabetes mellitus ([place])     Past Surgical History:  Procedure Laterality Date   ANKLE SURGERY Left 2008   CHOLECYSTECTOMY N/A 01/31/2020   Procedure: LAPAROSCOPIC CHOLECYSTECTOMY WITH INTRAOPERATIVE CHOLANGIOGRAM;  Surgeon: Clovis Riley, MD;  Location: [facility];  Service: General;  Laterality: N/A;   FACIAL FRACTURE SURGERY     INGUINAL HERNIA REPAIR Left    KNEE ARTHROSCOPY Left 2013   KNEE ARTHROSCOPY  [id number]   [facility]   PROSTATECTOMY  2011   TONSILLECTOMY     UPPER GASTROINTESTINAL ENDOSCOPY     VASECTOMY      Social History:  reports that he has never smoked. He has  never used smokeless tobacco. He reports current alcohol use of about 12.0 standard drinks per week. He reports that he does not use drugs.  Allergies: No Known Allergies  Family History:  Family History  Problem Relation Age of Onset   Breast cancer Mother    Diabetes Mother    Heart attack Mother    Alcohol abuse Father    Stroke Brother    Colon cancer Neg Hx    Esophageal cancer Neg Hx    Rectal cancer Neg Hx    Stomach cancer Neg Hx      Current Outpatient Medications:    acetaminophen (TYLENOL) 325 MG tablet, Take 650 mg by mouth every 6 (six) hours as needed for moderate pain., Disp: , Rfl:    aspirin EC 81 MG tablet, Take 81 mg by mouth daily. Swallow whole., Disp: , Rfl:    atorvastatin (LIPITOR) 40 MG tablet, TAKE 1 TABLET BY MOUTH EVERY DAY, Disp: 90 tablet, Rfl: 1   azelastine (ASTELIN) 0.1 % nasal spray, Place 1 spray into both nostrils 2 (two) times daily. Use in each nostril as directed, Disp: 30 mL, Rfl: 2   esomeprazole (NEXIUM) 40 MG capsule, TAKE 1 CAPSULE DAILY BEFORE BREAKFAST., Disp: 90 capsule, Rfl: 3   ezetimibe (ZETIA) 10 MG tablet, TAKE ONE TABLET BY MOUTH DAILY, Disp: 90 tablet, Rfl: 1   fexofenadine (ALLEGRA)  180 MG tablet, Take 180 mg by mouth daily., Disp: , Rfl:    Lancets (ONETOUCH ULTRASOFT) lancets, 1 each by Other route daily. Dx E11.9, Disp: 100 each, Rfl: 3   lisinopril (ZESTRIL) 5 MG tablet, Take 1 tablet (5 mg total) by mouth daily., Disp: 90 tablet, Rfl: 1   methocarbamol (ROBAXIN) 500 MG tablet, Take 500 mg by mouth every 6 (six) hours as needed., Disp: , Rfl:    ONETOUCH ULTRA test strip, CHECK BLOOD SUGAR DAILY, Disp: 100 strip, Rfl: 3   sildenafil (VIAGRA) 100 MG tablet, TAKE 1 TABLET BY MOUTH DAILY AS NEEDED, Disp: 10 tablet, Rfl: 11   SYNJARDY XR 25-1000 MG TB24, Take 1 tablet by mouth daily., Disp: 30 tablet, Rfl: 5   tadalafil (CIALIS) 5 MG tablet, Take 5 mg by mouth daily as needed for erectile dysfunction. , Disp: , Rfl:     Testosterone 30 MG/ACT SOLN, Place onto the skin daily., Disp: , Rfl:   Review of Systems:  Constitutional: Denies fever, chills, diaphoresis, appetite change and fatigue.  HEENT: Denies photophobia, eye pain, redness, hearing loss, ear pain, congestion, sore throat, rhinorrhea, sneezing, mouth sores, trouble swallowing, neck pain, neck stiffness and tinnitus.   Respiratory: Denies SOB, DOE, cough, chest tightness,  and wheezing.   Cardiovascular: Denies chest pain, palpitations and leg swelling.  Gastrointestinal: Denies nausea, vomiting, abdominal pain, diarrhea, constipation, blood in stool and abdominal distention.  Genitourinary: Denies dysuria, urgency, frequency, hematuria, flank pain and difficulty urinating.  Endocrine: Denies: hot or cold intolerance, sweats, changes in hair or nails, polyuria, polydipsia. Musculoskeletal: Denies myalgias, back pain, joint swelling, arthralgias and gait problem.  Skin: Denies pallor, rash and wound.  Neurological: Denies dizziness, seizures, syncope, weakness, light-headedness, numbness and headaches.  Hematological: Denies adenopathy. Easy bruising, personal or family bleeding history  Psychiatric/Behavioral: Denies suicidal ideation, mood changes, confusion, nervousness, sleep disturbance and agitation    Physical Exam: Vitals:   06/26/21 1038  BP: 130/80  Pulse: 82  Temp: 98.1 F (36.7 C)  TempSrc: Oral  SpO2: 98%  Weight: 171 lb 3.2 oz (77.7 kg)  Height: 6' (1.829 m)    Body mass index is 23.22 kg/m.   Constitutional: NAD, calm, comfortable Eyes: PERRL, lids and conjunctivae normal ENMT: Mucous membranes are moist.  Respiratory: clear to auscultation bilaterally, no wheezing, no crackles. Normal respiratory effort. No accessory muscle use.  Cardiovascular: Regular rate and rhythm, no murmurs / rubs / gallops. No extremity edema.  Neurologic: Grossly intact and nonfocal Psychiatric: Normal judgment and insight. Alert and oriented  x 3. Normal mood.    Impression and Plan:  B12 deficiency  - Plan: cyanocobalamin ((VITAMIN B-12)) injection 1,000 mcg  Type 2 diabetes mellitus without obesity ([place])  - Plan: POCT glycosylated hemoglobin (Hb A1C)  Pure hypercholesterolemia -Lipids are at goal.  Nonocclusive coronary atherosclerosis of native coronary artery -Stable, followed by cardiology.  Time spent: 30 minutes reviewing chart, interviewing and examining patient and formulating plan of care.    Lelon Frohlich, MD [facility]

## 2021-06-27 DIAGNOSIS — M1712 Unilateral primary osteoarthritis, left knee: Secondary | ICD-10-CM | POA: Diagnosis not present

## 2021-06-27 DIAGNOSIS — Z96651 Presence of right artificial knee joint: Secondary | ICD-10-CM | POA: Diagnosis not present

## 2021-07-20 DIAGNOSIS — X32XXXD Exposure to sunlight, subsequent encounter: Secondary | ICD-10-CM | POA: Diagnosis not present

## 2021-07-20 DIAGNOSIS — L820 Inflamed seborrheic keratosis: Secondary | ICD-10-CM | POA: Diagnosis not present

## 2021-07-20 DIAGNOSIS — L570 Actinic keratosis: Secondary | ICD-10-CM | POA: Diagnosis not present

## 2021-07-20 DIAGNOSIS — Z08 Encounter for follow-up examination after completed treatment for malignant neoplasm: Secondary | ICD-10-CM | POA: Diagnosis not present

## 2021-07-20 DIAGNOSIS — Z85828 Personal history of other malignant neoplasm of skin: Secondary | ICD-10-CM | POA: Diagnosis not present

## 2021-07-31 ENCOUNTER — Ambulatory Visit (INDEPENDENT_AMBULATORY_CARE_PROVIDER_SITE_OTHER): Payer: Medicare Other

## 2021-07-31 DIAGNOSIS — E230 Hypopituitarism: Secondary | ICD-10-CM | POA: Diagnosis not present

## 2021-07-31 DIAGNOSIS — Z8546 Personal history of malignant neoplasm of prostate: Secondary | ICD-10-CM | POA: Diagnosis not present

## 2021-07-31 DIAGNOSIS — E538 Deficiency of other specified B group vitamins: Secondary | ICD-10-CM | POA: Diagnosis not present

## 2021-07-31 LAB — TESTOSTERONE: Testosterone: 637.3

## 2021-07-31 LAB — PSA: PSA: <0.015

## 2021-07-31 MED ORDER — CYANOCOBALAMIN 1000 MCG/ML IJ SOLN
1000.0000 ug | INTRAMUSCULAR | Status: AC
  Administered 2021-07-31 – 2021-08-31 (×2): 1000 ug via INTRAMUSCULAR

## 2021-07-31 NOTE — Progress Notes (Signed)
Pt here for monthly B12 injection per Dr Jerilee Hoh.  B12 1082mcg given IM in left deltoid and pt tolerated injection well.  Next B12 injection scheduled for 08/31/21.

## 2021-08-02 ENCOUNTER — Encounter: Admit: 2021-08-02 | Discharge: 2021-08-02 | Payer: MEDICARE

## 2021-08-02 DIAGNOSIS — I480 Paroxysmal atrial fibrillation: Secondary | ICD-10-CM

## 2021-08-02 NOTE — Progress Notes
Cardiac Navigation Intake Assessment Document      LAAO Operator:  [place]    Patient Name:  Tommy Miller  MRN:  [id number]  DOB:  [birth year]  Insurance:   Payor: MEDICARE / Plan: MEDICARE PART A AND B / Product Type: Medicare /   Primary contact for patient: self    Appointment Info:   Future Appointments   Date Time Provider Department Center   08/17/2021  9:30 AM Lorain Childes, MD [facility] C[facility] Exam   08/17/2021  1:20 PM Reola Mosher, MD [facility] CVM Exam   10/30/2021  8:30 AM Dagg, Ali Lowe, MD [facility]       Diagnosis and Reason for Visit:  Watchman consult  hemorrhoidal bleeding  Dr. Derrell Lolling would like to coordinate Watchman and ablation, holding 11/12/2021 date    Physician Info:   ? Referring Provider:   Dr. Derrell Lolling  ? Cardiologist:   n/a  ? EP:   Dr. Derrell Lolling  ? PCP:   Dr. Vivia Birmingham  ? GI:   n/a  ? Neuro:   n/a  ? Other:   n/a    LAAO Indication:   History of Bleeding or increased risk of bleeding    CHA2DS2VASc Score:     HTN (1) and 65-74 (1)  CHA2DS2VASc Yearly Stroke Risk :    2=2.2%    HAS-BLED Score:    HTN (1), Bleeding History (1) and Elderly >65 (1)  HAS-BLED Yearly Risk:    3=3.74%    LAAO Exclusion Criteria:   n/a    Current Anticoagulation:   Eliquis (Apixaban)    Current Antiplatelet:   None    Able to take Aspirin:  YES  Able to take Plavix:  YES  Able to take short term Warfarin:  YES    History of Present Illness:      Atrial Fibrillation Classification: Paroxysmal      AF Treatment Medication Mgmt         Device none                NYHA Class:   n/a    Obstructive Sleep Apnea:  YES  Treatment:   CPAP        Medical history (pertinent to LAAO workup) :  Paroxysmal Afib, HTN, OSA w/CPAP, Hemorrdal Bleeding, HLD, Diverticulosis, Barretts Esophagus      Surgery/Procedure history (pertinent to LAAO workup):  n/a        NEEDS Assessment:                             ? Social Work/Financial:  No need identified                      ? Physical:  No needs identified ? Communication:  No needs identified      Plan:  Patient is scheduled for Watchman consult with Dr. Micheline Rough as telephone visit on 08/17/2021    Comments:  Patient updated on current plan. Determined the best date for an appointment. Educated to all appointment information.

## 2021-08-03 DIAGNOSIS — M1712 Unilateral primary osteoarthritis, left knee: Secondary | ICD-10-CM | POA: Diagnosis not present

## 2021-08-08 DIAGNOSIS — Z8546 Personal history of malignant neoplasm of prostate: Secondary | ICD-10-CM | POA: Diagnosis not present

## 2021-08-08 DIAGNOSIS — N393 Stress incontinence (female) (male): Secondary | ICD-10-CM | POA: Diagnosis not present

## 2021-08-08 DIAGNOSIS — E230 Hypopituitarism: Secondary | ICD-10-CM | POA: Diagnosis not present

## 2021-08-10 ENCOUNTER — Encounter: Payer: Self-pay | Admitting: Internal Medicine

## 2021-08-17 ENCOUNTER — Encounter: Admit: 2021-08-17 | Discharge: 2021-08-17 | Payer: MEDICARE

## 2021-08-17 ENCOUNTER — Ambulatory Visit: Admit: 2021-08-17 | Discharge: 2021-08-18 | Payer: MEDICARE

## 2021-08-17 DIAGNOSIS — I480 Paroxysmal atrial fibrillation: Secondary | ICD-10-CM

## 2021-08-17 DIAGNOSIS — K648 Other hemorrhoids: Secondary | ICD-10-CM

## 2021-08-17 MED ORDER — PANTOPRAZOLE 40 MG PO TBEC
40 mg | ORAL_TABLET | Freq: Two times a day (BID) | ORAL | 0 refills | 90.00000 days | Status: AC
Start: 2021-08-17 — End: ?

## 2021-08-17 NOTE — Progress Notes
Telehealth Visit Note    Date of Service: 08/17/2021    Subjective:           Tommy Miller is a 66 y.o. male.    History of Present Illness  I saw Mr. Tommy Miller via telemedicine.  Dr. Lorain Childes referred Mr. Tommy Miller to me for consideration of left atrial appendage closure.  Tommy Miller is a 66 year old gentleman who has a past medical history of hypertension, obstructive sleep apnea, obesity and paroxysmal atrial fibrillation.  The atrial fibrillation was diagnosed in 2022 during a very stressful period of time when he was caring for his ill father.  He was having episodes up to 14 hours in duration with an overall burden of 4%.  He was placed on Xarelto.  He was scheduled to undergo pulmonary vein isolation but he developed hemorrhoidal bleeding and ablation was canceled.  He is back on his usual dose of Eliquis.  He saw Tommy Miller in follow-up and they would like to arrange for ablation and appendage occlusion to be done the same day. His CHADS2-VASc score is 2. (hypertension (1), age > 23 (1)).         Review of Systems   All other systems reviewed and are negative.      .  Objective:         ? amLODIPine (NORVASC) 10 mg tablet Take one tablet by mouth daily.   ? apixaban (ELIQUIS) 5 mg tablet Take one tablet by mouth twice daily.   ? atorvastatin (LIPITOR) 40 mg tablet Take one tablet by mouth at bedtime daily.   ? dronedarone (MULTAQ) 400 mg tablet Take one tablet by mouth twice daily with meals.   ? fluticasone propionate (FLONASE) 50 mcg/actuation nasal spray, suspension Apply two sprays to each nostril as directed daily. Shake bottle gently before using.   ? metoprolol succinate XL (TOPROL XL) 100 mg extended release tablet Take 1 tab in am, 1 tab in pm   ? metroNIDAZOLE (METROGEL) 1 % topical gel Apply one g topically to affected area twice daily.   ? minocycline (MINOCIN) 100 mg capsule Take one capsule by mouth daily. (Patient taking differently: Take 50 mg by mouth daily.)   ? omeprazole DR (PRILOSEC) 20 mg capsule Take one capsule by mouth daily before breakfast.   ? [START ON 10/29/2021] pantoprazole DR (PROTONIX) 40 mg tablet Take one tablet by mouth twice daily for 45 days. Take one tablet by mouth twice daily for 7 days prior to procedure (11/12/21) then take one tablet by mouth twice daily for 30 days after procedure.   ? phenylephrine-cocoa butter (PREPARATION H(PE,CB)) 0.25-88.44 % rectal suppository Insert or Apply one suppository to rectal area as directed every 12 hours. Indications: hemorrhoids          Telehealth Patient Reported Vitals     Row Name 08/17/21 1319                BP: 130/92        BP Source: Arm, Left Upper        BP Position: Sitting        Weight: 109.3 kg (241 lb)        Height: 180.3 cm (5' 11)        Pain Score: Zero                  Telehealth Body Mass Index: 33.61 at 08/17/2021  1:26 PM    Physical Exam  He is alert and  oriented  Facial movements are symmetric   Appears to be breathing normally and is in no acute distress        Assessment and Plan:  Atrial fibrillation.  He does have a CHA2DS2-VASc score of 2 and has paroxysmal atrial fibrillation.  He has had multiple episodes of gastrointestinal/hemorrhoidal bleeding with significant anemia.  I would agree that he would benefit from left atrial appendage closure.  He tells me he will have a CT scan done in April which will be helpful in looking at his left atrial appendage as well as his pulmonary venous system. I explained the role of the left atrial appendage in cardioembolic strokes in the setting of atrial fibrillation and atrial flutter. I discussed left atrial appendage occlusion as a strategy for both stroke and bleeding prevention. I explained the randomized data regarding the WATCHMAN device, decreased risk of bleeding complications, with similar risk reduction of embolic strokes, and described the procedure in detail, which include but are not limited to the risk of femoral vein bleeding, pericardial effusion, need for cardiothoracic surgery, infection, stroke, death, and embolization of the device.    I reviewed an animation of the procedure to help him and his wife understand how it is done and to encourage other questions. I also discussed the fact that we would like oral anticoagulation for 6 weeks following the procedure, followed by 4.5 months of dual antiplatelet therapy. After that, he would be on 81 mg aspirin indefinitely.  I answered their questions and they would like to proceed. Dr Derrell Lolling and Jene Every and Dr Dagg (by Mr. Archey report) all feel that left atrial appendage occlusion may be a better option than long-term anticoagulation on this patient if it can be done safely and the patient can come off of anticoagulation.    The shared decision-making tool was also made available to them.                         30 minutes spent on this patient's encounter with counseling and coordination of care taking >50% of the visit.

## 2021-08-17 NOTE — Patient Instructions
ELECTROPHYSIOLOGY PRE-ADMISSION INSTRUCTIONS  Atrial Fibrillation Ablation    Patient Name: Tommy Miller  MRN#: [id number]  Date of Birth: 01/03/1956 (66 y.o.)  Today's Date: 08/17/2021    PROCEDURE:  You are scheduled for possible Transesophageal Echocardiogram followed by  Electrophysiology Study and Radiofrequency Ablation of your Atrial Fibrillation + Watchman with Dr. Lorain Childes & Dr. Micheline Rough.    ARRIVAL TIME:  Please report to the [facility] admitting office on the ground floor of the [facility] on: 11/12/21    The [facility] is located at [address] [place], [place] [address]. Park in [employer] and enter through the  Main front doors to the hospital. The Heart Center is located on the right-hand side as soon as you walk in.    The EP Lab will call to notify you of your arrival time.  They will call on the business day prior to your procedure.  (If you have any questions regarding your arrival time for the Electrophysiology Lab, please call the EP Lab at [phone].)    PRE-PROCEDURE APPOINTMENTS:  TBD Office visit to update history and physical (requirement within 30 days of procedure)  with Dr. Lorain Childes at [facility]      TBD Pre-Operative Assessment Clinic visit with Anesthesia Department.  Go to the main hospital entrance of the [facility]. The anesthesia clinic is just inside the entrance on the left side of lobby -  across from the Information Desk.     TBD CT scan of your heart is planned. They will send separate instructions to you in the mail.     TBD Pre-Admission lab work: BMP, CBC, and Magnesium at your Pre-Operative Assessment Visit.     SPECIAL MEDICATION INSTRUCTIONS  Nothing to eat or drink after midnight. You may take approved medications with a small sip of water on the morning of the procedure.     You will be changing your omeprazole to a prescription of Protonix (pantoprazole sodium) 40mg  twice daily one week prior to the procedure and for 30 days after your procedure. Once completed, you can resume your omeprazole as prescribed.     Any new prescriptions will be sent to your pharmacy listed on file with [place].   HOLD ALL over the counter vitamins or supplements on the morning of your procedure.  Antiarrythmics: Multaq (dronedarone) -- hold for 72 hours prior to your procedure. LAST DOSE: 11/08/21  Other: metoprolol XL -- hold for 72 hours prior to your procedure. LAST DOSE: 11/08/21  Anticoagulants: Do not take apixaban (Eliquis) on the morning of your procedure. --- DO NOT MISS ANY OTHER DOSES ---  If you miss any doses of apixaban (Eliquis) prior to your procedure, please contact our office. You may require a transesophageal echo prior to your procedure.       Additional Instructions  If you wear CPAP, please bring your mask and machine with you to the hospital.    Take a bath or shower with anti-bacterial soap the evening before, or the morning of the procedure. We will give this to you at your office visit.     Bring photo ID and your health insurance card(s).    Arrange for a driver to take you home from the hospital.    Bring an accurate list of your current medications with you to the hospital (all meds and supplements taken daily).    Wear comfortable clothes and don't bring  valuables, other than photo identification card, with you to the hospital.    Please pack a bag for an overnight stay.     You will have a Foley Catheter placed in your bladder during the procedure that will be removed once you are off bedrest after the procedure.     Your groins will be prepped with clippers in the hospital to prep the access site for your procedure.    You may have an access site on the right side of your neck. You may wake up post procedure with a bulky dressing in this area.    It is common for your heart to go in and out of atrial fibrillation for up to three months. If you are having symptoms of Atrial Fibrillation longer than 6 hours or are having new symptoms with an irregular rhythm, please call the office. If it lasts more than 24 hours you may need a Cardioversion.     Please review your pre-procedure instructions and call the office at [phone] with any questions. You may ask to speak with any of Dr. Irwin Brakeman Ramirez's nurses. There are several of [place] in the office who can assist you. You will have a 1 week follow up phone call by one of Dr. Irwin Brakeman Ramirez's nurses just to check in with you.     ALLERGIES  Not on File    CURRENT MEDICATIONS  Outpatient Encounter Medications as of 08/17/2021   Medication Sig Dispense Refill    amLODIPine (NORVASC) 10 mg tablet Take one tablet by mouth daily. 90 tablet 1    apixaban (ELIQUIS) 5 mg tablet Take one tablet by mouth twice daily. 180 tablet 3    atorvastatin (LIPITOR) 40 mg tablet Take one tablet by mouth at bedtime daily. 90 tablet 3    dronedarone (MULTAQ) 400 mg tablet Take one tablet by mouth twice daily with meals. 180 tablet 1    fluticasone propionate (FLONASE) 50 mcg/actuation nasal spray, suspension Apply two sprays to each nostril as directed daily. Shake bottle gently before using. 16 g 4    metoprolol succinate XL (TOPROL XL) 100 mg extended release tablet Take 1 tab in am, 1 tab in pm 90 tablet 1    metroNIDAZOLE (METROGEL) 1 % topical gel Apply one g topically to affected area twice daily. 60 g 11    minocycline (MINOCIN) 100 mg capsule Take one capsule by mouth daily. (Patient taking differently: Take 50 mg by mouth daily.) 90 capsule 3    omeprazole DR (PRILOSEC) 20 mg capsule Take one capsule by mouth daily before breakfast. 90 capsule 1    phenylephrine-cocoa butter (PREPARATION H(PE,CB)) 0.25-88.44 % rectal suppository Insert or Apply one suppository to rectal area as directed every 12 hours. Indications: hemorrhoids 12 each 0     No facility-administered encounter medications on file as of 08/17/2021. _________________________________________  Form completed by: Lesli Albee, RN  Date completed: 08/17/21  Method: Via MyChart.

## 2021-08-20 ENCOUNTER — Encounter: Admit: 2021-08-20 | Discharge: 2021-08-20 | Payer: MEDICARE

## 2021-08-20 DIAGNOSIS — I480 Paroxysmal atrial fibrillation: Secondary | ICD-10-CM

## 2021-08-20 MED ORDER — CEFAZOLIN INJ 1GM IVP
1 g | INTRAVENOUS | 0 refills
Start: 2021-08-20 — End: ?

## 2021-08-20 MED ORDER — SODIUM CHLORIDE 0.9 % IV SOLP
1000 mL | INTRAVENOUS | 0 refills
Start: 2021-08-20 — End: ?

## 2021-08-20 MED ORDER — CEFAZOLIN INJ 1GM IVP
2 g | Freq: Once | INTRAVENOUS | 0 refills
Start: 2021-08-20 — End: ?

## 2021-08-20 MED ORDER — ASPIRIN 325 MG PO TAB
325 mg | Freq: Once | ORAL | 0 refills
Start: 2021-08-20 — End: ?

## 2021-08-20 NOTE — Telephone Encounter
Called patient to schedule Watchman implant with Dr. Micheline Rough in addition to the ablation with Dr. Derrell Lolling  Patient agreed to date of service, 11/12/2021  Pre procedure appointments discussed.  Patient verbalized understanding of dates/times/locations of all appointments.  Pre procedure instructions sent via MyChart per request.

## 2021-08-20 NOTE — Telephone Encounter
-----   Message from Tempie Hoist, BSN sent at 08/20/2021 10:51 AM CST -----  Regarding: CCTA appointment request  Please schedule CCTA for 10/30/2021 at 1300 please.  Patient has other pre procedure appointments starting at 1400.  Thanks    .ss

## 2021-08-28 ENCOUNTER — Ambulatory Visit (INDEPENDENT_AMBULATORY_CARE_PROVIDER_SITE_OTHER): Payer: Medicare Other

## 2021-08-28 DIAGNOSIS — E7800 Pure hypercholesterolemia, unspecified: Secondary | ICD-10-CM

## 2021-08-28 DIAGNOSIS — I2510 Atherosclerotic heart disease of native coronary artery without angina pectoris: Secondary | ICD-10-CM

## 2021-08-28 DIAGNOSIS — E119 Type 2 diabetes mellitus without complications: Secondary | ICD-10-CM

## 2021-08-28 DIAGNOSIS — R030 Elevated blood-pressure reading, without diagnosis of hypertension: Secondary | ICD-10-CM

## 2021-08-28 NOTE — Chronic Care Management (AMB) (Signed)
Chronic Care Management   CCM RN Visit Note  08/28/2021 Name: [name] MRN: [id number] DOB: 12/26/1955  Subjective: Jeff Jenkins is a 66 y.o. year old male who is a primary care patient of Isaac Bliss, Rayford Halsted, MD. The care management team was consulted for assistance with disease management and care coordination needs.    Engaged with patient by telephone for follow up visit in response to provider referral for case management and/or care coordination services.   Consent to Services:  The patient was given information about Chronic Care Management services, agreed to services, and gave verbal consent prior to initiation of services.  Please see initial visit note for detailed documentation.   Patient agreed to services and verbal consent obtained.   Assessment: Review of patient past medical history, allergies, medications, health status, including review of consultants reports, laboratory and other test data, was performed as part of comprehensive evaluation and provision of chronic care management services.   SDOH (Social Determinants of Health) assessments and interventions performed:  SDOH Interventions    Flowsheet Row Most Recent Value  SDOH Interventions   Intimate Partner Violence Interventions Intervention Not Indicated  Social Connections Interventions Intervention Not Indicated        CCM Care Plan  No Known Allergies  Outpatient Encounter Medications as of 08/28/2021  Medication Sig   acetaminophen (TYLENOL) 325 MG tablet Take 650 mg by mouth every 6 (six) hours as needed for moderate pain.   aspirin EC 81 MG tablet Take 81 mg by mouth daily. Swallow whole.   atorvastatin (LIPITOR) 40 MG tablet TAKE 1 TABLET BY MOUTH EVERY DAY   azelastine (ASTELIN) 0.1 % nasal spray Place 1 spray into both nostrils 2 (two) times daily. Use in each nostril as directed   esomeprazole (NEXIUM) 40 MG capsule TAKE 1 CAPSULE DAILY BEFORE BREAKFAST.   ezetimibe  (ZETIA) 10 MG tablet TAKE ONE TABLET BY MOUTH DAILY   fexofenadine (ALLEGRA) 180 MG tablet Take 180 mg by mouth daily.   Lancets (ONETOUCH ULTRASOFT) lancets 1 each by Other route daily. Dx E11.9   lisinopril (ZESTRIL) 5 MG tablet Take 1 tablet (5 mg total) by mouth daily.   methocarbamol (ROBAXIN) 500 MG tablet Take 500 mg by mouth every 6 (six) hours as needed.   ONETOUCH ULTRA test strip CHECK BLOOD SUGAR DAILY   sildenafil (VIAGRA) 100 MG tablet TAKE 1 TABLET BY MOUTH DAILY AS NEEDED   SYNJARDY XR 25-1000 MG TB24 Take 1 tablet by mouth daily.   tadalafil (CIALIS) 5 MG tablet Take 5 mg by mouth daily as needed for erectile dysfunction.    Testosterone 30 MG/ACT SOLN Place onto the skin daily.   Facility-Administered Encounter Medications as of 08/28/2021  Medication   cyanocobalamin ((VITAMIN B-12)) injection 1,000 mcg    Patient Active Problem List   Diagnosis Date Noted   Chest pain of uncertain etiology 65/46/5035   Prostate cancer (No Name) 09/20/2020   Erectile dysfunction 09/15/2019   Nonocclusive coronary atherosclerosis of native coronary artery 05/20/2019   Coronary artery calcification seen on CT scan 05/20/2019   Type 2 diabetes mellitus without obesity ([place]) 03/25/2019   PVC's (premature ventricular contractions) 03/25/2019   Elevated blood pressure reading in office with white coat syndrome, without diagnosis of hypertension 03/25/2019   Pure hypercholesterolemia 03/25/2019   History of esophageal stricture 02/01/2015   Special screening for malignant neoplasms, colon 10/15/2010   HEMORRHOIDS-EXTERNAL 09/15/2008   HEMORRHOIDS 09/15/2008   GERD 09/15/2008  Conditions to be addressed/monitored:CAD, HTN, HLD, and DMII  Care Plan : RN Care Manager Plan of Care  Updates made by Dimitri Ped, RN since 08/28/2021 12:00 AM     Problem: Chronic Disease Management and Care Coordination Needs (DM,CAD,HTN and HLD)   Priority: High     Long-Range Goal: Establish Plan  of Care for Chronic Disease Management Needs (DM,CAD,HTN and HLD)   Start Date: 06/26/2021  Expected End Date: 12/23/2021  Priority: High  Note:   Current Barriers:  Chronic Disease Management support and education needs related to CAD, HTN, HLD, and DMII  States he did not have his lt knee surgery as he had a cortisone shot that helped and 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  States he does have double hernias that will need surgery and he is going to see the surgeon nest week.  States he is not doing any heavy lifting. States he is taking the Liberal without any issues.   States his CBGs are ranging from 108-140 with the higher reading after he ate some sweets for a boweling party.  States he has been bowling twice a week  and does yard work Electronics engineer for exercise.  States his B/P has been good when he has checked and it was 121/73 this morning  Denies any chest pains, shortness of breath or swelling.     RNCM Clinical Goal(s):  Patient will verbalize understanding of plan for management of CAD, HTN, HLD, and DMII as evidenced by voiced adherence to plan of care verbalize basic understanding of  CAD, HTN, HLD, and DMII disease process and self health management plan as evidenced by voiced understanding and teach back take all medications exactly as prescribed and will call provider for medication related questions as evidenced by dispense report and pt verbalization attend all scheduled medical appointments: Dr. Jerilee Hoh 09/27/21, Cardiology 12/24/21 as evidenced by medical records demonstrate Improved adherence to prescribed treatment plan for CAD, HTN, HLD, and DMII as evidenced by readings within limits, voiced adherence to plan of care  through collaboration with RN Care manager, provider, and care team.   Interventions: 1:1 collaboration with primary care provider regarding development and update of  comprehensive plan of care as evidenced by provider attestation and co-signature Inter-disciplinary care team collaboration (see longitudinal plan of care) Evaluation of current treatment plan related to  self management and patient's adherence to plan as established by provider   CAD Interventions: (Status:  Goal on track:  Yes.) Long Term Goal Assessed understanding of CAD diagnosis Medications reviewed including medications utilized in CAD treatment plan Provided education on importance of blood pressure control in management of CAD Provided education on Importance of limiting foods high in cholesterol Counseled on importance of regular laboratory monitoring as prescribed Counseled on the importance of exercise goals with target of 150 minutes per week Reviewed Importance of taking all medications as prescribed Reviewed Importance of attending all scheduled provider appointments Advised to report any changes in symptoms or exercise tolerance Reviewed to look into using his Silver Sneakers benefit to go to gym on bad weather days or walk at a big box store for exercise   Diabetes Interventions:  (Status:  Goal on track:  Yes.) Long Term Goal Assessed patient's understanding of A1c goal: <6.5% Provided education to patient about basic DM disease process Reviewed medications with patient and discussed importance of medication adherence Counseled  on importance of regular laboratory monitoring as prescribed Discussed plans with patient for ongoing care management follow up and provided patient with direct contact information for care management team Advised patient, providing education and rationale, to check cbg 1-2 times a week and record, calling provider for findings outside established parameters Review of patient status, including review of consultants reports, relevant laboratory and other test results, and medications completed Reinforced to try to cut out sweet tea by starting by  getting 1/2 sweet/un sweet or try using artifical sweetener instead. Reviewed importance of regular exercise to help with blood sugar control Lab Results  Component Value Date   HGBA1C 6.2 (A) 06/26/2021   Hyperlipidemia Interventions:  (Status:  Goal on track:  Yes.) Long Term Goal Medication review performed; medication list updated in electronic medical record.  Provider established cholesterol goals reviewed Counseled on importance of regular laboratory monitoring as prescribed Reviewed role and benefits of statin for ASCVD risk reduction Reviewed importance of limiting foods high in cholesterol Reviewed exercise goals and target of 150 minutes per week  Hypertension Interventions:  (Status:  Goal on track:  Yes.) Long Term Goal Last practice recorded BP readings:  BP Readings from Last 3 Encounters:  06/26/21 130/80  06/22/21 122/82  03/27/21 140/80  Most recent eGFR/CrCl: No results found for: EGFR  No components found for: CRCL  Evaluation of current treatment plan related to hypertension self management and patient's adherence to plan as established by provider Provided education to patient re: stroke prevention, s/s of heart attack and stroke Counseled on the importance of exercise goals with target of 150 minutes per week Discussed plans with patient for ongoing care management follow up and provided patient with direct contact information for care management team Advised patient, providing education and rationale, to monitor blood pressure daily and record, calling PCP for findings outside established parameters Discussed complications of poorly controlled blood pressure such as heart disease, stroke, circulatory complications, vision complications, kidney impairment, sexual dysfunction  Patient Goals/Self-Care Activities: Take all medications as prescribed Attend all scheduled provider appointments Call pharmacy for medication refills 3-7 days in advance of running out of  medications Perform all self care activities independently  Perform IADL's (shopping, preparing meals, housekeeping, managing finances) independently Call provider office for new concerns or questions  keep appointment with eye doctor check blood sugar at prescribed times: when you have symptoms of low or high blood sugar and 1-2 times a week check feet daily for cuts, sores or redness take the blood sugar log to all doctor visits drink 6 to 8 glasses of water each day fill half of plate with vegetables manage portion size prepare main meal at home 3 to 5 days each week switch to sugar-free drinks wash and dry feet carefully every day Look into Silver Sneakers benefit check blood pressure weekly choose a place to take my blood pressure (home, clinic or office, retail store) take blood pressure log to all doctor appointments keep all doctor appointments eat more whole grains, fruits and vegetables, lean meats and healthy fats limit salt intake to 234m/day call for medicine refill 2 or 3 days before it runs out take all medications exactly as prescribed call doctor with any symptoms you believe are related to your medicine  Follow Up Plan:  Telephone follow up appointment with care management team member scheduled for:  11/27/21 The patient has been provided with contact information for the care management team and has been advised to call with any health related  questions or concerns.       Plan:Telephone follow up appointment with care management team member scheduled for:  11/27/21 The patient has been provided with contact information for the care management team and has been advised to call with any health related questions or concerns.  Peter Garter RN, Jackquline Denmark, CDE Care Management Coordinator [facility] [phone]

## 2021-08-28 NOTE — Patient Instructions (Signed)
Visit Information  Thank you for taking time to visit with me today. Please don't hesitate to contact me if I can be of assistance to you before our next scheduled telephone appointment.  Following are the goals we discussed today:  Take all medications as prescribed Attend all scheduled provider appointments Call pharmacy for medication refills 3-7 days in advance of running out of medications Perform all self care activities independently  Perform IADL's (shopping, preparing meals, housekeeping, managing finances) independently Call provider office for new concerns or questions  keep appointment with eye doctor check blood sugar at prescribed times: when you have symptoms of low or high blood sugar and 1-2 times a week check feet daily for cuts, sores or redness take the blood sugar log to all doctor visits drink 6 to 8 glasses of water each day fill half of plate with vegetables manage portion size prepare main meal at home 3 to 5 days each week switch to sugar-free drinks wash and dry feet carefully every day Look into Silver Sneakers benefit check blood pressure weekly choose a place to take my blood pressure (home, clinic or office, retail store) take blood pressure log to all doctor appointments keep all doctor appointments eat more whole grains, fruits and vegetables, lean meats and healthy fats limit salt intake to 2300mg /day call for medicine refill 2 or 3 days before it runs out take all medications exactly as prescribed call doctor with any symptoms you believe are related to your medicine  Our next appointment is by telephone on 11/27/21 at 10 AM  Please call the care guide team at [phone] if you need to cancel or reschedule your appointment.   If you are experiencing a Mental Health or [place] or need someone to talk to, please call the Suicide and Crisis Lifeline: 988 call the [place] National Suicide Prevention Lifeline: (670)241-0473 or TTY:  314-246-0622 TTY (564)292-6291) to talk to a trained counselor call 1-800-273-TALK (toll free, 24 hour hotline) call 911   Patient verbalizes understanding of instructions and care plan provided today and agrees to view in [place]. Active MyChart status confirmed with patient.   Peter Garter RN, Jackquline Denmark, CDE Care Management Coordinator [facility] [phone]

## 2021-08-29 ENCOUNTER — Encounter: Admit: 2021-08-29 | Discharge: 2021-08-29 | Payer: MEDICARE

## 2021-08-30 ENCOUNTER — Encounter: Admit: 2021-08-30 | Discharge: 2021-08-30 | Payer: MEDICARE

## 2021-08-31 ENCOUNTER — Ambulatory Visit (INDEPENDENT_AMBULATORY_CARE_PROVIDER_SITE_OTHER): Payer: Medicare HMO

## 2021-08-31 DIAGNOSIS — E538 Deficiency of other specified B group vitamins: Secondary | ICD-10-CM | POA: Diagnosis not present

## 2021-08-31 NOTE — Progress Notes (Signed)
Pt here for monthly B12 injection per Jerilee Hoh.  B12 1042mcg given IM right deltoid and pt tolerated injection well.  Pt has 3 month f/u with PCP scheduled for 09/27/21.  Dr [place]ique: please cosign since PCP is out of office.

## 2021-09-04 ENCOUNTER — Ambulatory Visit: Payer: Self-pay | Admitting: Surgery

## 2021-09-04 DIAGNOSIS — Z8546 Personal history of malignant neoplasm of prostate: Secondary | ICD-10-CM | POA: Diagnosis not present

## 2021-09-04 DIAGNOSIS — K4091 Unilateral inguinal hernia, without obstruction or gangrene, recurrent: Secondary | ICD-10-CM | POA: Diagnosis not present

## 2021-09-04 DIAGNOSIS — Z8249 Family history of ischemic heart disease and other diseases of the circulatory system: Secondary | ICD-10-CM | POA: Diagnosis not present

## 2021-09-04 DIAGNOSIS — K4090 Unilateral inguinal hernia, without obstruction or gangrene, not specified as recurrent: Secondary | ICD-10-CM | POA: Diagnosis not present

## 2021-09-04 DIAGNOSIS — K429 Umbilical hernia without obstruction or gangrene: Secondary | ICD-10-CM | POA: Diagnosis not present

## 2021-09-10 ENCOUNTER — Encounter: Payer: Self-pay | Admitting: Internal Medicine

## 2021-09-10 ENCOUNTER — Encounter: Admit: 2021-09-10 | Discharge: 2021-09-10 | Payer: MEDICARE

## 2021-09-11 ENCOUNTER — Telehealth: Payer: Self-pay | Admitting: Internal Medicine

## 2021-09-11 ENCOUNTER — Encounter: Payer: Self-pay | Admitting: Internal Medicine

## 2021-09-11 ENCOUNTER — Other Ambulatory Visit: Payer: Self-pay

## 2021-09-11 ENCOUNTER — Telehealth (INDEPENDENT_AMBULATORY_CARE_PROVIDER_SITE_OTHER): Payer: Medicare HMO | Admitting: Internal Medicine

## 2021-09-11 ENCOUNTER — Other Ambulatory Visit: Payer: Self-pay | Admitting: Gastroenterology

## 2021-09-11 VITALS — Temp 99.8°F | Wt 166.0 lb

## 2021-09-11 DIAGNOSIS — U071 COVID-19: Secondary | ICD-10-CM | POA: Diagnosis not present

## 2021-09-11 DIAGNOSIS — E785 Hyperlipidemia, unspecified: Secondary | ICD-10-CM

## 2021-09-11 DIAGNOSIS — I2510 Atherosclerotic heart disease of native coronary artery without angina pectoris: Secondary | ICD-10-CM

## 2021-09-11 DIAGNOSIS — I10 Essential (primary) hypertension: Secondary | ICD-10-CM

## 2021-09-11 DIAGNOSIS — E1159 Type 2 diabetes mellitus with other circulatory complications: Secondary | ICD-10-CM | POA: Diagnosis not present

## 2021-09-11 MED ORDER — MOLNUPIRAVIR EUA 200MG CAPSULE: 4.0000 | ORAL_CAPSULE | Freq: Two times a day (BID) | ORAL | 0 refills | Status: AC

## 2021-09-11 NOTE — Telephone Encounter (Signed)
Patient calling in with respiratory symptoms: Shortness of breath, chest pain, palpitations or other red words send to Triage  Does the patient have a fever over 100, cough, congestion, sore throat, runny nose, lost of taste/smell (please list symptoms that patient has)?cough,congestion, runny nose  What date did symptoms start?09/08/21 (If over 5 days ago, pt may be scheduled for in person visit)  Have you tested for Covid in the last 5 days? Yes   If yes, was it positive [x]  OR negative [] ? If positive in the last 5 days, please schedule virtual visit now. If negative, schedule for an in person OV with the next available provider if PCP has no openings. Please also let patient know they will be tested again (follow the script below)  "you will have to arrive 85mins prior to your appt time to be Covid tested. Please park in back of office at the [facility] & call [phone] to let the staff know you have arrived. A staff member will meet you at your car to do a rapid covid test. Once the test has resulted you will be notified by phone of your results to determine if appt will remain an in person visit or be converted to a virtual/phone visit. If you arrive less than 60mins before your appt time, your visit will be automatically converted to virtual & any recommended testing will happen AFTER the visit." Pt have a virtual appt with dr.Hernandez today at 11:30  [place]  If no availability for virtual visit in office,  please schedule another [place] office  If no availability at another [place] office, please instruct patient that they can schedule an evisit or virtual visit through their mychart account. Visits up to 8pm  patients can be seen in office 5 days after positive COVID test

## 2021-09-11 NOTE — Progress Notes (Signed)
Virtual Visit via Video Note  I connected with Jeff Jenkins on 09/11/21 at 11:30 AM EST by a video enabled telemedicine application and verified that I am speaking with the correct person using two identifiers.  Location patient: home Location provider: work office Persons participating in the virtual visit: patient, provider  I discussed the limitations of evaluation and management by telemedicine and the availability of in person appointments. The patient expressed understanding and agreed to proceed.   HPI: He has called to inform us that he tested positive for COVID on 2/27.  His symptoms started on Saturday with sore throat, postnasal drip.  By Monday he experienced a severe headache, cough, congestion and rhinorrhea.  No fever or shortness of breath.  On Monday he tested positive.   ROS: Constitutional: Denies fever, chills, diaphoresis. HEENT: Denies photophobia, eye pain, redness, hearing loss, ear pain, congestion, sore throat, rhinorrhea, sneezing, mouth sores, trouble swallowing, neck pain, neck stiffness and tinnitus.   Respiratory: Denies SOB, DOE, chest tightness,  and wheezing.   Cardiovascular: Denies chest pain, palpitations and leg swelling.  Gastrointestinal: Denies nausea, vomiting, abdominal pain, diarrhea, constipation, blood in stool and abdominal distention.  Genitourinary: Denies dysuria, urgency, frequency, hematuria, flank pain and difficulty urinating.  Endocrine: Denies: hot or cold intolerance, sweats, changes in hair or nails, polyuria, polydipsia. Musculoskeletal: Denies myalgias, back pain, joint swelling, arthralgias and gait problem.  Skin: Denies pallor, rash and wound.  Neurological: Denies dizziness, seizures, syncope, weakness, light-headedness, numbness and headaches.  Hematological: Denies adenopathy. Easy bruising, personal or family bleeding history  Psychiatric/Behavioral: Denies suicidal ideation, mood changes, confusion,  nervousness, sleep disturbance and agitation   Past Medical History:  Diagnosis Date   Allergy    Arthritis    Broken ankle 2008   Coronary artery disease    Diverticulosis    ED (erectile dysfunction)    Facial fracture ([place]) 1979   GERD (gastroesophageal reflux disease)    Hearing loss in right ear    Hemorrhoids    Hiatal hernia    Hyperlipidemia    Hypertension    Peptic stricture of esophagus    Peptic ulcer disease 1989   Prostate cancer ([place]) 02/2009   Seasonal allergies    Type 2 diabetes mellitus ([place])     Past Surgical History:  Procedure Laterality Date   ANKLE SURGERY Left 2008   CHOLECYSTECTOMY N/A 01/31/2020   Procedure: LAPAROSCOPIC CHOLECYSTECTOMY WITH INTRAOPERATIVE CHOLANGIOGRAM;  Surgeon: Clovis Riley, MD;  Location: [facility];  Service: General;  Laterality: N/A;   FACIAL FRACTURE SURGERY     INGUINAL HERNIA REPAIR Left    KNEE ARTHROSCOPY Left 2013   KNEE ARTHROSCOPY  [id number]   [facility]   PROSTATECTOMY  2011   TONSILLECTOMY     UPPER GASTROINTESTINAL ENDOSCOPY     VASECTOMY      Family History  Problem Relation Age of Onset   Breast cancer Mother    Diabetes Mother    Heart attack Mother    Alcohol abuse Father    Stroke Brother    Colon cancer Neg Hx    Esophageal cancer Neg Hx    Rectal cancer Neg Hx    Stomach cancer Neg Hx     SOCIAL HX:   reports that he has never smoked. He has never used smokeless tobacco. He reports current alcohol use of about 12.0 standard drinks per week. He reports that he does not use drugs.   Current Outpatient Medications:  acetaminophen (TYLENOL) 325 MG tablet, Take 650 mg by mouth every 6 (six) hours as needed for moderate pain., Disp: , Rfl:    aspirin EC 81 MG tablet, Take 81 mg by mouth daily. Swallow whole., Disp: , Rfl:    atorvastatin (LIPITOR) 40 MG tablet, TAKE 1 TABLET BY MOUTH EVERY DAY, Disp: 90 tablet, Rfl: 1   azelastine (ASTELIN) 0.1 % nasal spray, Place 1 spray into both nostrils  2 (two) times daily. Use in each nostril as directed, Disp: 30 mL, Rfl: 2   esomeprazole (NEXIUM) 40 MG capsule, TAKE 1 CAPSULE DAILY BEFORE BREAKFAST., Disp: 90 capsule, Rfl: 3   ezetimibe (ZETIA) 10 MG tablet, TAKE ONE TABLET BY MOUTH DAILY, Disp: 90 tablet, Rfl: 1   fexofenadine (ALLEGRA) 180 MG tablet, Take 180 mg by mouth daily., Disp: , Rfl:    Lancets (ONETOUCH ULTRASOFT) lancets, 1 each by Other route daily. Dx E11.9, Disp: 100 each, Rfl: 3   lisinopril (ZESTRIL) 5 MG tablet, Take 1 tablet (5 mg total) by mouth daily., Disp: 90 tablet, Rfl: 1   methocarbamol (ROBAXIN) 500 MG tablet, Take 500 mg by mouth every 6 (six) hours as needed., Disp: , Rfl:    molnupiravir EUA (LAGEVRIO) 200 mg CAPS capsule, Take 4 capsules (800 mg total) by mouth 2 (two) times daily for 5 days., Disp: 40 capsule, Rfl: 0   ONETOUCH ULTRA test strip, CHECK BLOOD SUGAR DAILY, Disp: 100 strip, Rfl: 3   sildenafil (VIAGRA) 100 MG tablet, TAKE 1 TABLET BY MOUTH DAILY AS NEEDED, Disp: 10 tablet, Rfl: 11   SYNJARDY XR 25-1000 MG TB24, Take 1 tablet by mouth daily., Disp: 30 tablet, Rfl: 5   tadalafil (CIALIS) 5 MG tablet, Take 5 mg by mouth daily as needed for erectile dysfunction. , Disp: , Rfl:    Testosterone 30 MG/ACT SOLN, Place onto the skin daily., Disp: , Rfl:   EXAM:   VITALS per patient if applicable: None reported  GENERAL: alert, oriented, appears well and in no acute distress  HEENT: atraumatic, conjunttiva clear, no obvious abnormalities on inspection of external nose and ears  NECK: normal movements of the head and neck  LUNGS: on inspection no signs of respiratory distress, breathing rate appears normal, no obvious gross increased work of breathing, gasping or wheezing  CV: no obvious cyanosis  MS: moves all visible extremities without noticeable abnormality  PSYCH/NEURO: pleasant and cooperative, no obvious depression or anxiety, speech and thought processing grossly intact  ASSESSMENT AND  PLAN:   COVID-19  - Plan: molnupiravir EUA (LAGEVRIO) 200 mg CAPS capsule -He may also use OTC medications such as antihistamines, decongestants, pain relievers, guaifenesin. -We have reviewed quarantine period of 5 days. -We have discussed symptoms that would promote ED evaluation. -He knows to follow with [place] if symptoms fail to resolve.      I discussed the assessment and treatment plan with the patient. The patient was provided an opportunity to ask questions and all were answered. The patient agreed with the plan and demonstrated an understanding of the instructions.   The patient was advised to call back or seek an in-person evaluation if the symptoms worsen or if the condition fails to improve as anticipated.    Lelon Frohlich, MD  [facility]

## 2021-09-13 ENCOUNTER — Telehealth: Payer: Self-pay

## 2021-09-13 ENCOUNTER — Telehealth: Payer: Self-pay | Admitting: Gastroenterology

## 2021-09-13 MED ORDER — ESOMEPRAZOLE MAGNESIUM 40 MG PO CPDR: DELAYED_RELEASE_CAPSULE | ORAL | 0 refills | Status: DC

## 2021-09-13 NOTE — Telephone Encounter (Signed)
Informed patient he is due for follow up visit with Dr. Fuller Plan and I can send a refill to his pharmacy until then. Patient has hernia surgery in March so would like to schedule for April. Appt scheduled for 10/31/21 at 10:30 am. Nexium sent to patients pharmacy. ?

## 2021-09-13 NOTE — Telephone Encounter (Signed)
error 

## 2021-09-13 NOTE — Telephone Encounter (Signed)
Inbound call from patient requesting additional refills for Nexium medication please to be sent to [facility]. ?

## 2021-09-20 ENCOUNTER — Other Ambulatory Visit: Payer: Self-pay | Admitting: Internal Medicine

## 2021-09-20 DIAGNOSIS — E7800 Pure hypercholesterolemia, unspecified: Secondary | ICD-10-CM

## 2021-09-25 ENCOUNTER — Encounter: Payer: Self-pay | Admitting: Internal Medicine

## 2021-09-25 ENCOUNTER — Ambulatory Visit (INDEPENDENT_AMBULATORY_CARE_PROVIDER_SITE_OTHER): Payer: Medicare HMO | Admitting: Internal Medicine

## 2021-09-25 VITALS — BP 140/90 | HR 88 | Temp 98.1°F | Wt 173.3 lb

## 2021-09-25 DIAGNOSIS — E119 Type 2 diabetes mellitus without complications: Secondary | ICD-10-CM | POA: Diagnosis not present

## 2021-09-25 DIAGNOSIS — E7800 Pure hypercholesterolemia, unspecified: Secondary | ICD-10-CM

## 2021-09-25 DIAGNOSIS — R030 Elevated blood-pressure reading, without diagnosis of hypertension: Secondary | ICD-10-CM

## 2021-09-25 DIAGNOSIS — C61 Malignant neoplasm of prostate: Secondary | ICD-10-CM | POA: Diagnosis not present

## 2021-09-25 LAB — POCT GLYCOSYLATED HEMOGLOBIN (HGB A1C): Hemoglobin A1C: 6.2 % — AB (ref 4.0–5.6)

## 2021-09-25 MED ORDER — LISINOPRIL 5 MG PO TABS: 5.0000 mg | ORAL_TABLET | Freq: Every day | ORAL | 1 refills | Status: DC

## 2021-09-25 MED ORDER — ATORVASTATIN CALCIUM 40 MG PO TABS: 40.0000 mg | ORAL_TABLET | Freq: Every day | ORAL | 1 refills | Status: DC

## 2021-09-25 NOTE — Progress Notes (Signed)
? ? ? ?Established Patient Office Visit ? ? ? ? ?This visit occurred during the SARS-CoV-2 public health emergency.  Safety protocols were in place, including screening questions prior to the visit, additional usage of staff PPE, and extensive cleaning of exam room while observing appropriate contact time as indicated for disinfecting solutions.  ? ? ?CC/Reason for Visit: 75-monthfollow-up chronic medical conditions ? ?HPI: Jeff Jenkins a 66y.o. male who is coming in today for the above mentioned reasons. Past Medical History is significant for: Type 2 diabetes, hyperlipidemia, GERD, coronary artery disease, vitamin B12 deficiency.  He is having his hernia repair tomorrow.  He recovered well from his recent COVID-19 infection.  His blood pressure is elevated again in office today.  He tells me home measurements are in the 1 153-220 range systolic.  He feels well and has no acute complaints. ? ? ?Past Medical/Surgical History: ?Past Medical History:  ?Diagnosis Date  ? Allergy   ? Arthritis   ? Broken ankle 2008  ? Coronary artery disease   ? Diverticulosis   ? ED (erectile dysfunction)   ? Facial [place] ([place]  ? GERD (gastroesophageal reflux disease)   ? Hearing loss in right ear   ? Hemorrhoids   ? Hiatal hernia   ? Hyperlipidemia   ? Hypertension   ? Peptic stricture of esophagus   ? Peptic ulcer disease 1989  ? [place]state cancer [place]  ? Seasonal allergies   ? Type 2 diabetes mellitus ([place]   ? ? ?Past Surgical History:  ?Procedure Laterality Date  ? ANKLE SURGERY Left 2008  ? CHOLECYSTECTOMY N/A 01/31/2020  ? Procedure: LAPAROSCOPIC CHOLECYSTECTOMY WITH INTRAOPERATIVE CHOLANGIOGRAM;  Surgeon: CClovis Riley MD;  Location: [facility];  Service: General;  Laterality: N/A;  ? FACIAL FRACTURE SURGERY    ? INGUINAL HERNIA REPAIR Left   ? KNEE ARTHROSCOPY Left 2013  ? KNEE ARTHROSCOPY  [id number] ? [facility]  ? PROSTATECTOMY  2011  ? TONSILLECTOMY    ? UPPER GASTROINTESTINAL ENDOSCOPY    ?  VASECTOMY    ? ? ?Social History: ? reports that he has never smoked. He has never used smokeless tobacco. He reports current alcohol use of about 12.0 standard drinks per week. He reports that he does not use drugs. ? ?Allergies: ?No Known Allergies ? ?Family History:  ?Family History  ?Problem Relation Age of Onset  ? Breast cancer Mother   ? Diabetes Mother   ? Heart attack Mother   ? Alcohol abuse Father   ? Stroke Brother   ? Colon cancer Neg Hx   ? Esophageal cancer Neg Hx   ? Rectal cancer Neg Hx   ? Stomach cancer Neg Hx   ? ? ? ?Current Outpatient Medications:  ?  acetaminophen (TYLENOL) 325 MG tablet, Take 650 mg by mouth every 6 (six) hours as needed for moderate pain., Disp: , Rfl:  ?  aspirin EC 81 MG tablet, Take 81 mg by mouth daily. Swallow whole., Disp: , Rfl:  ?  atorvastatin (LIPITOR) 40 MG tablet, Take 1 tablet (40 mg total) by mouth daily., Disp: 90 tablet, Rfl: 1 ?  azelastine (ASTELIN) 0.1 % nasal spray, Place 1 spray into both nostrils 2 (two) times daily. Use in each nostril as directed, Disp: 30 mL, Rfl: 2 ?  esomeprazole (NEXIUM) 40 MG capsule, TAKE 1 CAPSULE DAILY BEFORE BREAKFAST., Disp: 90 capsule, Rfl: 0 ?  ezetimibe (ZETIA) 10 MG tablet, TAKE ONE  TABLET BY MOUTH DAILY, Disp: 90 tablet, Rfl: 1 ?  fexofenadine (ALLEGRA) 180 MG tablet, Take 180 mg by mouth daily., Disp: , Rfl:  ?  Lancets (ONETOUCH ULTRASOFT) lancets, 1 each by Other route daily. Dx E11.9, Disp: 100 each, Rfl: 3 ?  lisinopril (ZESTRIL) 5 MG tablet, Take 1 tablet (5 mg total) by mouth daily., Disp: 90 tablet, Rfl: 1 ?  methocarbamol (ROBAXIN) 500 MG tablet, Take 500 mg by mouth every 6 (six) hours as needed., Disp: , Rfl:  ?  ONETOUCH ULTRA test strip, CHECK BLOOD SUGAR DAILY, Disp: 100 strip, Rfl: 3 ?  sildenafil (VIAGRA) 100 MG tablet, TAKE 1 TABLET BY MOUTH DAILY AS NEEDED, Disp: 10 tablet, Rfl: 11 ?  SYNJARDY XR 25-1000 MG TB24, Take 1 tablet by mouth daily., Disp: 30 tablet, Rfl: 5 ?  tadalafil (CIALIS) 5 MG tablet,  Take 5 mg by mouth daily as needed for erectile dysfunction. , Disp: , Rfl:  ?  Testosterone 30 MG/ACT SOLN, Place onto the skin daily., Disp: , Rfl:  ? ?Review of Systems:  ?Constitutional: Denies fever, chills, diaphoresis, appetite change and fatigue.  ?HEENT: Denies photophobia, eye pain, redness, hearing loss, ear pain, congestion, sore throat, rhinorrhea, sneezing, mouth sores, trouble swallowing, neck pain, neck stiffness and tinnitus.   ?Respiratory: Denies SOB, DOE, cough, chest tightness,  and wheezing.   ?Cardiovascular: Denies chest pain, palpitations and leg swelling.  ?Gastrointestinal: Denies nausea, vomiting, abdominal pain, diarrhea, constipation, blood in stool and abdominal distention.  ?Genitourinary: Denies dysuria, urgency, frequency, hematuria, flank pain and difficulty urinating.  ?Endocrine: Denies: hot or cold intolerance, sweats, changes in hair or nails, polyuria, polydipsia. ?Musculoskeletal: Denies myalgias, back pain, joint swelling, arthralgias and gait problem.  ?Skin: Denies pallor, rash and wound.  ?Neurological: Denies dizziness, seizures, syncope, weakness, light-headedness, numbness and headaches.  ?Hematological: Denies adenopathy. Easy bruising, personal or family bleeding history  ?Psychiatric/Behavioral: Denies suicidal ideation, mood changes, confusion, nervousness, sleep disturbance and agitation ? ? ? ?Physical Exam: ?Vitals:  ? 09/25/21 1050  ?BP: 140/90  ?Pulse: 88  ?Temp: 98.1 ?F (36.7 ?C)  ?TempSrc: Oral  ?SpO2: 98%  ?Weight: 173 lb 4.8 oz (78.6 kg)  ? ? ?Body mass index is 23.5 kg/m?. ? ? ?Constitutional: NAD, calm, comfortable ?Eyes: PERRL, lids and conjunctivae normal ?ENMT: Mucous membranes are moist.  ?Respiratory: clear to auscultation bilaterally, no wheezing, no crackles. Normal respiratory effort. No accessory muscle use.  ?Cardiovascular: Regular rate and rhythm, no murmurs / rubs / gallops. No extremity edema. ?Neurologic: Grossly intact and  nonfocal ?Psychiatric: Normal judgment and insight. Alert and oriented x 3. Normal mood.  ? ? ?Impression and Plan: ? ?Type 2 diabetes mellitus without obesity ([place])  ?- Plan: POCT glycosylated hemoglobin (Hb A1C) ?-A1c demonstrates excellent control at 6.2 today. ? ?Elevated blood pressure reading in office with white coat syndrome, without diagnosis of hypertension  ?-Again noted to have elevated blood pressure readings in office. ?-He will do ambulatory blood pressure monitoring and return in 3 months for follow-up, he is noted to have an element of whitecoat syndrome. ?-He is on 5 mg of lisinopril not for blood pressure but for microalbuminuria due to his diabetes. ? ?Pure hypercholesterolemia  ?- Plan: atorvastatin (LIPITOR) 40 MG tablet ?-Last lipid panel with an LDL of 53 in 2021, due for recheck next visit. ? ?Prostate cancer ([place]) ?-Follows with urology. ? ?Time spent: 31 minutes reviewing chart, interviewing and examining patient and formulating plan of care. ? ? ? ? ? ?  Lelon Frohlich, MD ?[facility] ? ? ?

## 2021-09-26 DIAGNOSIS — K4190 Unilateral femoral hernia, without obstruction or gangrene, not specified as recurrent: Secondary | ICD-10-CM | POA: Diagnosis not present

## 2021-09-26 DIAGNOSIS — K429 Umbilical hernia without obstruction or gangrene: Secondary | ICD-10-CM | POA: Diagnosis not present

## 2021-09-26 DIAGNOSIS — K4091 Unilateral inguinal hernia, without obstruction or gangrene, recurrent: Secondary | ICD-10-CM | POA: Diagnosis not present

## 2021-09-26 DIAGNOSIS — K4090 Unilateral inguinal hernia, without obstruction or gangrene, not specified as recurrent: Secondary | ICD-10-CM | POA: Diagnosis not present

## 2021-09-26 HISTORY — PX: HERNIA REPAIR: SHX51

## 2021-09-27 ENCOUNTER — Ambulatory Visit: Payer: Medicare HMO | Admitting: Internal Medicine

## 2021-10-04 ENCOUNTER — Encounter: Admit: 2021-10-04 | Discharge: 2021-10-04 | Payer: MEDICARE

## 2021-10-09 ENCOUNTER — Encounter: Admit: 2021-10-09 | Discharge: 2021-10-09 | Payer: MEDICARE

## 2021-10-09 NOTE — Progress Notes
Medicare Primary No pre-certification is required.

## 2021-10-16 ENCOUNTER — Other Ambulatory Visit: Payer: Self-pay | Admitting: Internal Medicine

## 2021-10-16 DIAGNOSIS — E119 Type 2 diabetes mellitus without complications: Secondary | ICD-10-CM

## 2021-10-24 ENCOUNTER — Encounter: Admit: 2021-10-24 | Discharge: 2021-10-24 | Payer: MEDICARE

## 2021-10-24 NOTE — Patient Instructions
Coronary CT Angiography Instructions    Tommy GainesMichael David Miller  [id number]  06/02/1956  10/24/2021    ARRIVAL TIME    [facility]: 10/30/21  Please arrive at the following time: 100 CT 200 office visit 300 PAC          DO NOT EAT FOR 4 HOURS PRIOR TO YOUR PROCEDURE.  YOU SHOULD DRINK PLENTY OF CLEAR LIQUIDS UP TO ARRIVAL AT OFFICE.      It is important to take these medications exactly as written.  These medications prepare you for the procedure.    Do Not Take Metformin The Day Of The Procedure And Do Not Take For 24 Hours After Procedure: Verified      Do not take any non-steroidal inflammatory medications, (ibuprofen, Aleve, Advil, etc.) for 48 hours beginning the day of the procedure.    Hold All Diuretics For 24 Hours Beginning The Day Of The Procedure: Verified    You May Take All Other Medications With Water The Morning Of The Procedure: Verified         If you have any questions, please call the [facility] office at [phone]

## 2021-10-30 ENCOUNTER — Encounter: Admit: 2021-10-30 | Discharge: 2021-10-30 | Payer: MEDICARE

## 2021-10-30 ENCOUNTER — Ambulatory Visit: Admit: 2021-10-30 | Discharge: 2021-10-30 | Payer: MEDICARE

## 2021-10-30 DIAGNOSIS — G4733 Obstructive sleep apnea (adult) (pediatric): Secondary | ICD-10-CM

## 2021-10-30 DIAGNOSIS — R002 Palpitations: Secondary | ICD-10-CM

## 2021-10-30 DIAGNOSIS — I480 Paroxysmal atrial fibrillation: Secondary | ICD-10-CM

## 2021-10-30 DIAGNOSIS — R112 Nausea with vomiting, unspecified: Secondary | ICD-10-CM

## 2021-10-30 DIAGNOSIS — I10 Essential (primary) hypertension: Secondary | ICD-10-CM

## 2021-10-30 DIAGNOSIS — S4991XA Unspecified injury of right shoulder and upper arm, initial encounter: Secondary | ICD-10-CM

## 2021-10-30 DIAGNOSIS — I4510 Unspecified right bundle-branch block: Secondary | ICD-10-CM

## 2021-10-30 DIAGNOSIS — I4891 Unspecified atrial fibrillation: Secondary | ICD-10-CM

## 2021-10-30 DIAGNOSIS — K219 Gastro-esophageal reflux disease without esophagitis: Secondary | ICD-10-CM

## 2021-10-30 DIAGNOSIS — Z136 Encounter for screening for cardiovascular disorders: Secondary | ICD-10-CM

## 2021-10-30 LAB — CBC
HEMATOCRIT: 43 % (ref 40–50)
HEMOGLOBIN: 15 g/dL (ref 13.5–16.5)
MCH: 34 pg — ABNORMAL HIGH (ref 26–34)
MCHC: 35 g/dL (ref 32.0–36.0)
MCV: 99 FL (ref 80–100)
MPV: 8.7 FL (ref 7–11)
PLATELET COUNT: 205 K/UL (ref 150–400)
RBC COUNT: 4.3 M/UL — ABNORMAL LOW (ref 4.4–5.5)
RDW: 13 % (ref 11–15)
WBC COUNT: 7.1 K/UL (ref 4.5–11.0)

## 2021-10-30 LAB — POC CREATININE, RAD: CREATININE, POC: 1.1 mg/dL (ref 0.4–1.24)

## 2021-10-30 MED ORDER — IOHEXOL 350 MG IODINE/ML IV SOLN
100 mL | Freq: Once | INTRAVENOUS | 0 refills | Status: CP
Start: 2021-10-30 — End: ?
  Administered 2021-10-30: 19:00:00 100 mL via INTRAVENOUS

## 2021-10-30 MED ORDER — SODIUM CHLORIDE 0.9 % IJ SOLN
50 mL | Freq: Once | INTRAVENOUS | 0 refills | Status: CP
Start: 2021-10-30 — End: ?
  Administered 2021-10-30: 19:00:00 50 mL via INTRAVENOUS

## 2021-10-30 NOTE — Progress Notes
Pt had IV in right anterior arm.  Labs drawn from this IV and catheter dc'd.  Catheter intact and hemostasis achieved.

## 2021-10-30 NOTE — Progress Notes
Date of Service: 10/30/2021    Tommy Miller is a 66 y.o. male.       HPI  Tommy Miller was seen in the office today in electrophysiology follow up. He typically follows with Dr. Derrell Lolling. He has a past medical history of hypertension, obstructive sleep apnea, obesity and paroxysmal atrial fibrillation. He is being seen today for an updated history and physical prior to upcoming AF ablation and Watchman procedure on 11/12/2021.      Regarding his atrial fibrillation, he was diagnosed in 2022 during a very stressful period of time when he was caring for his ill father. He was having episodes up to 14 hours in duration with an overall burden of 4%. He was placed on Xarelto at that time. He was actually scheduled to undergo pulmonary vein isolation but he developed hemorrhoidal bleeding and ablation was canceled.  He is back on his usual dose of Eliquis. He saw Elvina Sidle in follow-up in December 2022 and plans were arranged for ablation and appendage occlusion to be done the same day. His CHADS2-VASc score is 3 (hypertension (1), age > 70 (1), DM (1)). In the interim he was started on Multaq 400 mg twice daily in addition to his Toprol-XL 50 mg twice daily.     He presents today for updated H&P. He states he overall feels well. He is symptomatic from regarding his atrial fibrillation with reported palpitations and heart fluttering. He reports last known episode of AF to his knowledge as 4-5 days ago and lasted about 4-5 hours and reports his heart rates were well controlled. He remains on Eliquis and denies any bleeding issues recently. He denies any missed doses. He denies any acute cardiovascular complaints today. He denies any fever, chills, chest pain, SOB, lightheadedness, syncope or edema. He denies any recent hospitalizations, acute illness, or changes in medical history.       Left atrial appendage occlusion pre and post documentation (45 days, 6 months, 1 year and 2 years):  - Pre-procedure only  - Documentation of shared decision making (2 providers): Dr. Lorain Childes and Dr. Greig Castilla   - CBC/CMP lab draw ordered: Yes  - An evidence-based tool (EBT) was provided to patient to ensure that the patients? health goals and preferences were covered.  - A copy of the EBT can be accessed thru [facility] system under 'Left Atrial Appendage Occlusion (LAAO) Device Shared Decision-Making Tool'     - Modified Rankin Scale: 1  - Barthel Index Score: 100  - NYHA class: 0  - HAS-BLED score: HTN (1), Bleeding History (1) and Elderly >65 (1)  -     HAS-BLED Yearly Risk: 3=3.74%    -     CHA2DS2-VASc Score: HTN (1), DM (1) and 65-74 (1)  -     CHA2DSVASc Yearly Stroke Risk: 3=3.2%         Vitals:    10/30/21 1401   BP: (!) 144/70   BP Source: Arm, Left Upper   Pulse: 69   SpO2: 98%   O2 Device: None (Room air)   PainSc: Zero   Weight: 109.8 kg (242 lb)   Height: 180.3 cm (5' 11)     Body mass index is 33.75 kg/m?Marland Kitchen     Past Medical History  Patient Active Problem List    Diagnosis Date Noted   ? Type 2 diabetes mellitus without complication, without long-term current use of insulin (HCC) 08/30/2021   ? Type  2 diabetes mellitus with hyperglycemia (HCC) 08/30/2021   ? Non-alcoholic fatty liver disease 08/29/2021   ? Subclinical hypothyroidism 08/29/2021     The patient had an elevated TSH of 5.5, but has normal T4 of 0.7.     ? Polycythemia 08/29/2021   ? BRBPR (bright red blood per rectum) 08/20/2021   ? Obesity (BMI 30-39.9) 06/15/2021   ? RBBB 06/15/2021   ? Erectile dysfunction due to arterial insufficiency 05/01/2021   ? Health maintenance examination 05/01/2021     Male Wellness Exam   The patient is a lifetime non-smoker.   Alcohol abuse screening/behavioral counseling: Patient does not drink alcohol.  Colorectal cancer screening (age 87-75 - high sensitivity FOBT q71yr OR sigmoidoscopy q35yr + FOBT q45yr OR colonoscopy q33yr): Patient had normal screening colonoscopy September 2022.  Depression screening (q50yr): PHQ 2-0  T2DM screening (asymptomatic adult with sustained BP >135/80 - fasting BG, 2-hour postprandial OR hemoglobin A1C): The patient had normal hemoglobin A1c April 2022, he has had elevated fasting blood glucose on CMP's.  Falls screening (Rx exercise, PT and/or vitamin D to community-dwelling over age 38 at increased risk): _  Lipid disorder screening (q37yr, age 25-35 if high risk, all men over age 46): Patient had remarkably elevated triglycerides along with increased LDL and decreased HDL April 2022  Skin cancer counseling (age 107-24 w/ fair skin): _ - Provide counseling about minimizing exposure to ultraviolet radiation to reduce risk for skin cancer.  Flu vaccine (q22yr): Declined  Zoster vaccine (Shingrix for >50 yo even if previously received Zostavax, 2 shot regimen: 1st, 2nd one 2-6 months later): _  Pneumococcal vaccine (PCV20): _     ? A-fib (HCC)    ? Hemorrhoids, internal, with bleeding    ? HTN (hypertension) 12/26/2020   ? OSA (obstructive sleep apnea) 12/26/2020     Per OV note on 12/15/2020 with Ronnette Juniper, PA     ? Mixed hyperlipidemia 12/15/2020   ? Acne rosacea 12/15/2020         Review of Systems   Constitutional: Negative.   HENT: Negative.    Eyes: Negative.    Cardiovascular: Negative.    Respiratory: Negative.    Endocrine: Negative.    Hematologic/Lymphatic: Negative.    Skin: Negative.    Musculoskeletal: Negative.    Gastrointestinal: Negative.    Genitourinary: Negative.    Neurological: Negative.    Psychiatric/Behavioral: Negative.    Allergic/Immunologic: Negative.        Physical Exam   Constitutional: He appears well-developed and well-nourished.   HENT:   Head: Normocephalic and atraumatic.   Eyes: EOM are normal.   Cardiovascular: Normal rate and regular rhythm.   Pulmonary/Chest: Effort normal and breath sounds normal.   Musculoskeletal:         General: No edema. Normal range of motion.      Cervical back: Normal range of motion. Neurological: He is alert.   Skin: Skin is warm and dry.   Psychiatric: He has a normal mood and affect. His behavior is normal.       Cardiovascular Studies  Premilinary ECG obtained in clinic today shows sinus rhythm at 69 bpm with right bundle branch pattern with QRS duration of 146 ms.     Cardiovascular Health Factors  Vitals BP Readings from Last 3 Encounters:   10/30/21 137/76   10/30/21 (!) 144/70   08/29/21 (!) 142/80     Wt Readings from Last 3 Encounters:   10/30/21 109.5 kg (241 lb  6.4 oz)   10/30/21 109.8 kg (242 lb)   08/29/21 112 kg (247 lb)     BMI Readings from Last 3 Encounters:   10/30/21 33.67 kg/m?   10/30/21 33.75 kg/m?   08/29/21 34.45 kg/m?      Smoking Social History     Tobacco Use   Smoking Status Never   Smokeless Tobacco Never      Lipid Profile Cholesterol   Date Value Ref Range Status   08/17/2021 144 <200 MG/DL Final     HDL   Date Value Ref Range Status   08/17/2021 30 (L) >40 MG/DL Final     LDL   Date Value Ref Range Status   08/17/2021 53 <100 mg/dL Final     Triglycerides   Date Value Ref Range Status   08/17/2021 514 (H) <150 MG/DL Final      Blood Sugar Hemoglobin A1C   Date Value Ref Range Status   08/29/2021 9.4 (H) 4.0 - 5.7 % Final     Glucose   Date Value Ref Range Status   08/17/2021 279 (H) 70 - 100 MG/DL Final   16/04/9603 540 (H) 70 - 100 MG/DL Final   98/05/9146 829 (H)  Final          Problems Addressed Today  Encounter Diagnoses   Name Primary?   ? Screening for heart disease Yes   ? Paroxysmal atrial fibrillation (HCC)    ? RBBB    ? Primary hypertension        Assessment and Plan  Tommy Miller is ready to proceed with Watchman procedure and AF ablation scheduled with Dr. Micheline Rough and Dr. Derrell Lolling on 11/12/21. Remains on Eliquis and denies any bleeding or missed doses. Procedural details and hospital course were discussed with patient today. Pre-procedure labs to be done prior to procedure. All questions were answered to patient satisfaction. Pre-procedure instructions provided to patient. Further recommendations pending hospital course.    It was a pleasure seeing Tommy Miller in follow-up today.  Thank you for allowing me to participate in the care of this patient.      Craig Guess, PA-C  Heart Rhythm Management   Department of Cardiovasular Medicine    Current Medications (including today's revisions)  ? amLODIPine (NORVASC) 10 mg tablet Take one tablet by mouth daily. (Patient taking differently: Take one tablet by mouth daily as needed (elevated BP).)   ? apixaban (ELIQUIS) 5 mg tablet Take one tablet by mouth twice daily.   ? ascorbic acid (vitamin C) 500 mg tablet Take one tablet by mouth daily.   ? cetirizine (ZYRTEC) 10 mg tablet Take one tablet by mouth at bedtime daily.   ? CHOLEcalciferoL (vitamin D3) (VITAMIN D3) 1,000 units tablet Take 1 capsule in the morning and 2 capsules at bedtime   ? dronedarone (MULTAQ) 400 mg tablet Take one tablet by mouth twice daily with meals.   ? dulaglutide (TRULICITY) 1.5 mg/0.5 mL injection pen Inject 0.5 mL under the skin every 7 days.   ? fish oil /omega-3 fatty acids (SEA-OMEGA) 340/1000 mg capsule Take two capsules by mouth twice daily.   ? fluticasone propionate (FLONASE) 50 mcg/actuation nasal spray, suspension Apply two sprays to each nostril as directed daily. Shake bottle gently before using. (Patient taking differently: Apply two sprays to each nostril as directed at bedtime daily. Shake bottle gently before using.)   ? losartan (COZAAR) 25 mg tablet Take one tablet by mouth daily. (Patient taking differently: Take  one tablet by mouth at bedtime daily.)   ? magnesium oxide (MAGOX) 400 mg (241.3 mg magnesium) tablet Take one tablet by mouth daily.   ? metFORMIN (GLUCOPHAGE) 500 mg tablet Take one tablet by mouth twice daily after meals. Indications: type 2 diabetes mellitus   ? metoprolol succinate XL (TOPROL XL) 100 mg extended release tablet Take 1 tab in am, 1 tab in pm (Patient taking differently: one tablet twice daily.)   ? metroNIDAZOLE (METROGEL) 1 % topical gel Apply one g topically to affected area twice daily.   ? minocycline (MINOCIN) 100 mg capsule Take one capsule by mouth daily. (Patient taking differently: Take one capsule by mouth at bedtime daily.)   ? montelukast (SINGULAIR) 10 mg tablet Take one tablet by mouth at bedtime daily.   ? omeprazole DR (PRILOSEC) 20 mg capsule TAKE 1 CAPSULE BY MOUTH DAILY BEFORE BREAKFAST   ? pantoprazole DR (PROTONIX) 40 mg tablet Take one tablet by mouth twice daily for 45 days. Take one tablet by mouth twice daily for 7 days prior to procedure (11/12/21) then take one tablet by mouth twice daily for 30 days after procedure.   ? phenylephrine-cocoa butter (PREPARATION H(PE,CB)) 0.25-88.44 % rectal suppository Insert or Apply one suppository to rectal area as directed every 12 hours. Indications: hemorrhoids   ? rosuvastatin (CRESTOR) 20 mg tablet Take one tablet by mouth daily. Indications: high cholesterol and high triglycerides   ? vitamin E 400 unit capsule Take one capsule by mouth daily.

## 2021-10-30 NOTE — Anesthesia Pre-Procedure Evaluation
Anesthesia Pre-Procedure Evaluation    Name: Tommy Miller      MRN: [id number]     DOB: 01-08-1956     Age: 66 y.o.     Sex: male   _________________________________________________________________________     Procedure Info:   Procedure Information     Date/Time: 11/12/21 1700    Procedures:       PERCUTANEOUS CLOSURE LEFT ATRIAL APPENDAGE WITH ENDOCARDIAL IMPLANT, TRANSEPTAL CATHETERIZATION, FOLEY, CONTRAST ANTICIPATED - WATCHMAN PROTOCOL, [employer] combo case with RRR doing ablation      TRANSESOPHAGEAL ECHOCARDIOGRAM DURING INTERVENTION - INTRA-OP TEE - 3-D TEE ECHO WITH PROBE    Location: [facility] 8 / [facility] EP LAB    Providers: Reola Mosher, MD; Cath, Physician          Physical Assessment  Vital Signs (last filed in past 24 hours):  BP: 137/76 (04/18 1522)  Temp: 36.2 ?C (97.2 ?F) (04/18 1522)  Pulse: 69 (04/18 1522)  Respirations: 14 PER MINUTE (04/18 1522)  SpO2: 97 % (04/18 1522)  O2 Device: None (Room air) (04/18 1522)  Height: 180.3 cm (5' 11) (04/18 1522)  Weight: 109.5 kg (241 lb 6.4 oz) (04/18 1522)  Admission / Dosing Weight: 109.5 kg (241 lb 6.4 oz) (04/18 1522)      Patient History   No Known Allergies     Current Medications    Medication Directions   amLODIPine (NORVASC) 10 mg tablet Take one tablet by mouth daily.  Patient taking differently: Take one tablet by mouth daily as needed (elevated BP).   apixaban (ELIQUIS) 5 mg tablet Take one tablet by mouth twice daily.   ascorbic acid (vitamin C) 500 mg tablet Take one tablet by mouth daily.   cetirizine (ZYRTEC) 10 mg tablet Take one tablet by mouth at bedtime daily.   CHOLEcalciferoL (vitamin D3) (VITAMIN D3) 1,000 units tablet Take 1 capsule in the morning and 2 capsules at bedtime   dronedarone (MULTAQ) 400 mg tablet Take one tablet by mouth twice daily with meals.   dulaglutide (TRULICITY) 1.5 mg/0.5 mL injection pen Inject 0.5 mL under the skin every 7 days.   fish oil /omega-3 fatty acids (SEA-OMEGA) 340/1000 mg capsule Take two capsules by mouth twice daily.   fluticasone propionate (FLONASE) 50 mcg/actuation nasal spray, suspension Apply two sprays to each nostril as directed daily. Shake bottle gently before using.  Patient taking differently: Apply two sprays to each nostril as directed at bedtime daily. Shake bottle gently before using.   losartan (COZAAR) 25 mg tablet Take one tablet by mouth daily.  Patient taking differently: Take one tablet by mouth at bedtime daily.   magnesium oxide (MAGOX) 400 mg (241.3 mg magnesium) tablet Take one tablet by mouth daily.   metFORMIN (GLUCOPHAGE) 500 mg tablet Take one tablet by mouth twice daily after meals. Indications: type 2 diabetes mellitus   metoprolol succinate XL (TOPROL XL) 100 mg extended release tablet Take 1 tab in am, 1 tab in pm  Patient taking differently: one tablet twice daily.   metroNIDAZOLE (METROGEL) 1 % topical gel Apply one g topically to affected area twice daily.   minocycline (MINOCIN) 100 mg capsule Take one capsule by mouth daily.  Patient taking differently: Take one capsule by mouth at bedtime daily.   montelukast (SINGULAIR) 10 mg tablet Take one tablet by mouth at bedtime daily.   omeprazole DR (PRILOSEC) 20 mg capsule TAKE 1 CAPSULE BY MOUTH DAILY BEFORE BREAKFAST   pantoprazole DR (  PROTONIX) 40 mg tablet Take one tablet by mouth twice daily for 45 days. Take one tablet by mouth twice daily for 7 days prior to procedure (11/12/21) then take one tablet by mouth twice daily for 30 days after procedure.   phenylephrine-cocoa butter (PREPARATION H(PE,CB)) 0.25-88.44 % rectal suppository Insert or Apply one suppository to rectal area as directed every 12 hours. Indications: hemorrhoids   rosuvastatin (CRESTOR) 20 mg tablet Take one tablet by mouth daily. Indications: high cholesterol and high triglycerides   vitamin E 400 unit capsule Take one capsule by mouth daily.         Review of Systems/Medical History        PONV Screening: Non-smoker and Hx PONV/motion sickness  No history of anesthetic complications  No family history of anesthetic complications      Airway - negative        Pulmonary      Not a current smoker        No indications/hx of asthma        Obstructive Sleep Apnea          Interventions: CPAP; compliant      Cardiovascular         Exercise tolerance: >4 METS      Hypertension,         Dysrhythmias (RBBB); atrial fibrillation      Hyperlipidemia      GI/Hepatic/Renal         GERD,       Liver disease (NAFLD):        No renal disease:        Neuro/Psych       No seizures      No CVA        Endocrine/Other       Diabetes, poorly controlled, type 2; using insulin      Most recent Hgb A1C:> 9      Obesity      Constitution - negative   Physical Exam    Airway Findings      Mallampati: II      TM distance: >3 FB      Neck ROM: full      Mouth opening: good      Airway patency: adequate    Dental Findings: Negative      Cardiovascular Findings:       Rhythm: irregular      Rate: normal    Pulmonary Findings:       Breath sounds clear to auscultation.    Abdominal Findings:       Obese    Neurological Findings:       Alert and oriented x 3    Constitutional findings:       No acute distress       Diagnostic Tests  Hematology:   Lab Results   Component Value Date    HGB 17.2 08/17/2021    HCT 49.8 08/17/2021    PLTCT 168 08/17/2021    WBC 6.7 08/17/2021    NEUT 56 05/29/2021    ANC 1.72 05/29/2021    ALC 0.88 05/29/2021    MONA 14 05/29/2021    AMC 0.44 05/29/2021    EOSA 1 05/29/2021    ABC 0.01 05/29/2021    MCV 90.9 08/17/2021    MCH 31.4 08/17/2021    MCHC 34.5 08/17/2021    MPV 10.2 08/17/2021    RDW 15.9 08/17/2021         General Chemistry:  Lab Results   Component Value Date    NA 136 08/17/2021    K 3.9 08/17/2021    CL 99 08/17/2021    CO2 25 08/17/2021    GAP 12 08/17/2021    BUN 19 08/17/2021    CR 1.1 10/30/2021    CR 1.01 08/17/2021    GLU 279 08/17/2021    CA 9.9 08/17/2021    ALBUMIN 4.5 08/17/2021    MG 1.9 08/17/2021    TOTBILI 1.2 08/17/2021 Coagulation: No results found for: PT, PTT, INR    Treadmill stress test 3/32/21  1. No evidence of stress induced ischemia   2. Low risk test     EKG 10/30/21  IMPRESSION  Normal sinus rhythm  Right bundle branch block  Septal infarct (cited on or before 17-Aug-2021)  Abnormal ECG  When compared with ECG of 17-Aug-2021 09:40,  Sinus rhythm has replaced Atrial fibrillation  Questionable change in initial forces of Anterior leads  Non-specific change in ST segment in Inferior leads  Nonspecific T wave abnormality now evident in Inferior leads  T wave inversion no longer evident in Anterolateral leads    PFT's 04/28/19  FVC-Pre 3.89    L Final   FVC-%Pred-pre 86    % Final   FVC-Post 3.74    L Final   FVC-%Pred-Post 83    % Final   FEV1-Pre 2.90    L Final   FEV1-%Pred-Pre 84    % Final   FEV1-Post 2.98    L Final   FEV1-%Pred-Post 86    % Final   FEV1/FVC-Pre 75    % Final   FEV1FVC-LLN 65    % Final     PAC plan  Labs: CBC, CMP on DOS  ROI: none  Studies: none  Consults: none     Anesthesia Plan    ASA score: 3

## 2021-10-31 ENCOUNTER — Ambulatory Visit: Payer: Medicare HMO | Admitting: Gastroenterology

## 2021-10-31 ENCOUNTER — Encounter: Payer: Self-pay | Admitting: Gastroenterology

## 2021-10-31 ENCOUNTER — Encounter: Admit: 2021-10-31 | Discharge: 2021-10-31 | Payer: MEDICARE

## 2021-10-31 VITALS — BP 116/70 | HR 80 | Ht 72.0 in | Wt 174.1 lb

## 2021-10-31 DIAGNOSIS — I4891 Unspecified atrial fibrillation: Secondary | ICD-10-CM

## 2021-10-31 DIAGNOSIS — R198 Other specified symptoms and signs involving the digestive system and abdomen: Secondary | ICD-10-CM

## 2021-10-31 DIAGNOSIS — K219 Gastro-esophageal reflux disease without esophagitis: Secondary | ICD-10-CM

## 2021-10-31 DIAGNOSIS — R634 Abnormal weight loss: Secondary | ICD-10-CM

## 2021-10-31 MED ORDER — ESOMEPRAZOLE MAGNESIUM 40 MG PO CPDR: DELAYED_RELEASE_CAPSULE | ORAL | 3 refills | Status: DC

## 2021-10-31 MED ORDER — DICYCLOMINE HCL 10 MG PO CAPS: 10.0000 mg | ORAL_CAPSULE | Freq: Three times a day (TID) | ORAL | 3 refills | Status: DC | PRN

## 2021-10-31 NOTE — Progress Notes (Signed)
? ? ?  History of Present Illness: This is a 66 year old male returning for follow-up of GERD.  He states he has occasional breakthrough symptoms particularly at night when eating late or having spicy foods.  He takes Tums as needed and occasionally Pepcid AC as needed.  His reflux symptoms have been essentially stable.  He notes occasional abdominal gurgling often following meals and occasionally at other times.  His weight has been stable.  He feels weight loss may be related to [place].  He states he discontinued it for a while and resumed metformin and he was able to gain several pounds however side effect of diarrhea related to metformin being discontinued. ? ? ?EGD 08/2020 ?- Benign-appearing, non-obstructing esophageal stenosis. ?- Erythematous mucosa in the gastric body and antrum. Biopsied. ?- Small hiatal hernia. ?- Normal duodenal bulb and second portion of the duodenum. ? ?Current Medications, Allergies, Past Medical History, Past Surgical History, Family History and Social History were reviewed in [employer] record. ? ? ?Physical Exam: ?General: Well developed, well nourished, no acute distress ?Head: Normocephalic and atraumatic ?Eyes: Sclerae anicteric, EOMI ?Ears: Normal auditory acuity ?Mouth: Not examined, mask on during Covid-19 pandemic ?Lungs: Clear throughout to auscultation ?Heart: Regular rate and rhythm; no murmurs, rubs or bruits ?Abdomen: Soft, non tender and non distended. No masses, hepatosplenomegaly or hernias noted. Normal Bowel sounds ?Rectal: Not done ?Musculoskeletal: Symmetrical with no gross deformities  ?Pulses:  Normal pulses noted ?Extremities: No clubbing, cyanosis, edema or deformities noted ?Neurological: Alert oriented x 4, grossly nonfocal ?Psychological:  Alert and cooperative. Normal mood and affect ? ? ?Assessment and Recommendations: ? ?GERD.  Small hiatal hernia.  Schatzki ring.  Mild chronic gastritis, mild reactive gastropathy.  Follow  antireflux measures.  Continue Nexium 40 mg p.o. every morning.  Tums prn or Pepcid AC bid prn for breakthrough symptoms. REV in 1 year. ?Weight loss.  Possibly related to [place].  I asked him to review this with his PCP. ?Borborygmi.  Trial of dicyclomine 10 mg p.o. 3 times daily as needed.  Contact us if symptoms not controlled. REV in 1 year.  ?

## 2021-10-31 NOTE — Patient Instructions (Signed)
We have sent the following medications to your pharmacy for you to pick up at your convenience: Nexium and dicyclomine.  ? ?The [facility] providers would like to encourage you to use [facility] to communicate with providers for non-urgent requests or questions.  Due to long hold times on the telephone, sending your provider a message by [facility] may be a faster and more efficient way to get a response.  Please allow 48 business hours for a response.  Please remember that this is for non-urgent requests.  ? ?Thank you for choosing me and [facility]. ? ?Malcolm T. Dagoberto Ligas., MD., Marval Regal ? ? ?

## 2021-11-02 ENCOUNTER — Other Ambulatory Visit: Payer: Self-pay | Admitting: Internal Medicine

## 2021-11-02 DIAGNOSIS — E7800 Pure hypercholesterolemia, unspecified: Secondary | ICD-10-CM

## 2021-11-05 ENCOUNTER — Encounter: Admit: 2021-11-05 | Discharge: 2021-11-05 | Payer: MEDICARE

## 2021-11-08 ENCOUNTER — Encounter: Admit: 2021-11-08 | Discharge: 2021-11-08 | Payer: MEDICARE

## 2021-11-12 ENCOUNTER — Encounter: Admit: 2021-11-12 | Discharge: 2021-11-12 | Payer: MEDICARE

## 2021-11-12 ENCOUNTER — Inpatient Hospital Stay: Admit: 2021-11-12 | Discharge: 2021-11-12 | Payer: MEDICARE

## 2021-11-12 DIAGNOSIS — R002 Palpitations: Secondary | ICD-10-CM

## 2021-11-12 DIAGNOSIS — K219 Gastro-esophageal reflux disease without esophagitis: Secondary | ICD-10-CM

## 2021-11-12 DIAGNOSIS — I10 Essential (primary) hypertension: Secondary | ICD-10-CM

## 2021-11-12 DIAGNOSIS — G4733 Obstructive sleep apnea (adult) (pediatric): Secondary | ICD-10-CM

## 2021-11-12 DIAGNOSIS — I4891 Unspecified atrial fibrillation: Secondary | ICD-10-CM

## 2021-11-12 DIAGNOSIS — S4991XA Unspecified injury of right shoulder and upper arm, initial encounter: Secondary | ICD-10-CM

## 2021-11-12 DIAGNOSIS — R112 Nausea with vomiting, unspecified: Secondary | ICD-10-CM

## 2021-11-12 MED ORDER — FAMOTIDINE (PF) 20 MG/2 ML IV SOLN
INTRAVENOUS | 0 refills | Status: DC
Start: 2021-11-12 — End: 2021-11-12
  Administered 2021-11-12: 13:00:00 20 mg via INTRAVENOUS

## 2021-11-12 MED ORDER — PROTAMINE 10 MG/ML IV SOLN
INTRAVENOUS | 0 refills | Status: DC
Start: 2021-11-12 — End: 2021-11-12
  Administered 2021-11-12: 19:00:00 20 mg via INTRAVENOUS
  Administered 2021-11-12: 19:00:00 30 mg via INTRAVENOUS

## 2021-11-12 MED ORDER — PHENYLEPHRINE 80MCG/ML IN NS IV DRIP (DBL CONC)
INTRAVENOUS | 0 refills | Status: DC
Start: 2021-11-12 — End: 2021-11-12
  Administered 2021-11-12 (×2): .4 ug/kg/min via INTRAVENOUS
  Administered 2021-11-12 (×2): .3 ug/kg/min via INTRAVENOUS

## 2021-11-12 MED ORDER — CEFAZOLIN 1 GRAM IJ SOLR
INTRAVENOUS | 0 refills | Status: DC
Start: 2021-11-12 — End: 2021-11-12
  Administered 2021-11-12: 18:00:00 2 g via INTRAVENOUS

## 2021-11-12 MED ORDER — VASOPRESSIN 20 UNITS/20ML SYR (1 UNIT/ML) (AN) (OSM)
INTRAVENOUS | 0 refills | Status: DC
Start: 2021-11-12 — End: 2021-11-12
  Administered 2021-11-12 (×2): 1 [IU] via INTRAVENOUS

## 2021-11-12 MED ORDER — LIDOCAINE (PF) 200 MG/10 ML (2 %) IJ SYRG
INTRAVENOUS | 0 refills | Status: DC
Start: 2021-11-12 — End: 2021-11-12
  Administered 2021-11-12: 13:00:00 100 mg via INTRAVENOUS

## 2021-11-12 MED ORDER — FUROSEMIDE 10 MG/ML IJ SOLN
INTRAVENOUS | 0 refills | Status: DC
Start: 2021-11-12 — End: 2021-11-12
  Administered 2021-11-12: 17:00:00 40 mg via INTRAVENOUS

## 2021-11-12 MED ORDER — PROPOFOL INJ 10 MG/ML IV VIAL
INTRAVENOUS | 0 refills | Status: DC
Start: 2021-11-12 — End: 2021-11-12
  Administered 2021-11-12: 13:00:00 150 mg via INTRAVENOUS
  Administered 2021-11-12: 17:00:00 30 mg via INTRAVENOUS
  Administered 2021-11-12 (×2): 50 mg via INTRAVENOUS
  Administered 2021-11-12: 18:00:00 30 mg via INTRAVENOUS

## 2021-11-12 MED ORDER — DEXAMETHASONE SODIUM PHOSPHATE 4 MG/ML IJ SOLN
INTRAVENOUS | 0 refills | Status: DC
Start: 2021-11-12 — End: 2021-11-12
  Administered 2021-11-12: 13:00:00 4 mg via INTRAVENOUS

## 2021-11-12 MED ORDER — ARTIFICIAL TEARS (PF) SINGLE DOSE DROPS GROUP
OPHTHALMIC | 0 refills | Status: DC
Start: 2021-11-12 — End: 2021-11-12
  Administered 2021-11-12: 13:00:00 2 [drp] via OPHTHALMIC

## 2021-11-12 MED ORDER — SUCCINYLCHOLINE CHLORIDE 20 MG/ML IJ SOLN
INTRAVENOUS | 0 refills | Status: DC
Start: 2021-11-12 — End: 2021-11-12
  Administered 2021-11-12: 13:00:00 160 mg via INTRAVENOUS

## 2021-11-12 MED ORDER — FENTANYL CITRATE (PF) 50 MCG/ML IJ SOLN
INTRAVENOUS | 0 refills | Status: DC
Start: 2021-11-12 — End: 2021-11-12
  Administered 2021-11-12: 13:00:00 100 ug via INTRAVENOUS

## 2021-11-12 MED ORDER — ONDANSETRON HCL (PF) 4 MG/2 ML IJ SOLN
INTRAVENOUS | 0 refills | Status: DC
Start: 2021-11-12 — End: 2021-11-12
  Administered 2021-11-12: 19:00:00 4 mg via INTRAVENOUS

## 2021-11-12 MED ADMIN — SODIUM CHLORIDE 0.9 % IV SOLP [27838]: INTRAVENOUS | @ 12:00:00 | Stop: 2021-11-12 | NDC 00338004904

## 2021-11-12 MED ADMIN — SUCRALFATE 1 GRAM PO TAB [11442]: 1 g | ORAL | @ 21:00:00 | NDC 00093221005

## 2021-11-12 MED ADMIN — DEXTROSE 5% IN WATER IV SOLP [2364]: 20 ug/min | INTRAVENOUS | @ 16:00:00 | Stop: 2021-11-12 | NDC 00338001703

## 2021-11-12 MED ADMIN — DRONEDARONE 400 MG PO TAB [173842]: 400 mg | ORAL | @ 23:00:00 | NDC 00024414260

## 2021-11-12 MED ADMIN — METOPROLOL SUCCINATE 50 MG PO TB24 [77931]: 100 mg | ORAL | @ 21:00:00 | NDC 00904632361

## 2021-11-12 MED ADMIN — SODIUM CHLORIDE 0.9 % IV SOLP [27838]: 500.000 mL | @ 14:00:00 | Stop: 2021-11-12 | NDC 00338004903

## 2021-11-12 MED ADMIN — ROSUVASTATIN 20 MG PO TAB [88504]: 20 mg | ORAL | @ 21:00:00 | NDC 68462026390

## 2021-11-12 MED ADMIN — HEPARIN (PORCINE) 1,000 UNIT/ML IJ SOLN [10176]: 500.000 mL | @ 14:00:00 | Stop: 2021-11-12 | NDC 63323054011

## 2021-11-12 MED ADMIN — HEPARIN (PORCINE) 1,000 UNIT/ML IJ SOLN [10176]: 1000 mL | @ 14:00:00 | Stop: 2021-11-12 | NDC 63323054001

## 2021-11-12 MED ADMIN — SODIUM CHLORIDE 0.9 % IV SOLP [27838]: 1000 mL | @ 14:00:00 | Stop: 2021-11-12 | NDC 00264999900

## 2021-11-12 MED ADMIN — HEPARIN (PORCINE) 1,000 UNIT/ML IJ SOLN [10176]: 19000 [IU] | INTRAVENOUS | @ 14:00:00 | Stop: 2021-11-12 | NDC 63323054011

## 2021-11-12 MED ADMIN — BUPIVACAINE (PF) 0.25 % (2.5 MG/ML) IJ SOLN [87866]: 10 mL | @ 13:00:00 | Stop: 2021-11-12 | NDC 00409155918

## 2021-11-12 MED ADMIN — ISOPROTERENOL HCL 0.2 MG/ML IJ SOLN [4034]: 20 ug/min | INTRAVENOUS | @ 16:00:00 | Stop: 2021-11-12 | NDC 14789001507

## 2021-11-12 MED ADMIN — HEPARIN (PORCINE) IN 5 % DEX 20,000 UNIT/500 ML (40 UNIT/ML) IV SOLP [3628]: 2500 [IU]/h | INTRAVENOUS | @ 14:00:00 | Stop: 2021-11-12 | NDC 00264956710

## 2021-11-12 MED ADMIN — PANTOPRAZOLE 40 MG PO TBEC [80436]: 40 mg | ORAL | @ 21:00:00 | NDC 65862056099

## 2021-11-12 NOTE — Anesthesia Pre-Procedure Evaluation
Anesthesia Pre-Procedure Evaluation    Name: Tommy Miller      MRN: [id number]     DOB: 1955-12-24     Age: 66 y.o.     Sex: male   _________________________________________________________________________     Procedure Info:   Procedure Information     Date/Time: 11/12/21 0735    Procedures:       INTRACARDIAC CATHETER ABLATION WITH COMPREHENSIVE ELECTROPHYSIOLOGIC EVALUATION - ATRIAL FIBRILLATION - Afib ablation +/- TEE      TRANSESOPHAGEAL ECHOCARDIOGRAM DURING INTERVENTION      PERCUTANEOUS CLOSURE LEFT ATRIAL APPENDAGE WITH ENDOCARDIAL IMPLANT, TRANSEPTAL CATHETERIZATION, FOLEY, CONTRAST ANTICIPATED - WATCHMAN PROTOCOL, [employer]      TRANSESOPHAGEAL ECHOCARDIOGRAM DURING INTERVENTION - INTRA-OP TEE - 3-D TEE ECHO WITH PROBE    Location: CV LAB 04 / HC[facility] EP LAB    Providers: Lorain Childes, MD; Cath, Physician; Reola Mosher, MD; Vena Austria, MD          Physical Assessment  Vital Signs (last filed in past 24 hours):  BP: 137/81 (05/01 0633)  Temp: 37 ?C (98.6 ?F) (05/01 4540)  Pulse: 133 (05/01 9811)  Respirations: 18 PER MINUTE (05/01 0633)  SpO2: 96 % (05/01 0633)  Height: 180.3 cm (5' 11) (05/01 9147)  Weight: 109.2 kg (240 lb 11.9 oz) (05/01 8295)      Patient History   No Known Allergies     Current Medications    Medication Directions   amLODIPine (NORVASC) 10 mg tablet Take one tablet by mouth daily.  Patient taking differently: Take one tablet by mouth daily as needed (elevated BP).   apixaban (ELIQUIS) 5 mg tablet Take one tablet by mouth twice daily.   ascorbic acid (vitamin C) 500 mg tablet Take one tablet by mouth daily.   cetirizine (ZYRTEC) 10 mg tablet Take one tablet by mouth at bedtime daily.   CHOLEcalciferoL (vitamin D3) (VITAMIN D3) 1,000 units tablet Take 1 capsule in the morning and 2 capsules at bedtime   dronedarone (MULTAQ) 400 mg tablet Take one tablet by mouth twice daily with meals.   dulaglutide (TRULICITY) 1.5 mg/0.5 mL injection pen Inject 0.5 mL under the skin every 7 days.   fish oil /omega-3 fatty acids (SEA-OMEGA) 340/1000 mg capsule Take two capsules by mouth twice daily.   fluticasone propionate (FLONASE) 50 mcg/actuation nasal spray, suspension Apply two sprays to each nostril as directed daily. Shake bottle gently before using.  Patient taking differently: Apply two sprays to each nostril as directed at bedtime daily. Shake bottle gently before using.   losartan (COZAAR) 25 mg tablet Take one tablet by mouth daily.  Patient taking differently: Take one tablet by mouth at bedtime daily.   magnesium oxide (MAGOX) 400 mg (241.3 mg magnesium) tablet Take one tablet by mouth daily.   metFORMIN (GLUCOPHAGE) 500 mg tablet Take one tablet by mouth twice daily after meals. Indications: type 2 diabetes mellitus   metoprolol succinate XL (TOPROL XL) 100 mg extended release tablet Take 1 tab in am, 1 tab in pm  Patient taking differently: one tablet twice daily.   metroNIDAZOLE (METROGEL) 1 % topical gel Apply one g topically to affected area twice daily.   minocycline (MINOCIN) 100 mg capsule Take one capsule by mouth daily.  Patient taking differently: Take one capsule by mouth at bedtime daily.   montelukast (SINGULAIR) 10 mg tablet Take one tablet by mouth at bedtime daily.   omeprazole DR (PRILOSEC) 20 mg capsule TAKE 1 CAPSULE BY  MOUTH DAILY BEFORE BREAKFAST   pantoprazole DR (PROTONIX) 40 mg tablet Take one tablet by mouth twice daily for 45 days. Take one tablet by mouth twice daily for 7 days prior to procedure (11/12/21) then take one tablet by mouth twice daily for 30 days after procedure.   phenylephrine-cocoa butter (PREPARATION H(PE,CB)) 0.25-88.44 % rectal suppository Insert or Apply one suppository to rectal area as directed every 12 hours. Indications: hemorrhoids   rosuvastatin (CRESTOR) 20 mg tablet Take one tablet by mouth daily. Indications: high cholesterol and high triglycerides   vitamin E 400 unit capsule Take one capsule by mouth daily. Review of Systems/Medical History        PONV Screening: Non-smoker, Postoperative opioids and Hx PONV/motion sickness  No history of anesthetic complications  No family history of anesthetic complications      Airway - negative        Pulmonary      Not a current smoker        No indications/hx of asthma        Obstructive Sleep Apnea          Interventions: CPAP; compliant      Cardiovascular         Exercise tolerance: >4 METS      Hypertension,         Dysrhythmias (RBBB); atrial fibrillation      Hyperlipidemia      GI/Hepatic/Renal         GERD,       Liver disease (NAFLD):        No renal disease:        Neuro/Psych       No seizures      No CVA        Endocrine/Other       Diabetes, poorly controlled, type 2; using insulin      Most recent Hgb A1C:> 9      Obesity      Constitution - negative   Physical Exam    Airway Findings      Mallampati: II      TM distance: >3 FB      Neck ROM: full      Mouth opening: good      Airway patency: adequate    Dental Findings: Negative      Cardiovascular Findings:       Rhythm: irregular      Rate: normal    Pulmonary Findings:       Breath sounds clear to auscultation.    Abdominal Findings:       Obese    Neurological Findings:       Alert and oriented x 3    Constitutional findings:       No acute distress       Diagnostic Tests  Hematology:   Lab Results   Component Value Date    HGB 15.7 10/31/2021    HCT 44.9 10/31/2021    PLTCT 195 10/31/2021    WBC 6.4 10/31/2021    NEUT 56 05/29/2021    ANC 1.72 05/29/2021    ALC 0.88 05/29/2021    MONA 14 05/29/2021    AMC 0.44 05/29/2021    EOSA 1 05/29/2021    ABC 0.01 05/29/2021    MCV 95.9 10/31/2021    MCH 33.5 10/31/2021    MCHC 35.0 10/31/2021    MPV 9.9 10/31/2021    RDW 13.3 10/31/2021         General  Chemistry:   Lab Results   Component Value Date    NA 138 10/31/2021    K 3.8 10/31/2021    CL 101 10/31/2021    CO2 26 10/31/2021    GAP 11 10/31/2021    BUN 14 10/31/2021    CR 1.07 10/31/2021    GLU 97 10/31/2021    CA 9.4 10/31/2021    ALBUMIN 4.5 08/17/2021    MG 2.2 10/31/2021    TOTBILI 1.2 08/17/2021      Coagulation:   Lab Results   Component Value Date    INR 1.1 11/12/2021       Treadmill stress test 3/32/21  1. No evidence of stress induced ischemia   2. Low risk test     EKG 10/30/21  IMPRESSION  Normal sinus rhythm  Right bundle branch block  Septal infarct (cited on or before 17-Aug-2021)  Abnormal ECG  When compared with ECG of 17-Aug-2021 09:40,  Sinus rhythm has replaced Atrial fibrillation  Questionable change in initial forces of Anterior leads  Non-specific change in ST segment in Inferior leads  Nonspecific T wave abnormality now evident in Inferior leads  T wave inversion no longer evident in Anterolateral leads    PFT's 04/28/19  FVC-Pre 3.89    L Final   FVC-%Pred-pre 86    % Final   FVC-Post 3.74    L Final   FVC-%Pred-Post 83    % Final   FEV1-Pre 2.90    L Final   FEV1-%Pred-Pre 84    % Final   FEV1-Post 2.98    L Final   FEV1-%Pred-Post 86    % Final   FEV1/FVC-Pre 75    % Final   FEV1FVC-LLN 65    % Final     PAC plan  Labs: CBC, CMP on DOS  ROI: none  Studies: none  Consults: none     Anesthesia Plan    ASA score: 3   Plan: general  Induction method: intravenous  NPO status: acceptable      Informed Consent  Anesthetic plan and risks discussed with patient.  Use of blood products discussed with patient  Blood Consent: consented      Plan discussed with: CRNA, surgeon/proceduralist and anesthesiologist.

## 2021-11-12 NOTE — Anesthesia Post-Procedure Evaluation
Post-Anesthesia Evaluation    Name: Tommy Miller      MRN: [id number]     DOB: 08-17-1955     Age: 66 y.o.     Sex: male   __________________________________________________________________________     Procedure Information     Anesthesia Start Date/Time: 11/12/21 0742    Procedures:       INTRACARDIAC CATHETER ABLATION WITH COMPREHENSIVE ELECTROPHYSIOLOGIC EVALUATION - ATRIAL FIBRILLATION - Afib ablation +/- TEE      PERCUTANEOUS CLOSURE LEFT ATRIAL APPENDAGE WITH ENDOCARDIAL IMPLANT, TRANSEPTAL CATHETERIZATION, FOLEY, CONTRAST ANTICIPATED - WATCHMAN PROTOCOL, [employer]      TRANSESOPHAGEAL ECHOCARDIOGRAM DURING INTERVENTION - INTRA-OP TEE - 3-D TEE ECHO WITH PROBE    Location: CV LAB 04 / HC2 EP LAB    Providers: Clydia Llano, MD; Delrae Rend, MD; Jaclynn Major, MD          Post-Anesthesia Vitals  BP: 104/70 (05/01 1430)  Temp: 37.3 C (99.2 F) (05/01 1400)  Pulse: 92 (05/01 1430)  Respirations: 16 PER MINUTE (05/01 1430)  SpO2: 93 % (05/01 1430)  SpO2 Pulse: 92 (05/01 1430)  Height: 180.3 cm (5\' 11" ) (05/01 1423)   Vitals Value Taken Time   BP 104/70 11/12/21 1430   Temp 37.3 C (99.2 F) 11/12/21 1400   Pulse 92 11/12/21 1430   Respirations 16 PER MINUTE 11/12/21 1430   SpO2 93 % 11/12/21 1430   O2 Device     ABP     ART BP           Post Anesthesia Evaluation Note    Evaluation location: Pre/Post  Patient participation: recovered; patient participated in evaluation  Level of consciousness: alert  Pain management: adequate    Hydration: normovolemia  Temperature: 36.0C - 38.4C  Airway patency: adequate    Perioperative Events       Post-op nausea and vomiting: no PONV    Postoperative Status  Cardiovascular status: hemodynamically stable  Respiratory status: spontaneous ventilation  Follow-up needed: none        Perioperative Events  There were no known notable events for this encounter.

## 2021-11-13 ENCOUNTER — Encounter: Admit: 2021-11-13 | Discharge: 2021-11-13 | Payer: MEDICARE

## 2021-11-13 ENCOUNTER — Inpatient Hospital Stay: Admit: 2021-11-13 | Discharge: 2021-11-13 | Payer: MEDICARE

## 2021-11-13 DIAGNOSIS — S4991XA Unspecified injury of right shoulder and upper arm, initial encounter: Secondary | ICD-10-CM

## 2021-11-13 DIAGNOSIS — R002 Palpitations: Secondary | ICD-10-CM

## 2021-11-13 DIAGNOSIS — I10 Essential (primary) hypertension: Secondary | ICD-10-CM

## 2021-11-13 DIAGNOSIS — G4733 Obstructive sleep apnea (adult) (pediatric): Secondary | ICD-10-CM

## 2021-11-13 DIAGNOSIS — R112 Nausea with vomiting, unspecified: Secondary | ICD-10-CM

## 2021-11-13 DIAGNOSIS — I4891 Unspecified atrial fibrillation: Secondary | ICD-10-CM

## 2021-11-13 DIAGNOSIS — I480 Paroxysmal atrial fibrillation: Secondary | ICD-10-CM

## 2021-11-13 DIAGNOSIS — K219 Gastro-esophageal reflux disease without esophagitis: Secondary | ICD-10-CM

## 2021-11-13 MED ADMIN — LOSARTAN 50 MG PO TAB [76938]: 25 mg | ORAL | @ 22:00:00 | NDC 65862020290

## 2021-11-13 MED ADMIN — CETIRIZINE 10 MG PO TAB [77730]: 10 mg | ORAL | @ 02:00:00 | NDC 00904671760

## 2021-11-13 MED ADMIN — FLUTICASONE PROPIONATE 50 MCG/ACTUATION NA SPSN [70536]: 2 | NASAL | @ 02:00:00 | NDC 60505082901

## 2021-11-13 MED ADMIN — APIXABAN 5 MG PO TAB [315778]: 5 mg | ORAL | @ 13:00:00 | NDC 00003089431

## 2021-11-13 MED ADMIN — ASPIRIN 81 MG PO TBEC [14113]: 81 mg | ORAL | @ 13:00:00 | NDC 00536123441

## 2021-11-13 MED ADMIN — DRONEDARONE 400 MG PO TAB [173842]: 400 mg | ORAL | @ 13:00:00 | NDC 00024414260

## 2021-11-13 MED ADMIN — EUCALYPTUS-MENTHOL MM LOZG [83613]: 1 | ORAL | @ 02:00:00 | NDC 12546062970

## 2021-11-13 MED ADMIN — METOPROLOL SUCCINATE 50 MG PO TB24 [77931]: 100 mg | ORAL | @ 13:00:00 | NDC 00904632361

## 2021-11-13 MED ADMIN — DRONEDARONE 400 MG PO TAB [173842]: 400 mg | ORAL | @ 22:00:00 | NDC 00024414260

## 2021-11-13 MED ADMIN — LOSARTAN 50 MG PO TAB [76938]: 25 mg | ORAL | @ 02:00:00 | NDC 65862020290

## 2021-11-13 MED ADMIN — PANTOPRAZOLE 40 MG PO TBEC [80436]: 40 mg | ORAL | @ 13:00:00 | NDC 65862056099

## 2021-11-13 MED ADMIN — COLCHICINE 0.6 MG PO TAB [1821]: 0.6 mg | ORAL | @ 15:00:00 | NDC 70010000201

## 2021-11-13 MED ADMIN — APIXABAN 5 MG PO TAB [315778]: 5 mg | ORAL | @ 01:00:00 | NDC 00003089431

## 2021-11-13 MED ADMIN — PANTOPRAZOLE 40 MG PO TBEC [80436]: 40 mg | ORAL | @ 02:00:00 | NDC 65862056099

## 2021-11-13 MED ADMIN — POTASSIUM CHLORIDE 20 MEQ PO TBTQ [35943]: 20 meq | ORAL | @ 15:00:00 | Stop: 2021-11-13 | NDC 00832532511

## 2021-11-13 MED ADMIN — SUCRALFATE 1 GRAM PO TAB [11442]: 1 g | ORAL | @ 02:00:00 | NDC 00093221005

## 2021-11-13 MED ADMIN — SUCRALFATE 1 GRAM PO TAB [11442]: 1 g | ORAL | @ 13:00:00 | NDC 00093221005

## 2021-11-13 MED ADMIN — POLYETHYLENE GLYCOL 3350 17 GRAM PO PWPK [25424]: 17 g | ORAL | @ 18:00:00 | NDC 00904693186

## 2021-11-13 MED ADMIN — MONTELUKAST 10 MG PO TAB [81627]: 10 mg | ORAL | @ 02:00:00 | NDC 00904680861

## 2021-11-13 MED ADMIN — METOPROLOL SUCCINATE 50 MG PO TB24 [77931]: 100 mg | ORAL | @ 02:00:00 | NDC 00904632361

## 2021-11-13 MED ADMIN — ROSUVASTATIN 20 MG PO TAB [88504]: 20 mg | ORAL | @ 13:00:00 | NDC 68462026390

## 2021-11-13 MED ADMIN — METFORMIN 500 MG PO TAB [10544]: 500 mg | ORAL | @ 13:00:00 | NDC 70010006301

## 2021-11-13 NOTE — Telephone Encounter
Called patient's wife to ask where they wanted orders to be sent for Echo.  Per Tommy Miller [facility] office if possible.  Her other options were [facility] in [place] or [facility] in [place].  Will notify patient once I can get this scheduled since it is short notice.

## 2021-11-13 NOTE — Telephone Encounter
-----   Message from [place], APRN-NP sent at 11/13/2021  9:43 AM CDT -----  Regarding: change repeat echo for one week, not two  Per Dr. Micheline Rough - repeat echo in one week, not two (to assess pericaridal effusion). Thanks

## 2021-11-13 NOTE — Telephone Encounter
[facility] is scheduling out too far.  Called [facility] in [place] and they can get him in on 11/19/2021 at 10:00 am.  Orders entered and faxed.  Patient notified.

## 2021-11-14 ENCOUNTER — Inpatient Hospital Stay: Admit: 2021-11-14 | Discharge: 2021-11-14 | Payer: MEDICARE

## 2021-11-14 ENCOUNTER — Encounter: Admit: 2021-11-14 | Discharge: 2021-11-14 | Payer: MEDICARE

## 2021-11-14 DIAGNOSIS — I4891 Unspecified atrial fibrillation: Secondary | ICD-10-CM

## 2021-11-14 DIAGNOSIS — G4733 Obstructive sleep apnea (adult) (pediatric): Secondary | ICD-10-CM

## 2021-11-14 DIAGNOSIS — S4991XA Unspecified injury of right shoulder and upper arm, initial encounter: Secondary | ICD-10-CM

## 2021-11-14 DIAGNOSIS — R002 Palpitations: Secondary | ICD-10-CM

## 2021-11-14 DIAGNOSIS — I10 Essential (primary) hypertension: Secondary | ICD-10-CM

## 2021-11-14 DIAGNOSIS — R112 Nausea with vomiting, unspecified: Secondary | ICD-10-CM

## 2021-11-14 DIAGNOSIS — K219 Gastro-esophageal reflux disease without esophagitis: Secondary | ICD-10-CM

## 2021-11-14 MED ADMIN — POLYETHYLENE GLYCOL 3350 17 GRAM PO PWPK [25424]: 17 g | ORAL | @ 13:00:00 | Stop: 2021-11-14 | NDC 00904693186

## 2021-11-14 MED ADMIN — APIXABAN 5 MG PO TAB [315778]: 5 mg | ORAL | @ 13:00:00 | Stop: 2021-11-14 | NDC 00003089431

## 2021-11-14 MED ADMIN — COLCHICINE 0.6 MG PO TAB [1821]: 0.6 mg | ORAL | @ 13:00:00 | Stop: 2021-11-14 | NDC 70010000201

## 2021-11-14 MED ADMIN — APIXABAN 5 MG PO TAB [315778]: 5 mg | ORAL | @ 02:00:00 | NDC 00003089431

## 2021-11-14 MED ADMIN — MONTELUKAST 10 MG PO TAB [81627]: 10 mg | ORAL | @ 02:00:00 | NDC 00904680861

## 2021-11-14 MED ADMIN — METOPROLOL SUCCINATE 50 MG PO TB24 [77931]: 100 mg | ORAL | @ 13:00:00 | Stop: 2021-11-14 | NDC 00904632361

## 2021-11-14 MED ADMIN — SUCRALFATE 1 GRAM PO TAB [11442]: 1 g | ORAL | @ 13:00:00 | Stop: 2021-11-14 | NDC 00093221005

## 2021-11-14 MED ADMIN — ROSUVASTATIN 20 MG PO TAB [88504]: 20 mg | ORAL | @ 13:00:00 | Stop: 2021-11-14 | NDC 68462026390

## 2021-11-14 MED ADMIN — METFORMIN 500 MG PO TAB [10544]: 500 mg | ORAL | NDC 70010006301

## 2021-11-14 MED ADMIN — PANTOPRAZOLE 40 MG PO TBEC [80436]: 40 mg | ORAL | @ 02:00:00 | NDC 65862056099

## 2021-11-14 MED ADMIN — MINOCYCLINE 100 MG PO CAP [5110]: 100 mg | ORAL | @ 02:00:00 | NDC 68382031818

## 2021-11-14 MED ADMIN — CETIRIZINE 10 MG PO TAB [77730]: 10 mg | ORAL | @ 02:00:00 | NDC 00904671760

## 2021-11-14 MED ADMIN — METFORMIN 500 MG PO TAB [10544]: 500 mg | ORAL | @ 13:00:00 | Stop: 2021-11-14 | NDC 70010006301

## 2021-11-14 MED ADMIN — SUCRALFATE 1 GRAM PO TAB [11442]: 1 g | ORAL | @ 04:00:00 | NDC 00093221005

## 2021-11-14 MED ADMIN — METOPROLOL SUCCINATE 50 MG PO TB24 [77931]: 100 mg | ORAL | @ 02:00:00 | NDC 00904632361

## 2021-11-14 MED ADMIN — ASPIRIN 81 MG PO TBEC [14113]: 81 mg | ORAL | @ 13:00:00 | Stop: 2021-11-14 | NDC 00536123441

## 2021-11-14 MED ADMIN — DRONEDARONE 400 MG PO TAB [173842]: 400 mg | ORAL | @ 13:00:00 | Stop: 2021-11-14 | NDC 00024414260

## 2021-11-19 ENCOUNTER — Encounter: Admit: 2021-11-19 | Discharge: 2021-11-19 | Payer: MEDICARE

## 2021-11-19 NOTE — Telephone Encounter
Call to the patient in follow up to a-fib ablation completed 11/12/21. 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     Patient denies chest pain, denies persistent nausea, vomiting or fullness, denies dysphagia, denies odynophagia, denies hematemesis/ melena, denies confusion, denies symptoms of stroke/TIA / STE, denies fever, chills rigors, denies symptoms of sepsis or systemic infection.  Patient denies dysuria, denies urinary frequency.  Patient denies swelling, denies bleeding, denies groin site pain.  Patient denies shortness of breath, denies weight gain, denies BLE swelling.     Verified appointment date/time/location with the patient for 1 mo post LAAA follow up. Scheduled on 12/28/21.    Patient will plan to call the office with any change in symptoms, questions or concerns.

## 2021-11-19 NOTE — Telephone Encounter
-----   Message from Evette Georges, [place] sent at 08/17/2021 11:29 AM CST -----  Regarding: RRR - 1 week phone call s/p LAAA on 5/1  RRR - 1 week phone call s/p LAAA on 5/1

## 2021-11-20 ENCOUNTER — Encounter: Admit: 2021-11-20 | Discharge: 2021-11-20 | Payer: MEDICARE

## 2021-11-20 NOTE — Telephone Encounter
3 month post Ablation/Watchman office visit and TEE (transesophageal echocardiogram)     Watchman implant completed 11/12/2021 with Dr. Micheline Rough (Dr. Derrell Lolling doing ablation).   Called patient to follow up with patient post implantation and schedule Watchman follow up appointment including TEE.    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   Patient denies signs of bleeding.  Patient denies symptoms of stroke/TIA.  Patient denies swelling, bleeding or groin site pain.  Patient denies new chest pain or new shortness of breath.  Patient denies any hospitalizations or changes in OAC/ASA status since discharge from [facility] implant.     11/19/2021 at 10:00   Echocardiogram and platelet count at [facility]   12/28/2021 at 09:00 am   Office Visit with Jene Every APRN at the [facility]     02/05/2022 - 02/12/2022 TEE (transesophageal echocardiogram)  Orders have been placed for you to get the TEE at [facility] in [place].  They will call you to schedule.     02/15/2022 at 1:00 pm Office Visit with Dr. Derrell Lolling at the [facility].     6 month post Watchman appointment:  05/21/2022 at 3:00 pm Telehealth Visit with Dr. Micheline Rough via telehealth.     Device Related Thrombus reduction:  Advised patient that they are to continue to take the Eliquis and Aspirin until otherwise informed by Dr. Lars Masson team.  It is imperative they continue to take these medications as prescribed to reduce the risk of device related thrombus.  If they have any questions or concerns they are to call the office at [phone] as soon as possible but not to make any medication changes without the physician's instructions.     Patient verbalized understanding of date/time/location of all above appointments.

## 2021-11-21 DIAGNOSIS — H35033 Hypertensive retinopathy, bilateral: Secondary | ICD-10-CM | POA: Diagnosis not present

## 2021-11-21 DIAGNOSIS — H52223 Regular astigmatism, bilateral: Secondary | ICD-10-CM | POA: Diagnosis not present

## 2021-11-21 DIAGNOSIS — E119 Type 2 diabetes mellitus without complications: Secondary | ICD-10-CM | POA: Diagnosis not present

## 2021-11-21 DIAGNOSIS — I10 Essential (primary) hypertension: Secondary | ICD-10-CM | POA: Diagnosis not present

## 2021-11-27 ENCOUNTER — Ambulatory Visit (INDEPENDENT_AMBULATORY_CARE_PROVIDER_SITE_OTHER): Payer: Medicare Other

## 2021-11-27 ENCOUNTER — Telehealth: Payer: Medicare HMO

## 2021-11-27 DIAGNOSIS — E119 Type 2 diabetes mellitus without complications: Secondary | ICD-10-CM

## 2021-11-27 DIAGNOSIS — R030 Elevated blood-pressure reading, without diagnosis of hypertension: Secondary | ICD-10-CM

## 2021-11-27 DIAGNOSIS — E7800 Pure hypercholesterolemia, unspecified: Secondary | ICD-10-CM

## 2021-11-27 DIAGNOSIS — I2510 Atherosclerotic heart disease of native coronary artery without angina pectoris: Secondary | ICD-10-CM

## 2021-11-27 NOTE — Chronic Care Management (AMB) (Signed)
?Chronic Care Management  ? ?CCM RN Visit Note ? ?11/27/2021 ?Name: Jeff Jenkins MRN: [id number] DOB: [birth year] ? ?Subjective: ?Jeff Jenkins is a 66 y.o. year old male who is a primary care patient of Isaac Bliss, Rayford Halsted, MD. The care management team was consulted for assistance with disease management and care coordination needs.   ? ?Engaged with patient by telephone for follow up visit in response to provider referral for case management and/or care coordination services.  ? ?Consent to Services:  ?The patient was given information about Chronic Care Management services, agreed to services, and gave verbal consent prior to initiation of services.  Please see initial visit note for detailed documentation.  ? ?Patient agreed to services and verbal consent obtained.  ? ?Assessment: Review of patient past medical history, allergies, medications, health status, including review of consultants reports, laboratory and other test data, was performed as part of comprehensive evaluation and provision of chronic care management services.  ? ?SDOH (Social Determinants of Health) assessments and interventions performed:   ? ?CCM Care Plan ? ?No Known Allergies ? ?Outpatient Encounter Medications as of 11/27/2021  ?Medication Sig  ? acetaminophen (TYLENOL) 325 MG tablet Take 650 mg by mouth every 6 (six) hours as needed for moderate pain.  ? aspirin EC 81 MG tablet Take 81 mg by mouth daily. Swallow whole.  ? atorvastatin (LIPITOR) 40 MG tablet Take 1 tablet (40 mg total) by mouth daily.  ? azelastine (ASTELIN) 0.1 % nasal spray Place 1 spray into both nostrils 2 (two) times daily. Use in each nostril as directed  ? Calcium Carbonate Antacid (TUMS PO) Take 2 tablets by mouth as needed.  ? dicyclomine (BENTYL) 10 MG capsule Take 1 capsule (10 mg total) by mouth 3 (three) times daily as needed for spasms.  ? esomeprazole (NEXIUM) 40 MG capsule TAKE 1 CAPSULE DAILY BEFORE BREAKFAST.  ? ezetimibe (ZETIA) 10 MG  tablet Take 1 tablet (10 mg total) by mouth daily.  ? fexofenadine (ALLEGRA) 180 MG tablet Take 180 mg by mouth daily.  ? Lancets (ONETOUCH ULTRASOFT) lancets 1 each by Other route daily. Dx E11.9  ? lisinopril (ZESTRIL) 5 MG tablet Take 1 tablet (5 mg total) by mouth daily.  ? methocarbamol (ROBAXIN) 500 MG tablet Take 500 mg by mouth every 6 (six) hours as needed.  ? ONETOUCH ULTRA test strip CHECK BLOOD SUGAR DAILY  ? sildenafil (VIAGRA) 100 MG tablet TAKE 1 TABLET BY MOUTH DAILY AS NEEDED  ? SYNJARDY XR 25-1000 MG TB24 TAKE 1 TABLET EVERY DAY  ? tadalafil (CIALIS) 5 MG tablet Take 5 mg by mouth daily as needed for erectile dysfunction.   ? Testosterone 30 MG/ACT SOLN Place onto the skin daily.  ? ?No facility-administered encounter medications on file as of 11/27/2021.  ? ? ?Patient Active Problem List  ? Diagnosis Date Noted  ? Chest pain of uncertain etiology 54/27/0623  ? Prostate cancer ([place]) 09/20/2020  ? Erectile dysfunction 09/15/2019  ? Nonocclusive coronary atherosclerosis of native coronary artery 05/20/2019  ? Coronary artery calcification seen on CT scan 05/20/2019  ? Type 2 diabetes mellitus without obesity ([place]) 03/25/2019  ? PVC's (premature ventricular contractions) 03/25/2019  ? Elevated blood pressure reading in office with white coat syndrome, without diagnosis of hypertension 03/25/2019  ? Pure hypercholesterolemia 03/25/2019  ? History of esophageal stricture 02/01/2015  ? Special screening for malignant neoplasms, colon 10/15/2010  ? HEMORRHOIDS-EXTERNAL 09/15/2008  ? HEMORRHOIDS 09/15/2008  ? GERD 09/15/2008  ? ? ?  Conditions to be addressed/monitored:CAD, HTN, HLD, and DMII ? ?Care Plan : RN Care Manager Plan of Care  ?Updates made by Dimitri Ped, RN since 11/27/2021 12:00 AM  ?  ? ?Problem: Chronic Disease Management and Care Coordination Needs (DM,CAD,HTN and HLD)   ?Priority: High  ?  ? ?Long-Range Goal: Establish Plan of Care for Chronic Disease Management Needs (DM,CAD,HTN and  HLD)   ?Start Date: 06/26/2021  ?Expected End Date: 11/27/2022  ?Priority: High  ?Note:   ?Current Barriers:  ?Chronic Disease Management support and education needs related to CAD, HTN, HLD, and DMII  ?States he had his hernias surgery and he is recovered now.  States he is back to being active and can do lifting now. States he is taking the [place] without any issues.   States his CBGs are ranging from 113-139.  States he has been bowling twice a week  and does yard work Electronics engineer for exercise. States he tries to eat healthy but will have a sweet [place] about once a week. States his B/P has been good when he has checked and it was 122/79 this morning  Denies any chest pains, shortness of breath or swelling.    ? ?RNCM Clinical Goal(s):  ?Patient will verbalize understanding of plan for management of CAD, HTN, HLD, and DMII as evidenced by voiced adherence to plan of care ?verbalize basic understanding of  CAD, HTN, HLD, and DMII disease process and self health management plan as evidenced by voiced understanding and teach back ?take all medications exactly as prescribed and will call provider for medication related questions as evidenced by dispense report and pt verbalization ?attend all scheduled medical appointments: Dr. Jerilee Hoh 12/26/21, Cardiology 12/24/21 as evidenced by medical records ?demonstrate Improved adherence to prescribed treatment plan for CAD, HTN, HLD, and DMII as evidenced by readings within limits, voiced adherence to plan of care  through collaboration with RN Care manager, provider, and care team.  ? ?Interventions: ?1:1 collaboration with primary care provider regarding development and update of comprehensive plan of care as evidenced by provider attestation and co-signature ?Inter-disciplinary care team collaboration (see longitudinal plan of care) ?Evaluation of current treatment plan related to  self management and patient's adherence to plan as established by provider ? ? ?CAD  Interventions: (Status:  Goal on track:  Yes.) Long Term Goal ?Assessed understanding of CAD diagnosis ?Medications reviewed including medications utilized in CAD treatment plan ?Provided education on importance of blood pressure control in management of CAD ?Provided education on Importance of limiting foods high in cholesterol ?Counseled on importance of regular laboratory monitoring as prescribed ?Counseled on the importance of exercise goals with target of 150 minutes per week ?Reviewed Importance of taking all medications as prescribed ?Reviewed Importance of attending all scheduled provider appointments ?Advised to report any changes in symptoms or exercise tolerance ?Reinforced to look into using his Silver Sneakers benefit to go to gym on bad weather days or walk at a big box store for exercise ? ? ?Diabetes Interventions:  (Status:  Goal on track:  Yes.) Long Term Goal ?Assessed patient's understanding of A1c goal: <6.5% ?Provided education to patient about basic DM disease process ?Reviewed medications with patient and discussed importance of medication adherence ?Counseled on importance of regular laboratory monitoring as prescribed ?Discussed plans with patient for ongoing care management follow up and provided patient with direct contact information for care management team ?Advised patient, providing education and rationale, to check cbg 1-2 times a week and record, calling  provider for findings outside established parameters ?Review of patient status, including review of consultants reports, relevant laboratory and other test results, and medications completed ?Reinforced to try to cut out sweet tea by starting by getting 1/2 sweet/un sweet or try using artifical sweetener instead. Reinforced importance of regular exercise to help with blood sugar control ?Lab Results  ?Component Value Date  ? HGBA1C 6.2 (A) 09/25/2021  ? ?Hyperlipidemia Interventions:  (Status:  Condition stable.  Not addressed this  visit.) Long Term Goal ?Medication review performed; medication list updated in electronic medical record.  ?Provider established cholesterol goals reviewed ?Counseled on importance of regular laboratory

## 2021-11-27 NOTE — Patient Instructions (Signed)
Visit Information ? ?Thank you for taking time to visit with me today. Please don't hesitate to contact me if I can be of assistance to you before our next scheduled telephone appointment. ? ?Following are the goals we discussed today:  ?Take all medications as prescribed ?Attend all scheduled provider appointments ?Call pharmacy for medication refills 3-7 days in advance of running out of medications ?Perform all self care activities independently  ?Perform IADL's (shopping, preparing meals, housekeeping, managing finances) independently ?Call provider office for new concerns or questions  ?keep appointment with eye doctor ?check blood sugar at prescribed times: when you have symptoms of low or high blood sugar and 1-2 times a week ?check feet daily for cuts, sores or redness ?take the blood sugar log to all doctor visits ?drink 6 to 8 glasses of water each day ?fill half of plate with vegetables ?manage portion size ?prepare main meal at home 3 to 5 days each week ?switch to sugar-free drinks ?wash and dry feet carefully every day ?Look into Silver Sneakers benefit ?check blood pressure weekly ?choose a place to take my blood pressure (home, clinic or office, retail store) ?take blood pressure log to all doctor appointments ?keep all doctor appointments ?eat more whole grains, fruits and vegetables, lean meats and healthy fats ?limit salt intake to '2300mg'$ /day ?call for medicine refill 2 or 3 days before it runs out ?take all medications exactly as prescribed ?call doctor with any symptoms you believe are related to your medicine ? ?Our next appointment is by telephone on 02/26/22 at 10 AM ? ?Please call the care guide team at [phone] if you need to cancel or reschedule your appointment.  ? ?If you are experiencing a Mental Health or [place] or need someone to talk to, please call the Suicide and Crisis Lifeline: 988 ?call the [place] National Suicide Prevention Lifeline: (778)594-0340 or TTY:  365-238-4986 TTY 912-372-0164) to talk to a trained counselor ?call 1-800-273-TALK (toll free, 24 hour hotline) ?go to [facility] [address], [place] [phone]) ?call 911  ? ?Patient verbalizes understanding of instructions and care plan provided today and agrees to view in [place]. Active MyChart status confirmed with patient.   ? ?Peter Garter RN, BSN,CCM, CDE ?Care Management Coordinator ?[facility] ?[phone]   ?

## 2021-12-03 ENCOUNTER — Encounter: Admit: 2021-12-03 | Discharge: 2021-12-03 | Payer: MEDICARE

## 2021-12-04 ENCOUNTER — Encounter: Admit: 2021-12-04 | Discharge: 2021-12-04 | Payer: MEDICARE

## 2021-12-08 ENCOUNTER — Other Ambulatory Visit: Payer: Self-pay | Admitting: Gastroenterology

## 2021-12-12 DIAGNOSIS — E785 Hyperlipidemia, unspecified: Secondary | ICD-10-CM | POA: Diagnosis not present

## 2021-12-12 DIAGNOSIS — I2510 Atherosclerotic heart disease of native coronary artery without angina pectoris: Secondary | ICD-10-CM

## 2021-12-12 DIAGNOSIS — E1159 Type 2 diabetes mellitus with other circulatory complications: Secondary | ICD-10-CM | POA: Diagnosis not present

## 2021-12-12 DIAGNOSIS — I10 Essential (primary) hypertension: Secondary | ICD-10-CM

## 2021-12-19 ENCOUNTER — Other Ambulatory Visit: Payer: Self-pay | Admitting: Internal Medicine

## 2021-12-19 DIAGNOSIS — E7800 Pure hypercholesterolemia, unspecified: Secondary | ICD-10-CM

## 2021-12-24 ENCOUNTER — Ambulatory Visit (HOSPITAL_BASED_OUTPATIENT_CLINIC_OR_DEPARTMENT_OTHER): Payer: Medicare HMO | Admitting: Cardiology

## 2021-12-24 ENCOUNTER — Encounter (HOSPITAL_BASED_OUTPATIENT_CLINIC_OR_DEPARTMENT_OTHER): Payer: Self-pay | Admitting: Family

## 2021-12-24 ENCOUNTER — Ambulatory Visit (INDEPENDENT_AMBULATORY_CARE_PROVIDER_SITE_OTHER): Payer: Medicare Other | Admitting: Family

## 2021-12-24 VITALS — BP 136/84 | HR 80 | Ht 72.0 in | Wt 176.0 lb

## 2021-12-24 DIAGNOSIS — E785 Hyperlipidemia, unspecified: Secondary | ICD-10-CM | POA: Diagnosis not present

## 2021-12-24 DIAGNOSIS — I25118 Atherosclerotic heart disease of native coronary artery with other forms of angina pectoris: Secondary | ICD-10-CM | POA: Diagnosis not present

## 2021-12-24 DIAGNOSIS — I493 Ventricular premature depolarization: Secondary | ICD-10-CM

## 2021-12-24 DIAGNOSIS — E119 Type 2 diabetes mellitus without complications: Secondary | ICD-10-CM | POA: Diagnosis not present

## 2021-12-24 DIAGNOSIS — I2510 Atherosclerotic heart disease of native coronary artery without angina pectoris: Secondary | ICD-10-CM

## 2021-12-24 NOTE — Progress Notes (Signed)
Office Visit    Patient Name: Jeff Jenkins Date of Encounter: 12/24/2021  PCP:  Isaac Bliss, Rayford Halsted, MD   [place]  Cardiologist:  Buford Dresser, MD  Advanced Practice Provider:  No care team member to display Electrophysiologist:  None     Chief Complaint    Jeff Jenkins is a 66 y.o. male with a hx of nonobstructive CAD, DM 2, hyperlipidemia, vitamin B12 deficiency presents today for follow up of coronary disease.   Past Medical History    Past Medical History:  Diagnosis Date   Allergy    Arthritis    Broken ankle 2008   Coronary artery disease    Diverticulosis    ED (erectile dysfunction)    Facial fracture ([place]) 1979   GERD (gastroesophageal reflux disease)    Hearing loss in right ear    Hemorrhoids    Hiatal hernia    Hyperlipidemia    Hypertension    Peptic stricture of esophagus    Peptic ulcer disease 1989   Prostate cancer ([place]) 02/2009   Seasonal allergies    Type 2 diabetes mellitus ([place])    Past Surgical History:  Procedure Laterality Date   ANKLE SURGERY Left 2008   CHOLECYSTECTOMY N/A 01/31/2020   Procedure: LAPAROSCOPIC CHOLECYSTECTOMY WITH INTRAOPERATIVE CHOLANGIOGRAM;  Surgeon: Clovis Riley, MD;  Location: [facility];  Service: General;  Laterality: N/A;   FACIAL FRACTURE SURGERY     HERNIA REPAIR  09/26/2021   Sugery on 4 hernias in groin area  Dr [facility]   INGUINAL HERNIA REPAIR Left    KNEE ARTHROSCOPY Left 2013   KNEE ARTHROSCOPY  07/03/2020   [facility]   PROSTATECTOMY  2011   TONSILLECTOMY     UPPER GASTROINTESTINAL ENDOSCOPY     VASECTOMY      Allergies  No Known Allergies  History of Present Illness    Jeff Jenkins is a 66 y.o. male with a hx of nonobstructive CAD, DM 2, hyperlipidemia, vitamin B12 deficiency last seen 06/22/21 by Dr. Harrell Gave.  Prior cardiac CTA November 2020 with diffuse three-vessel nonobstructive coronary disease.  Monitor  at that time with predominantly normal sinus rhythm with PVC burden of 2.6%.  Last seen 06/22/2021 by Dr. Harrell Gave for preop clearance for left total knee arthroplasty.  He was doing well from a cardiac perspective at that time and no changes were made.  Presents today for follow up with his wife. Since last seen he had hernia repair both umbilical and inguinal. Did not have knee surgery but he had cortisone injection in January with his orthopedist. Has had good relief of pain. 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 Reports no shortness of breath nor dyspnea on exertion with his exercise. Does have slightly dyspnea when climbing to the top of 19 steps from his basement but this is unchanged from prior and overall not bothersome. Reports no chest pain, pressure, or tightness. No edema, orthopnea, PND. Reports no palpitations. Average BP at home 119/70 with arm cuff.   EKGs/Labs/Other Studies Reviewed:   The following studies were reviewed today: Cardiac CT 05-18-19 Aorta: Mildly dilated aortic root (42 mm). Minimal calcifications. No dissection.   Aortic Valve:  Trileaflet.  No calcifications.  Coronary Arteries:  Normal coronary origin.  Right dominance.   RCA is a large dominant artery that gives rise to PDA and PLVB. There is diffuse minimal  calcified plaque (0-24%); mild calcified plaque (25-49%) at takeoff of PDA. Minimal plaque (0-24%) in posterolateral.   Left main is a large artery that gives rise to LAD and LCX arteries. There is no plaque.   LAD is a large vessel that has 2 diagonals. There is calcified plaque in the proximal (50-69%) and mid vessel (0-24%); there is mild calcified plaque (25-49%) in proximal D1.   LCX is a non-dominant artery that gives rise to small OM1 and OM2. There is minimal calcified plaque (0-24%) noted.   Other findings:  Normal pulmonary vein drainage into the left atrium.  Normal let atrial  appendage without a thrombus.  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   IMPRESSION: 1. Coronary calcium score of 2319. This was 74 percentile for age and sex matched control.   2. Normal coronary origin with right dominance.   3. Diffuse nonobstructive 3 vessel calcified plaque; 50-69% stenosis in proximal LAD; 25-49% stenosis proximal D1 and in RCA at takeoff of PDA. CADRADS-3.   Monitor report 04/2019 14 days of data recorded on Zio monitor. Patient had a min HR of 48 bpm, max HR of 184 bpm, and avg HR of 86 bpm. Predominant underlying rhythm was Sinus Rhythm. No VT, SVT, atrial fibrillation, high degree block, or pauses noted. Isolated atrial ectopy was rare (<1%). Isolated ventricular ectopy was occasional (2.6%). There was a single 4 beat run of SVT and a single 4 beat run of PVCs.  Longest ventricular bigeminy was 23 seconds, and longest ventricular trigeminy episode was 38 seconds. There were 0 triggered events.       EKG:  EKG is ordered today.  The ekg ordered today demonstrates NSR 80 bpm with no acute ST/T wave changes.   Recent Labs: No results found for requested labs within last 365 days.  Recent Lipid Panel    Component Value Date/Time   CHOL 139 06/22/2020 1052   CHOL 170 08/23/2019 1221   TRIG 57 06/22/2020 1052   HDL 73 06/22/2020 1052   HDL 66 08/23/2019 1221   CHOLHDL 1.9 06/22/2020 1052   LDLCALC 53 06/22/2020 1052     Home Medications   Current Meds  Medication Sig   acetaminophen (TYLENOL) 325 MG tablet Take 650 mg by mouth every 6 (six) hours as needed for moderate pain.   aspirin EC 81 MG tablet Take 81 mg by mouth daily. Swallow whole.   atorvastatin (LIPITOR) 40 MG tablet TAKE 1 TABLET BY MOUTH EVERY DAY   azelastine (ASTELIN) 0.1 % nasal spray Place 1 spray into both nostrils 2 (two) times daily. Use in each nostril as directed   Calcium Carbonate Antacid (TUMS PO) Take 2 tablets by mouth as needed.   dicyclomine (BENTYL) 10 MG capsule Take 1  capsule (10 mg total) by mouth 3 (three) times daily as needed for spasms.   esomeprazole (NEXIUM) 40 MG capsule TAKE 1 CAPSULE BY MOUTH DAILY BEFORE BREAKFAST.   ezetimibe (ZETIA) 10 MG tablet Take 1 tablet (10 mg total) by mouth daily.   fexofenadine (ALLEGRA) 180 MG tablet Take 180 mg by mouth daily.   Lancets (ONETOUCH ULTRASOFT) lancets 1 each by Other route daily. Dx E11.9   lisinopril (ZESTRIL) 5 MG tablet Take 1 tablet (5 mg total) by mouth daily.   ONETOUCH ULTRA test strip CHECK BLOOD SUGAR DAILY   sildenafil (VIAGRA) 100 MG tablet TAKE 1 TABLET BY MOUTH DAILY AS NEEDED   SYNJARDY XR 25-1000 MG TB24 TAKE 1 TABLET EVERY DAY  tadalafil (CIALIS) 5 MG tablet Take 5 mg by mouth daily as needed for erectile dysfunction.    Testosterone 30 MG/ACT SOLN Place onto the skin daily.     Review of Systems      All other systems reviewed and are otherwise negative except as noted above.  Physical Exam    VS:  BP 136/84   Pulse 80   Ht 6' (1.829 m)   Wt 176 lb (79.8 kg)   BMI 23.87 kg/m  , BMI Body mass index is 23.87 kg/m.  Wt Readings from Last 3 Encounters:  12/24/21 176 lb (79.8 kg)  10/31/21 174 lb 2 oz (79 kg)  09/25/21 173 lb 4.8 oz (78.6 kg)     GEN: Well nourished, well developed, in no acute distress. HEENT: normal. Neck: Supple, no JVD, carotid bruits, or masses. Cardiac: RRR, no murmurs, rubs, or gallops. No clubbing, cyanosis, edema.  Radials/PT 2+ and equal bilaterally.  Respiratory:  Respirations regular and unlabored, clear to auscultation bilaterally. GI: Soft, nontender, nondistended. MS: No deformity or atrophy. Skin: Warm and dry, no rash. Neuro:  Strength and sensation are intact. Psych: Normal affect.  Assessment & Plan     CAD  - Cardiac CTA 05/2019 with diffuse nonobstructive 3 vessel plaque (50-69% prox LAD, 25-49% D1, RCA, PDA). Stable with no anginal symptoms. No indication for ischemic evaluation.  GDMT includes Aspirin, Atorvastatin, Zetia. Heart  healthy diet and regular cardiovascular exercise encouraged.    HLD, LDL goal less than 70- Due for lipid, PCP appointment later this week. Continue Atorvastatin, Zetia. If LDL not at goal of <70 consider increased dose Atorvastatin.   PVC - Asymptomatic with no palpitations. Not requiring AV nodal blocking therapy. Continue to avoid caffeine, stay well hydrated, manage stress well.   DM 2 - 09/2021 A1c 6.2. Continue to follow with PCP.   Disposition: Follow up in 6 month(s) with Buford Dresser, MD or APP.  Signed, Loel Dubonnet, NP 12/24/2021, 3:15 PM [facility]

## 2021-12-24 NOTE — Patient Instructions (Signed)
Medication Instructions:  Continue your current medications.   *If you need a refill on your cardiac medications before your next appointment, please call your pharmacy*   Lab Work: None ordered today.    Testing/Procedures: Your EKG today showed normal sinus rhythm which is a great result!   Follow-Up: At [facility], you and your health needs are our priority.  As part of our continuing mission to provide you with exceptional heart care, we have created designated Provider Care Teams.  These Care Teams include your primary Cardiologist (physician) and Advanced Practice Providers (APPs -  Physician Assistants and Nurse Practitioners) who all work together to provide you with the care you need, when you need it.  We recommend signing up for the patient portal called "MyChart".  Sign up information is provided on this After Visit Summary.  MyChart is used to connect with patients for Virtual Visits (Telemedicine).  Patients are able to view lab/test results, encounter notes, upcoming appointments, etc.  Non-urgent messages can be sent to your provider as well.   To learn more about what you can do with MyChart, go to [email or web address].    Your next appointment:   6 month(s)  The format for your next appointment:   In Person  Provider:   Buford Dresser, MD    Other Instructions  Heart Healthy Diet Recommendations: A low-salt diet is recommended. Meats should be grilled, baked, or boiled. Avoid fried foods. Focus on lean protein sources like fish or chicken with vegetables and fruits. The American Heart Association is a Microbiologist!  American Heart Association Diet and Lifeystyle Recommendations   Exercise recommendations: The American Heart Association recommends 150 minutes of moderate intensity exercise weekly. Try 30 minutes of moderate intensity exercise 4-5 times per week. This could include walking, jogging, or swimming.   Important Information About  Sugar

## 2021-12-26 ENCOUNTER — Ambulatory Visit (INDEPENDENT_AMBULATORY_CARE_PROVIDER_SITE_OTHER): Payer: Medicare HMO | Admitting: Internal Medicine

## 2021-12-26 VITALS — BP 138/76 | HR 82 | Temp 98.1°F | Ht 71.0 in | Wt 171.9 lb

## 2021-12-26 DIAGNOSIS — E538 Deficiency of other specified B group vitamins: Secondary | ICD-10-CM

## 2021-12-26 DIAGNOSIS — E119 Type 2 diabetes mellitus without complications: Secondary | ICD-10-CM | POA: Diagnosis not present

## 2021-12-26 DIAGNOSIS — E7800 Pure hypercholesterolemia, unspecified: Secondary | ICD-10-CM

## 2021-12-26 DIAGNOSIS — I2510 Atherosclerotic heart disease of native coronary artery without angina pectoris: Secondary | ICD-10-CM | POA: Diagnosis not present

## 2021-12-26 DIAGNOSIS — K219 Gastro-esophageal reflux disease without esophagitis: Secondary | ICD-10-CM | POA: Diagnosis not present

## 2021-12-26 DIAGNOSIS — Z0000 Encounter for general adult medical examination without abnormal findings: Secondary | ICD-10-CM

## 2021-12-26 DIAGNOSIS — C61 Malignant neoplasm of prostate: Secondary | ICD-10-CM

## 2021-12-26 LAB — LIPID PANEL
Cholesterol: 124 mg/dL (ref 0–200)
HDL: 59.4 mg/dL (ref >39.00)
LDL Cholesterol: 56 mg/dL (ref 0–99)
NonHDL: 64.79
Total CHOL/HDL Ratio: 2
Triglycerides: 46 mg/dL (ref 0.0–149.0)
VLDL: 9.2 mg/dL (ref 0.0–40.0)

## 2021-12-26 LAB — CBC WITH DIFFERENTIAL/PLATELET
Basophils Absolute: 0 10*3/uL (ref 0.0–0.1)
Basophils Relative: 0.8 % (ref 0.0–3.0)
Eosinophils Absolute: 0.1 10*3/uL (ref 0.0–0.7)
Eosinophils Relative: 2.4 % (ref 0.0–5.0)
HCT: 44.2 % (ref 39.0–52.0)
Hemoglobin: 15 g/dL (ref 13.0–17.0)
Lymphocytes Relative: 27.5 % (ref 12.0–46.0)
Lymphs Abs: 1.2 10*3/uL (ref 0.7–4.0)
MCHC: 33.9 g/dL (ref 30.0–36.0)
MCV: 92.2 fl (ref 78.0–100.0)
Monocytes Absolute: 0.4 10*3/uL (ref 0.1–1.0)
Monocytes Relative: 8.1 % (ref 3.0–12.0)
Neutro Abs: 2.7 10*3/uL (ref 1.4–7.7)
Neutrophils Relative %: 61.2 % (ref 43.0–77.0)
Platelets: 164 10*3/uL (ref 150.0–400.0)
RBC: 4.8 Mil/uL (ref 4.22–5.81)
RDW: 13.2 % (ref 11.5–15.5)
WBC: 4.3 10*3/uL (ref 4.0–10.5)

## 2021-12-26 LAB — COMPREHENSIVE METABOLIC PANEL
ALT: 18 U/L (ref 0–53)
AST: 17 U/L (ref 0–37)
Albumin: 4.5 g/dL (ref 3.5–5.2)
Alkaline Phosphatase: 97 U/L (ref 39–117)
BUN: 15 mg/dL (ref 6–23)
CO2: 25 mEq/L (ref 19–32)
Calcium: 9.7 mg/dL (ref 8.4–10.5)
Chloride: 104 mEq/L (ref 96–112)
Creatinine, Ser: 0.62 mg/dL (ref 0.40–1.50)
GFR: 99.66 mL/min (ref >60.00)
Glucose, Bld: 120 mg/dL — ABNORMAL HIGH (ref 70–99)
Potassium: 4.1 mEq/L (ref 3.5–5.1)
Sodium: 139 mEq/L (ref 135–145)
Total Bilirubin: 0.7 mg/dL (ref 0.2–1.2)
Total Protein: 6.8 g/dL (ref 6.0–8.3)

## 2021-12-26 LAB — MICROALBUMIN / CREATININE URINE RATIO
Creatinine,U: 46.6 mg/dL
Microalb Creat Ratio: 1.5 mg/g (ref 0.0–30.0)
Microalb, Ur: <0.7 mg/dL (ref 0.0–1.9)

## 2021-12-26 LAB — HEMOGLOBIN A1C: Hgb A1c MFr Bld: 6.2 % (ref 4.6–6.5)

## 2021-12-26 LAB — VITAMIN B12: Vitamin B-12: 613 pg/mL (ref 211–911)

## 2021-12-26 NOTE — Progress Notes (Signed)
Established Patient Office Visit     CC/Reason for Visit: Annual preventive exam and subsequent Medicare wellness visit  HPI: Jeff Jenkins is a 66 y.o. male who is coming in today for the above mentioned reasons. Past Medical History is significant for:  Type 2 diabetes, hyperlipidemia, GERD, coronary artery disease, vitamin B12 deficiency.  He had inguinal hernia surgery repair since I last saw him.  He is otherwise doing well.  He has routine eye and dental care.  He is now wearing hearing aids bilaterally.  All immunizations are up-to-date.  He is adherent to medical therapy.  He had a colonoscopy in 2018, he had his diabetic eye exam in May.   Past Medical/Surgical History: Past Medical History:  Diagnosis Date   Allergy    Arthritis    Broken ankle 2008   Coronary artery disease    Diverticulosis    ED (erectile dysfunction)    Facial fracture ([place]) 1979   GERD (gastroesophageal reflux disease)    Hearing loss in right ear    Hemorrhoids    Hiatal hernia    Hyperlipidemia    Hypertension    Peptic stricture of esophagus    Peptic ulcer disease 1989   Prostate cancer ([place]) 02/2009   Seasonal allergies    Type 2 diabetes mellitus ([place])     Past Surgical History:  Procedure Laterality Date   ANKLE SURGERY Left 2008   CHOLECYSTECTOMY N/A 01/31/2020   Procedure: LAPAROSCOPIC CHOLECYSTECTOMY WITH INTRAOPERATIVE CHOLANGIOGRAM;  Surgeon: Clovis Riley, MD;  Location: [facility];  Service: General;  Laterality: N/A;   FACIAL FRACTURE SURGERY     HERNIA REPAIR  09/26/2021   [place]ug[place] on 4 hernias in groin area  Dr [facility]   INGUINAL HERNIA REPAIR Left    KNEE ARTHROSCOPY Left 2013   KNEE ARTHROSCOPY  07/03/2020   [facility]   PROSTATECTOMY  2011   TONSILLECTOMY     UPPER GASTROINTESTINAL ENDOSCOPY     VASECTOMY      Social History:  reports that he has never smoked. He has never used smokeless tobacco. He reports current alcohol use of about  12.0 standard drinks of alcohol per week. He reports that he does not use drugs.  Allergies: No Known Allergies  Family History:  Family History  Problem Relation Age of Onset   Breast cancer Mother    Diabetes Mother    Heart attack Mother    Alcohol abuse Father    Stroke Brother    Colon cancer Neg Hx    Esophageal cancer Neg Hx    Rectal cancer Neg Hx    Stomach cancer Neg Hx      Current Outpatient Medications:    acetaminophen (TYLENOL) 325 MG tablet, Take 650 mg by mouth every 6 (six) hours as needed for moderate pain., Disp: , Rfl:    aspirin EC 81 MG tablet, Take 81 mg by mouth daily. Swallow whole., Disp: , Rfl:    atorvastatin (LIPITOR) 40 MG tablet, TAKE 1 TABLET BY MOUTH EVERY DAY, Disp: 90 tablet, Rfl: 0   azelastine (ASTELIN) 0.1 % nasal spray, Place 1 spray into both nostrils 2 (two) times daily. Use in each nostril as directed, Disp: 30 mL, Rfl: 2   Calcium Carbonate Antacid (TUMS PO), Take 2 tablets by mouth as needed., Disp: , Rfl:    diclofenac (VOLTAREN) 75 MG EC tablet, Take 75 mg by mouth 2 (two) times daily., Disp: , Rfl:  dicyclomine (BENTYL) 10 MG capsule, Take 1 capsule (10 mg total) by mouth 3 (three) times daily as needed for spasms., Disp: 270 capsule, Rfl: 3   esomeprazole (NEXIUM) 40 MG capsule, TAKE 1 CAPSULE BY MOUTH DAILY BEFORE BREAKFAST., Disp: 90 capsule, Rfl: 3   ezetimibe (ZETIA) 10 MG tablet, Take 1 tablet (10 mg total) by mouth daily., Disp: 90 tablet, Rfl: 1   fexofenadine (ALLEGRA) 180 MG tablet, Take 180 mg by mouth daily., Disp: , Rfl:    Lancets (ONETOUCH ULTRASOFT) lancets, 1 each by Other route daily. Dx E11.9, Disp: 100 each, Rfl: 3   lisinopril (ZESTRIL) 5 MG tablet, Take 1 tablet (5 mg total) by mouth daily., Disp: 90 tablet, Rfl: 1   ONETOUCH ULTRA test strip, CHECK BLOOD SUGAR DAILY, Disp: 100 strip, Rfl: 3   sildenafil (VIAGRA) 100 MG tablet, TAKE 1 TABLET BY MOUTH DAILY AS NEEDED, Disp: 10 tablet, Rfl: 11   SYNJARDY XR  25-1000 MG TB24, TAKE 1 TABLET EVERY DAY, Disp: 30 tablet, Rfl: 2   tadalafil (CIALIS) 5 MG tablet, Take 5 mg by mouth daily as needed for erectile dysfunction. , Disp: , Rfl:    Testosterone 30 MG/ACT SOLN, Place onto the skin daily., Disp: , Rfl:   Review of Systems:  Constitutional: Denies fever, chills, diaphoresis, appetite change and fatigue.  HEENT: Denies photophobia, eye pain, redness, hearing loss, ear pain, congestion, sore throat, rhinorrhea, sneezing, mouth sores, trouble swallowing, neck pain, neck stiffness and tinnitus.   Respiratory: Denies SOB, DOE, cough, chest tightness,  and wheezing.   Cardiovascular: Denies chest pain, palpitations and leg swelling.  Gastrointestinal: Denies nausea, vomiting, abdominal pain, diarrhea, constipation, blood in stool and abdominal distention.  Genitourinary: Denies dysuria, urgency, frequency, hematuria, flank pain and difficulty urinating.  Endocrine: Denies: hot or cold intolerance, sweats, changes in hair or nails, polyuria, polydipsia. Musculoskeletal: Denies myalgias, back pain, joint swelling, arthralgias and gait problem.  Skin: Denies pallor, rash and wound.  Neurological: Denies dizziness, seizures, syncope, weakness, light-headedness, numbness and headaches.  Hematological: Denies adenopathy. Easy bruising, personal or family bleeding history  Psychiatric/Behavioral: Denies suicidal ideation, mood changes, confusion, nervousness, sleep disturbance and agitation    Physical Exam: Vitals:   12/26/21 0837  BP: 138/76  Pulse: 82  Temp: 98.1 F (36.7 C)  TempSrc: Oral  SpO2: 96%  Weight: 171 lb 14.4 oz (78 kg)  Height: '5\' 11"'$  (1.803 m)    Body mass index is 23.98 kg/m.   Constitutional: NAD, calm, comfortable Eyes: PERRL, lids and conjunctivae normal ENMT: Mucous membranes are moist. Posterior pharynx is erythematous but clear of any exudate or lesions. Normal dentition. Tympanic membrane is pearly white, no erythema or  bulging. Neck: normal, supple, no masses, no thyromegaly Respiratory: clear to auscultation bilaterally, no wheezing, no crackles. Normal respiratory effort. No accessory muscle use.  Cardiovascular: Regular rate and rhythm, no murmurs / rubs / gallops. No extremity edema. 2+ pedal pulses. No carotid bruits.  Abdomen: no tenderness, no masses palpated. No hepatosplenomegaly. Bowel sounds positive.  Musculoskeletal: no clubbing / cyanosis. No joint deformity upper and lower extremities. Good ROM, no contractures. Normal muscle tone.  Skin: no rashes, lesions, ulcers. No induration Neurologic: CN 2-12 grossly intact. Sensation intact, DTR normal. Strength 5/5 in all 4.  Psychiatric: Normal judgment and insight. Alert and oriented x 3. Normal mood.    Subsequent Medicare wellness visit   1. Risk factors, based on past  M,S,F -cardiovascular disease risk factors include history of coronary artery  disease, diabetes, hyperlipidemia   2.  Physical activities: Remains quite physically active with landscaping, bowling   3.  Depression/mood: Stable, not depressed   4.  Hearing: Wears hearing aids   5.  ADL's: Independent in all ADLs   6.  Fall risk: Low fall risk   7.  Home safety: No problems identified   8.  Height weight, and visual acuity: height and weight as above, vision:  Vision Screening   Right eye Left eye Both eyes  Without correction     With correction '20/40 20/16 20/16 '$     9.  Counseling: Healthy lifestyle discussed   10. Lab orders based on risk factors: Laboratory update will be reviewed   11. Referral : None today   12. Care plan: Follow-up with me in 3 months   13. Cognitive assessment: No cognitive impairment   14. Screening: Patient provided with a written and personalized 5-10 year screening schedule in the AVS. yes   15. Provider List Update: PCP, cardiology, GI  16. Advance Directives: Full code   17. Opioids: Patient is not on any opioid  prescriptions and has no risk factors for a substance use disorder.   [place] Office Visit from 12/26/2021 in [facility] at [facility]  PHQ-9 Total Score 1          01/31/2020    7:48 AM 10/03/2020    9:53 AM 06/22/2021    9:21 AM 06/26/2021   10:43 AM 09/25/2021   11:45 AM  Fall Risk  Falls in the past year?  0 0 0 0  Was there an injury with Fall?  0   0  Fall Risk Category Calculator  0   0  Fall Risk Category  Low   Low  Patient Fall Risk Level Low fall risk Low fall risk  Low fall risk   Fall risk Follow up  Education provided   Falls evaluation completed     Impression and Plan:  Encounter for preventive health examination -Recommend routine eye and dental care. -Immunizations: All immunizations are up-to-date -Healthy lifestyle discussed in detail. -Labs to be updated today. -Colon cancer screening: 01/2017, 10-year follow-up -Breast cancer screening: Not applicable -Cervical cancer screening: Not applicable -Lung cancer screening: Never smoker, not applicable -Prostate cancer screening: Followed by urology, history of prostate cancer -DEXA: Not applicable  Prostate cancer ([place]) -PSA was undetectable in January, he follows with urology every 6 months.  Type 2 diabetes mellitus without obesity ([place])  - Plan: CBC with Differential/Platelet, Comprehensive metabolic panel, Hemoglobin A1c, Microalbumin / creatinine urine ratio -Check A1c today, has been previously well controlled with an A1c of 6.2  Pure hypercholesterolemia  - Plan: Lipid panel  Nonocclusive coronary atherosclerosis of native coronary artery -Followed by cardiology, check lipids  Gastroesophageal reflux disease, unspecified whether esophagitis present -Well-controlled, followed by GI  Vitamin B12 deficiency  - Plan: Vitamin B12, he is now on oral supplementation      Ameriah Lint Isaac Bliss, MD [facility]

## 2021-12-28 ENCOUNTER — Encounter: Admit: 2021-12-28 | Discharge: 2021-12-28 | Payer: MEDICARE

## 2021-12-31 ENCOUNTER — Encounter: Admit: 2021-12-31 | Discharge: 2021-12-31 | Payer: MEDICARE

## 2022-01-01 ENCOUNTER — Encounter: Admit: 2022-01-01 | Discharge: 2022-01-01 | Payer: MEDICARE

## 2022-01-07 ENCOUNTER — Encounter: Admit: 2022-01-07 | Discharge: 2022-01-07 | Payer: MEDICARE

## 2022-01-07 NOTE — Progress Notes
Records requested from [facility]  Fax  [phone] faxed today at 12;52 PM

## 2022-01-11 ENCOUNTER — Ambulatory Visit: Payer: Self-pay

## 2022-01-11 DIAGNOSIS — E7800 Pure hypercholesterolemia, unspecified: Secondary | ICD-10-CM

## 2022-01-11 DIAGNOSIS — E119 Type 2 diabetes mellitus without complications: Secondary | ICD-10-CM

## 2022-01-11 NOTE — Patient Instructions (Signed)
Visit Information Case closed goals met Thank you for allowing me to share the care management and care coordination services that are available to you as part of your health plan and services through your primary care provider and medical home. Please reach out to me at [phone] if the care management/care coordination team may be of assistance to you in the future.   Zavannah Deblois RN, BSN,CCM, CDE Care Management Coordinator [facility] [phone]   

## 2022-01-11 NOTE — Chronic Care Management (AMB) (Signed)
Chronic Care Management   CCM RN Visit Note  01/11/2022 Name: Jeff Jenkins MRN: [id number] DOB: 1955-12-29  Subjective: Chidubem Chaires is a 66 y.o. year old male who is a primary care patient of Isaac Bliss, Rayford Halsted, MD. The care management team was consulted for assistance with disease management and care coordination needs.    Engaged with patient by telephone for follow up visit in response to provider referral for case management and/or care coordination services.   Consent to Services:  The patient was given information about Chronic Care Management services, agreed to services, and gave verbal consent prior to initiation of services.  Please see initial visit note for detailed documentation.   Patient agreed to services and verbal consent obtained.   Assessment: Review of patient past medical history, allergies, medications, health status, including review of consultants reports, laboratory and other test data, was performed as part of comprehensive evaluation and provision of chronic care management services.   SDOH (Social Determinants of Health) assessments and interventions performed:    CCM Care Plan  No Known Allergies  Outpatient Encounter Medications as of 01/11/2022  Medication Sig   acetaminophen (TYLENOL) 325 MG tablet Take 650 mg by mouth every 6 (six) hours as needed for moderate pain.   aspirin EC 81 MG tablet Take 81 mg by mouth daily. Swallow whole.   atorvastatin (LIPITOR) 40 MG tablet TAKE 1 TABLET BY MOUTH EVERY DAY   azelastine (ASTELIN) 0.1 % nasal spray Place 1 spray into both nostrils 2 (two) times daily. Use in each nostril as directed   Calcium Carbonate Antacid (TUMS PO) Take 2 tablets by mouth as needed.   diclofenac (VOLTAREN) 75 MG EC tablet Take 75 mg by mouth 2 (two) times daily.   dicyclomine (BENTYL) 10 MG capsule Take 1 capsule (10 mg total) by mouth 3 (three) times daily as needed for spasms.   esomeprazole (NEXIUM) 40 MG  capsule TAKE 1 CAPSULE BY MOUTH DAILY BEFORE BREAKFAST.   ezetimibe (ZETIA) 10 MG tablet Take 1 tablet (10 mg total) by mouth daily.   fexofenadine (ALLEGRA) 180 MG tablet Take 180 mg by mouth daily.   Lancets (ONETOUCH ULTRASOFT) lancets 1 each by Other route daily. Dx E11.9   lisinopril (ZESTRIL) 5 MG tablet Take 1 tablet (5 mg total) by mouth daily.   ONETOUCH ULTRA test strip CHECK BLOOD SUGAR DAILY   sildenafil (VIAGRA) 100 MG tablet TAKE 1 TABLET BY MOUTH DAILY AS NEEDED   SYNJARDY XR 25-1000 MG TB24 TAKE 1 TABLET EVERY DAY   tadalafil (CIALIS) 5 MG tablet Take 5 mg by mouth daily as needed for erectile dysfunction.    Testosterone 30 MG/ACT SOLN Place onto the skin daily.   No facility-administered encounter medications on file as of 01/11/2022.    Patient Active Problem List   Diagnosis Date Noted   Chest pain of uncertain etiology 25/00/3704   Prostate cancer ([place]) 09/20/2020   Erectile dysfunction 09/15/2019   Nonocclusive coronary atherosclerosis of native coronary artery 05/20/2019   Coronary artery calcification seen on CT scan 05/20/2019   Type 2 diabetes mellitus without obesity ([place]) 03/25/2019   PVC's (premature ventricular contractions) 03/25/2019   Elevated blood pressure reading in office with white coat syndrome, without diagnosis of hypertension 03/25/2019   Pure hypercholesterolemia 03/25/2019   History of esophageal stricture 02/01/2015   Special screening for malignant neoplasms, colon 10/15/2010   HEMORRHOIDS-EXTERNAL 09/15/2008   HEMORRHOIDS 09/15/2008   GERD 09/15/2008    Conditions  to be addressed/monitored:HTN, HLD, and DMII  Care Plan : RN Care Manager Plan of Care  Updates made by Dimitri Ped, RN since 01/11/2022 12:00 AM  Completed 01/11/2022   Problem: Chronic Disease Management and Care Coordination Needs (DM,CAD,HTN and HLD) Resolved 01/11/2022  Priority: High     Long-Range Goal: Establish Plan of Care for Chronic Disease Management  Needs (DM,CAD,HTN and HLD) Completed 01/11/2022  Start Date: 06/26/2021  Expected End Date: 11/27/2022  Priority: High  Note:   Case closed goals met Current Barriers:  Chronic Disease Management support and education needs related to CAD, HTN, HLD, and DMII  States he had his hernias surgery and he is recovered now.  States he is back to being active and can do lifting now. States he is taking the [place] without any issues.   States his CBGs are ranging from 113-139.  States he has been bowling twice a week  and does yard work Electronics engineer for exercise. States he tries to eat healthy but will have a sweet desert about once a week. States his B/P has been good when he has checked and it was 122/79 this morning  Denies any chest pains, shortness of breath or swelling.     RNCM Clinical Goal(s):  Patient will verbalize understanding of plan for management of CAD, HTN, HLD, and DMII as evidenced by voiced adherence to plan of care verbalize basic understanding of  CAD, HTN, HLD, and DMII disease process and self health management plan as evidenced by voiced understanding and teach back take all medications exactly as prescribed and will call provider for medication related questions as evidenced by dispense report and pt verbalization attend all scheduled medical appointments: Dr. Jerilee Hoh 12/26/21, Cardiology 12/24/21 as evidenced by medical records demonstrate Improved adherence to prescribed treatment plan for CAD, HTN, HLD, and DMII as evidenced by readings within limits, voiced adherence to plan of care  through collaboration with RN Care manager, provider, and care team.   Interventions: 1:1 collaboration with primary care provider regarding development and update of comprehensive plan of care as evidenced by provider attestation and co-signature Inter-disciplinary care team collaboration (see longitudinal plan of care) Evaluation of current treatment plan related to  self management and  patient's adherence to plan as established by provider   CAD Interventions: (Status:  Goal Met.) Long Term Goal Assessed understanding of CAD diagnosis Medications reviewed including medications utilized in CAD treatment plan Provided education on importance of blood pressure control in management of CAD Provided education on Importance of limiting foods high in cholesterol Counseled on importance of regular laboratory monitoring as prescribed Counseled on the importance of exercise goals with target of 150 minutes per week Reviewed Importance of taking all medications as prescribed Reviewed Importance of attending all scheduled provider appointments Advised to report any changes in symptoms or exercise tolerance Reinforced to look into using his Silver Sneakers benefit to go to gym on bad weather days or walk at a big box store for exercise   Diabetes Interventions:  (Status:  Goal Met.) Long Term Goal Assessed patient's understanding of A1c goal: <6.5% Provided education to patient about basic DM disease process Reviewed medications with patient and discussed importance of medication adherence Counseled on importance of regular laboratory monitoring as prescribed Discussed plans with patient for ongoing care management follow up and provided patient with direct contact information for care management team Advised patient, providing education and rationale, to check cbg 1-2 times a week and record, calling provider  for findings outside established parameters Review of patient status, including review of consultants reports, relevant laboratory and other test results, and medications completed Reinforced to try to cut out sweet tea by starting by getting 1/2 sweet/un sweet or try using artifical sweetener instead. Reinforced importance of regular exercise to help with blood sugar control Lab Results  Component Value Date   HGBA1C 6.2 (A) 09/25/2021   Hyperlipidemia Interventions:   (Status:  Goal Met.) Long Term Goal Medication review performed; medication list updated in electronic medical record.  Provider established cholesterol goals reviewed Counseled on importance of regular laboratory monitoring as prescribed Reviewed role and benefits of statin for ASCVD risk reduction Reviewed importance of limiting foods high in cholesterol Reviewed exercise goals and target of 150 minutes per week  Hypertension Interventions:  (Status:  Goal Met.) Long Term Goal Last practice recorded BP readings:  BP Readings from Last 3 Encounters:  06/26/21 130/80  06/22/21 122/82  03/27/21 140/80  Most recent eGFR/CrCl: No results found for: EGFR  No components found for: CRCL  Evaluation of current treatment plan related to hypertension self management and patient's adherence to plan as established by provider Provided education to patient re: stroke prevention, s/s of heart attack and stroke Counseled on the importance of exercise goals with target of 150 minutes per week Discussed plans with patient for ongoing care management follow up and provided patient with direct contact information for care management team Advised patient, providing education and rationale, to monitor blood pressure daily and record, calling PCP for findings outside established parameters Discussed complications of poorly controlled blood pressure such as heart disease, stroke, circulatory complications, vision complications, kidney impairment, sexual dysfunction Reviewed to take his log of B/P readings with him to his next provider visit  Patient Goals/Self-Care Activities: Take all medications as prescribed Attend all scheduled provider appointments Call pharmacy for medication refills 3-7 days in advance of running out of medications Perform all self care activities independently  Perform IADL's (shopping, preparing meals, housekeeping, managing finances) independently Call provider office for new  concerns or questions  keep appointment with eye doctor check blood sugar at prescribed times: when you have symptoms of low or high blood sugar and 1-2 times a week check feet daily for cuts, sores or redness take the blood sugar log to all doctor visits drink 6 to 8 glasses of water each day fill half of plate with vegetables manage portion size prepare main meal at home 3 to 5 days each week switch to sugar-free drinks wash and dry feet carefully every day Look into Silver Sneakers benefit check blood pressure weekly choose a place to take my blood pressure (home, clinic or office, retail store) take blood pressure log to all doctor appointments keep all doctor appointments eat more whole grains, fruits and vegetables, lean meats and healthy fats limit salt intake to 2340m/day call for medicine refill 2 or 3 days before it runs out take all medications exactly as prescribed call doctor with any symptoms you believe are related to your medicine  Follow Up Plan:  The patient has been provided with contact information for the care management team and has been advised to call with any health related questions or concerns.  No further follow up required: Case closed goals met       Plan:The patient has been provided with contact information for the care management team and has been advised to call with any health related questions or concerns.  No further follow up  required: Case closed goals met Peter Garter RN, [facility], CDE Care Management Coordinator [place] [phone]

## 2022-01-16 ENCOUNTER — Other Ambulatory Visit: Payer: Self-pay | Admitting: Internal Medicine

## 2022-01-16 DIAGNOSIS — E119 Type 2 diabetes mellitus without complications: Secondary | ICD-10-CM

## 2022-01-17 ENCOUNTER — Encounter: Admit: 2022-01-17 | Discharge: 2022-01-17 | Payer: MEDICARE

## 2022-01-17 NOTE — Progress Notes
To our valued patient,     We have enrolled your heart monitor and requested it be sent to your home.  You should receive this within 2-3 business days. Please wear the monitor for 14 days. When you have completed the study, please remove the device, and mail it back to the company. Please call [employer] Customer Service at [phone] with questions about placement, troubleshooting, and insurance coverage. You can reach the [facility] ambulatory heart monitor team at [phone].      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)  Ordering Provider: Michaelene Song  AMB Monitor Serial Number: Home  No data recorded    Start Time and Date: 01/17/22 4:32 PM   Patient Name: Tommy Miller  DOB: 08-19-1955 09/22/55  MRN: [id number]  Sex: male  Mobile Phone Number: [phone] (mobile)  Home Phone Number: [phone]  Patient Address: [address] [place] [address]  Insurance Coverage: MEDICARE PART A AND B  Insurance ID: [id number]  Insurance Group #:   Insurance Subscriber: [name],[name]  Implanted Cardiac Device Information: No results found for: [id number]      Patient instructed to contact company phone number on the monitor box with questions regarding billing, placement, troubleshooting.     [name]    ____________________________________________________________    Clinic Staff:    ? Complete additional steps for documentation double check/Co-Sign.  ? In Follow-up, send chart upon closing encounter to P [facility] HRM AMBULATORY MONITORS    HRM Ambulatory Monitoring Team:  1. Schedule on appropriate template and check-in.   Clinic Placement Schedule on clinic location [facility] schedule   Home Enrollment Schedule on Home Enrollment schedule ([facility])   Given to patient in clinic for self-placement Schedule on Home Enrollment schedule ([facility])   Inpatient Schedule on [place] [facility] AMBULATORY MONITORING template   2. Please enroll with appropriate vendor.

## 2022-01-21 ENCOUNTER — Ambulatory Visit (HOSPITAL_BASED_OUTPATIENT_CLINIC_OR_DEPARTMENT_OTHER): Payer: Medicare HMO | Admitting: Cardiology

## 2022-01-28 ENCOUNTER — Other Ambulatory Visit: Payer: Self-pay | Admitting: Gastroenterology

## 2022-01-30 DIAGNOSIS — E230 Hypopituitarism: Secondary | ICD-10-CM | POA: Diagnosis not present

## 2022-01-31 ENCOUNTER — Encounter: Admit: 2022-01-31 | Discharge: 2022-01-31 | Payer: MEDICARE

## 2022-02-01 ENCOUNTER — Encounter: Admit: 2022-02-01 | Discharge: 2022-02-01 | Payer: MEDICARE

## 2022-02-06 DIAGNOSIS — N393 Stress incontinence (female) (male): Secondary | ICD-10-CM | POA: Diagnosis not present

## 2022-02-06 DIAGNOSIS — N5201 Erectile dysfunction due to arterial insufficiency: Secondary | ICD-10-CM | POA: Diagnosis not present

## 2022-02-09 ENCOUNTER — Encounter: Admit: 2022-02-09 | Discharge: 2022-02-09 | Payer: MEDICARE

## 2022-02-10 NOTE — Progress Notes
Request for the following medical records for purpose of continuity of care:   Has an appointment with RRR on 02/15/2022    Please send TEE results.    Please Fax to: [phone]  Cardiology Services at the [facility]   Dr. Derrell Lolling  Attention:  Florentina Addison, RN    Thank you

## 2022-02-15 ENCOUNTER — Encounter: Admit: 2022-02-15 | Discharge: 2022-02-15 | Payer: MEDICARE

## 2022-02-18 ENCOUNTER — Encounter: Admit: 2022-02-18 | Discharge: 2022-02-18 | Payer: MEDICARE

## 2022-02-18 DIAGNOSIS — I480 Paroxysmal atrial fibrillation: Secondary | ICD-10-CM

## 2022-02-18 NOTE — Telephone Encounter
-----   Message from Winfred Burn, RN sent at 02/15/2022  2:51 PM CDT -----  Rob Bunting,       RRR saw this patient in clinic today.  He had a combo watchman and afib ablation on 5/1.    He was never scheduled for a follow up TEE.    Can you coordinate that?     RRR wants it done as soon as possible.   Thank you!    Joni, RN

## 2022-02-18 NOTE — Telephone Encounter
Patient wishes to have TEE done at [facility] in [facility].  Will place orders as follows:    03/13/2022 at 09:30 am     (arrival time) TEE (transesophageal echocardiogram)  Location:  [place] main campus, [address], [place]    Nothing to eat or drink after midnight the night before.   You will receive a phone call 1-2 business days in advance with instructions for your medications.  You must have a driver home.      Patient verbalized understanding of date/times/location and instructions.

## 2022-02-25 ENCOUNTER — Encounter: Admit: 2022-02-25 | Discharge: 2022-02-25 | Payer: MEDICARE

## 2022-02-26 ENCOUNTER — Telehealth: Payer: Medicare HMO

## 2022-02-26 ENCOUNTER — Encounter: Payer: Self-pay | Admitting: Internal Medicine

## 2022-02-26 ENCOUNTER — Encounter: Admit: 2022-02-26 | Discharge: 2022-02-26 | Payer: MEDICARE

## 2022-03-04 ENCOUNTER — Other Ambulatory Visit: Payer: Self-pay | Admitting: *Deleted

## 2022-03-04 ENCOUNTER — Encounter: Admit: 2022-03-04 | Discharge: 2022-03-04 | Payer: MEDICARE

## 2022-03-04 MED ORDER — EMPAGLIFLOZIN 25 MG PO TABS: 25.0000 mg | ORAL_TABLET | Freq: Every day | ORAL | 0 refills | Status: DC

## 2022-03-04 NOTE — Progress Notes
Report Sheet    TEE         Indication: Post Watchman, 45 days (11/12/21)  Ordering Provider: Micheline Rough, MD    Name: Tommy Miller     Age: 66 y.o.    DOB: [birth year]     MRN: [id number]  Patient phone number: [phone]    Communication Barriers: none noted    Lab(s) needed: No open orders; None  Other:     Device check needed: No  Device: No results found for: GENERATOR, EPDEVTYP    Pt Called:   Arrival Time: 03/13/22 at 9:30am  Driver Information:     Date of Last H&P: 02/15/22 Derrell Lolling, MD   Additional Information:     Prior TEE: 11/12/21     Prior DCCV:      Prior Echo:11/14/21       Previous TEE/DCCV details:     EF:   ECHO EF   Date Value Ref Range Status   11/14/2021 60 % Final       Anticoag:Eliquis     Dose:5mg      Frequency:BID     Missed:  Labs   INR    HGB 14.0 (11/14/21)   Platelets 143   Glucose 105   NA 140   K 4.1   Creatinine 0.83   Other      Medications to HOLD day of:  ? Vitamins/Supplements  ? Trulicity  ? Metformin      Probe   Gastric Surgery    GERD    Swallow difficulty    Head/Neck Surg/Rad    Chest Surg/Rad Ablation; Watchman   EGD 04/11/21 Normal esoph, normal stomach   Varices/Esophag CA    GI bleed Hemorrhoids, bleeding   Dental issues      Sedation   COPD    OSA/CPAP    Asthma    Pulm HTN    Anesthesia Issues    Smoker Never   ETOH & Frequency    Drug Use    Chronic Pain Med      Current Medications:   ? amLODIPine (NORVASC) 10 mg tablet Take one tablet by mouth daily. (Patient taking differently: Take one tablet by mouth daily as needed (elevated BP).)   ? apixaban (ELIQUIS) 5 mg tablet Take one tablet by mouth twice daily.   ? ascorbic acid (vitamin C) 500 mg tablet Take one tablet by mouth daily.   ? aspirin EC 81 mg tablet Take one tablet by mouth daily. Take with food.   ? cetirizine (ZYRTEC) 10 mg tablet Take one tablet by mouth at bedtime daily.   ? CHOLEcalciferoL (vitamin D3) (VITAMIN D3) 1,000 units tablet Take 1 capsule in the morning and 2 capsules at bedtime   ? dulaglutide (TRULICITY) 1.5 mg/0.5 mL injection pen Inject 0.5 mL under the skin every 7 days.   ? fish oil /omega-3 fatty acids (SEA-OMEGA) 340/1000 mg capsule Take two capsules by mouth twice daily. 1400 mg each capsule   ? fluticasone propionate (FLONASE) 50 mcg/actuation nasal spray, suspension Apply two sprays to each nostril as directed daily. Shake bottle gently before using. (Patient taking differently: Apply two sprays to each nostril as directed at bedtime daily. Shake bottle gently before using.)   ? losartan (COZAAR) 50 mg tablet Take one tablet by mouth daily.   ? magnesium oxide (MAGOX) 400 mg (241.3 mg magnesium) tablet Take one tablet by mouth daily.   ? metFORMIN (GLUCOPHAGE) 500 mg tablet Take one tablet by mouth twice daily  after meals. Indications: type 2 diabetes mellitus   ? metoprolol succinate XL (TOPROL XL) 100 mg extended release tablet Take one tablet by mouth twice daily.   ? metroNIDAZOLE (METROGEL) 1 % topical gel Apply one g topically to affected area twice daily.   ? montelukast (SINGULAIR) 10 mg tablet Take one tablet by mouth at bedtime daily. PRN   ? omeprazole DR (PRILOSEC) 20 mg capsule TAKE 1 CAPSULE BY MOUTH DAILY BEFORE BREAKFAST   ? phenylephrine-cocoa butter (PREPARATION H(PE,CB)) 0.25-88.44 % rectal suppository Insert or Apply one suppository to rectal area as directed every 12 hours. Indications: hemorrhoids   ? rosuvastatin (CRESTOR) 20 mg tablet TAKE ONE TABLET BY MOUTH DAILY   ? vitamin E 400 unit capsule Take one capsule by mouth daily.       Other Pertinent HX:  Past Medical/Surgical History:   Patient Active Problem List    Diagnosis Date Noted   ? Venous stasis dermatitis of both lower extremities 02/26/2022   ? H/O radiofrequency ablation for complex left atrial arrhythmia 12/28/2021   ? Presence of Watchman left atrial appendage closure device 11/12/2021   ? Paroxysmal atrial fibrillation (HCC) 11/12/2021   ? Type 2 diabetes mellitus without complication, without long-term current use of insulin (HCC) 08/30/2021   ? Non-alcoholic fatty liver disease 08/29/2021   ? Subclinical hypothyroidism 08/29/2021   ? Polycythemia 08/29/2021   ? BRBPR (bright red blood per rectum) 08/20/2021   ? Obesity (BMI 30-39.9) 06/15/2021   ? RBBB 06/15/2021   ? Erectile dysfunction due to arterial insufficiency 05/01/2021   ? Encounter for Medicare annual wellness exam 05/01/2021   ? Hemorrhoids, internal, with bleeding    ? HTN (hypertension) 12/26/2020   ? OSA (obstructive sleep apnea) 12/26/2020   ? Mixed hyperlipidemia 12/15/2020   ? Acne rosacea 12/15/2020      Past Medical History:   Diagnosis Date   ? A-fib (HCC)    ? GERD (gastroesophageal reflux disease)    ? HTN (hypertension) 12/26/2020   ? Obstructive sleep apnea hypopnea, severe    ? OSA (obstructive sleep apnea) 12/26/2020   ? Palpitations 12/26/2020   ? PONV (postoperative nausea and vomiting)     during EGD   ? Right shoulder injury       Surgical History:   Procedure Laterality Date   ? COLONOSCOPY  04/11/2021    [name] no recall date   ? INTRACARDIAC CATHETER ABLATION WITH COMPREHENSIVE ELECTROPHYSIOLOGIC EVALUATION - ATRIAL FIBRILLATION N/A 11/12/2021    Performed by Lorain Childes, MD at [facility] EP LAB   ? PERCUTANEOUS CLOSURE LEFT ATRIAL APPENDAGE WITH ENDOCARDIAL IMPLANT, TRANSEPTAL CATHETERIZATION, FOLEY, CONTRAST ANTICIPATED N/A 11/12/2021    Performed by Reola Mosher, MD at [facility] EP LAB   ? TRANSESOPHAGEAL ECHOCARDIOGRAM DURING INTERVENTION N/A 11/12/2021    Performed by Vena Austria, MD at [facility] EP LAB   ? CARDIOVASCULAR STRESS TEST     ? ELECTROCARDIOGRAM     ? EVENT MONITOR     ? HX TONSILLECTOMY      as a chld   ? SHOULDER SURGERY Right     Shoulder repair by Dr. Glenetta Hew.        Allergies: No Known Allergies  Sharyon Medicus, RN

## 2022-03-05 ENCOUNTER — Encounter: Admit: 2022-03-05 | Discharge: 2022-03-05 | Payer: MEDICARE

## 2022-03-06 DIAGNOSIS — M47812 Spondylosis without myelopathy or radiculopathy, cervical region: Secondary | ICD-10-CM | POA: Diagnosis not present

## 2022-03-06 DIAGNOSIS — M542 Cervicalgia: Secondary | ICD-10-CM | POA: Diagnosis not present

## 2022-03-13 ENCOUNTER — Encounter: Admit: 2022-03-13 | Discharge: 2022-03-13 | Payer: MEDICARE

## 2022-03-13 ENCOUNTER — Ambulatory Visit: Admit: 2022-03-13 | Discharge: 2022-03-13 | Payer: MEDICARE

## 2022-03-13 DIAGNOSIS — I480 Paroxysmal atrial fibrillation: Secondary | ICD-10-CM

## 2022-03-13 MED ORDER — LIDOCAINE 5 % TP OINT
Freq: Once | TOPICAL | 0 refills | Status: CP
Start: 2022-03-13 — End: ?
  Administered 2022-03-13: 16:00:00 1 via TOPICAL

## 2022-03-13 MED ORDER — FAMOTIDINE (PF) 20 MG/2 ML IV SOLN
INTRAVENOUS | 0 refills | Status: DC
Start: 2022-03-13 — End: 2022-03-13
  Administered 2022-03-13: 16:00:00 20 mg via INTRAVENOUS

## 2022-03-13 MED ORDER — SODIUM CHLORIDE 0.9 % IV SOLP
500 mL | Freq: Once | INTRAVENOUS | 0 refills | Status: CP
Start: 2022-03-13 — End: ?
  Administered 2022-03-13: 16:00:00 500.000 mL via INTRAVENOUS

## 2022-03-13 MED ORDER — PROPOFOL 10 MG/ML IV EMUL 20 ML (INFUSION)(AM)(OR)
INTRAVENOUS | 0 refills | Status: DC
Start: 2022-03-13 — End: 2022-03-13
  Administered 2022-03-13: 16:00:00 125 ug/kg/min via INTRAVENOUS

## 2022-03-13 NOTE — Patient Instructions
CARDIOLOGY PROCEDURES           POST SEDATION INSTRUCTIONS      Patient Name: Tommy Miller  MRN#: [id number]  Date: 03/13/2022      Please follow the instructions listed below:     Assess gag reflex at     Avoid food and beverages (especially hot liquids) until sensation has returned to your throat.     If gag reflex has not returned; wait 20-30 minutes, check reflex again.      Resume diet as prescribed when gag reflex is present.  This typically occurs within one hour.    The following day you may experience a minor sore throat.     Please have someone accompany you, as YOU SHOULD NOT drive or operate machinery for at least 12-24 hours following the procedure.    There may be some residual effects from the sedatives during the procedure.  Do not drive a vehicle for up to 24 hours after receiving sedation.  Do not operate heavy or potentially harmful equipment  Do not make legally binding decisions  Do not drink alcohol for up to 24 hours  Do not communicate through social media for 24 hours.     Other instructions: Please resume normal diet and medications    If you have question or concerns about this procedure, please contact the Cardiology office at [phone], and ask to speak to one of the nurses.      Current Medications List:   amLODIPine (NORVASC) 10 mg tablet Take one tablet by mouth daily. (Patient taking differently: Take one tablet by mouth daily as needed (elevated BP).)    apixaban (ELIQUIS) 5 mg tablet Take one tablet by mouth twice daily.    ascorbic acid (vitamin C) 500 mg tablet Take one tablet by mouth daily.    aspirin EC 81 mg tablet Take one tablet by mouth daily. Take with food.    cetirizine (ZYRTEC) 10 mg tablet Take one tablet by mouth at bedtime daily.    CHOLEcalciferoL (vitamin D3) (VITAMIN D3) 1,000 units tablet Take 1 capsule in the morning and 2 capsules at bedtime    dulaglutide (TRULICITY) 1.5 mg/0.5 mL injection pen Inject 0.5 mL under the skin every 7 days.    fish oil /omega-3 fatty acids (SEA-OMEGA) 340/1000 mg capsule Take two capsules by mouth twice daily. 1400 mg each capsule    fluticasone propionate (FLONASE) 50 mcg/actuation nasal spray, suspension Apply two sprays to each nostril as directed daily. Shake bottle gently before using. (Patient taking differently: Apply two sprays to each nostril as directed at bedtime daily. Shake bottle gently before using.)    losartan (COZAAR) 50 mg tablet Take one tablet by mouth daily.    magnesium oxide (MAGOX) 400 mg (241.3 mg magnesium) tablet Take one tablet by mouth daily.    metFORMIN (GLUCOPHAGE) 500 mg tablet Take one tablet by mouth twice daily after meals. Indications: type 2 diabetes mellitus    metoprolol succinate XL (TOPROL XL) 100 mg extended release tablet Take one tablet by mouth twice daily.    metroNIDAZOLE (METROGEL) 1 % topical gel Apply one g topically to affected area twice daily.    montelukast (SINGULAIR) 10 mg tablet Take one tablet by mouth at bedtime daily. PRN    omeprazole DR (PRILOSEC) 20 mg capsule TAKE 1 CAPSULE BY MOUTH DAILY BEFORE BREAKFAST    phenylephrine-cocoa butter (PREPARATION H(PE,CB)) 0.25-88.44 % rectal suppository Insert or Apply one suppository to rectal area  as directed every 12 hours. Indications: hemorrhoids    rosuvastatin (CRESTOR) 20 mg tablet TAKE ONE TABLET BY MOUTH DAILY    vitamin E 400 unit capsule Take one capsule by mouth daily.         Instructions Given To: Eduardo Osier    Instructions Given By: Bobette Mo, RN

## 2022-03-13 NOTE — Anesthesia Post-Procedure Evaluation
Post-Anesthesia Evaluation    Name: Tommy Miller      MRN: [id number]     DOB: [birth year]     Age: 66 y.o.     Sex: male   __________________________________________________________________________     Procedure Information     Anesthesia Start Date/Time: 03/13/22 1034    Scheduled providers: Randall Hiss, MD    Procedure: TRANSESOPHAGEAL ECHO    Location: Cardiology:[facility]          Post-Anesthesia Vitals    No vitals data found for the desired time range.        Post Anesthesia Evaluation Note    Evaluation location: Pre/Post  Patient participation: recovered; patient participated in evaluation  Level of consciousness: alert    Pain score: 0  Pain management: adequate    Hydration: normovolemia  Temperature: 36.0C - 38.4C  Airway patency: adequate    Perioperative Events       Post-op nausea and vomiting: no PONV    Postoperative Status  Cardiovascular status: hemodynamically stable  Respiratory status: spontaneous ventilation  Follow-up needed: none        Perioperative Events  There were no known notable events for this encounter.

## 2022-03-13 NOTE — Progress Notes
Pre-Operative Assessment for TEE or Cardioversion    Date of Service:  03/13/2022    Tommy Miller is a 66 y.o. y.o. male. With significant Afib, HTN, GERD, who is referred for TEE Indication: Watchman 45 day post .      he has been compliant with his  apixaban (Eliquis) Missed dose: Noand ... bleeding. he is Positive for: GERD and Hx. GI Bleeding and Positive for: Sleep Apnea/OSA and CPAP.      GI procedures:EGD and Colonoscopy            When: When/Date: 2022    Chest pain:  No   SOB: No         Medical History:  Medical History:   Diagnosis Date   ? A-fib (HCC)    ? GERD (gastroesophageal reflux disease)    ? HTN (hypertension) 12/26/2020   ? Obstructive sleep apnea hypopnea, severe    ? OSA (obstructive sleep apnea) 12/26/2020   ? Palpitations 12/26/2020   ? PONV (postoperative nausea and vomiting)     during EGD   ? Right shoulder injury         Surgical History:   Surgical History:   Procedure Laterality Date   ? COLONOSCOPY  04/11/2021    [name] no recall date   ? INTRACARDIAC CATHETER ABLATION WITH COMPREHENSIVE ELECTROPHYSIOLOGIC EVALUATION - ATRIAL FIBRILLATION N/A 11/12/2021    Performed by Lorain Childes, MD at [facility] EP LAB   ? PERCUTANEOUS CLOSURE LEFT ATRIAL APPENDAGE WITH ENDOCARDIAL IMPLANT, TRANSEPTAL CATHETERIZATION, FOLEY, CONTRAST ANTICIPATED N/A 11/12/2021    Performed by Reola Mosher, MD at [facility] EP LAB   ? TRANSESOPHAGEAL ECHOCARDIOGRAM DURING INTERVENTION N/A 11/12/2021    Performed by Vena Austria, MD at [facility] EP LAB   ? CARDIOVASCULAR STRESS TEST     ? ELECTROCARDIOGRAM     ? EVENT MONITOR     ? HX TONSILLECTOMY      as a chld   ? SHOULDER SURGERY Right     Shoulder repair by Dr. Glenetta Hew.        Social History     Social History     Tobacco Use   ? Smoking status: Never   ? Smokeless tobacco: Never   Vaping Use   ? Vaping Use: Never used   Substance Use Topics   ? Alcohol use: Yes     Comment: 1 a month   ? Drug use: Never         Allergies No Known Allergies       Current Medications  Current Outpatient Medications on File Prior to Encounter   Medication Sig Dispense Refill   ? amLODIPine (NORVASC) 10 mg tablet Take one tablet by mouth daily. (Patient taking differently: Take one tablet by mouth daily as needed (elevated BP).) 90 tablet 1   ? apixaban (ELIQUIS) 5 mg tablet Take one tablet by mouth twice daily. 180 tablet 3   ? ascorbic acid (vitamin C) 500 mg tablet Take one tablet by mouth daily.     ? aspirin EC 81 mg tablet Take one tablet by mouth daily. Take with food. 90 tablet 11   ? cetirizine (ZYRTEC) 10 mg tablet Take one tablet by mouth at bedtime daily.     ? CHOLEcalciferoL (vitamin D3) (VITAMIN D3) 1,000 units tablet Take 1 capsule in the morning and 2 capsules at bedtime     ? dulaglutide (TRULICITY) 1.5 mg/0.5 mL injection pen Inject 0.5 mL under the skin  every 7 days. 4 each 11   ? fish oil /omega-3 fatty acids (SEA-OMEGA) 340/1000 mg capsule Take two capsules by mouth twice daily. 1400 mg each capsule     ? fluticasone propionate (FLONASE) 50 mcg/actuation nasal spray, suspension Apply two sprays to each nostril as directed daily. Shake bottle gently before using. (Patient taking differently: Apply two sprays to each nostril as directed at bedtime daily. Shake bottle gently before using.) 16 g 4   ? losartan (COZAAR) 50 mg tablet Take one tablet by mouth daily. 90 tablet 3   ? magnesium oxide (MAGOX) 400 mg (241.3 mg magnesium) tablet Take one tablet by mouth daily.     ? metFORMIN (GLUCOPHAGE) 500 mg tablet Take one tablet by mouth twice daily after meals. Indications: type 2 diabetes mellitus 180 tablet 3   ? metoprolol succinate XL (TOPROL XL) 100 mg extended release tablet Take one tablet by mouth twice daily. 180 tablet 3   ? metroNIDAZOLE (METROGEL) 1 % topical gel Apply one g topically to affected area twice daily. 60 g 11   ? montelukast (SINGULAIR) 10 mg tablet Take one tablet by mouth at bedtime daily. PRN     ? omeprazole DR (PRILOSEC) 20 mg capsule TAKE 1 CAPSULE BY MOUTH DAILY BEFORE BREAKFAST 90 capsule 1   ? phenylephrine-cocoa butter (PREPARATION H(PE,CB)) 0.25-88.44 % rectal suppository Insert or Apply one suppository to rectal area as directed every 12 hours. Indications: hemorrhoids 12 each 0   ? rosuvastatin (CRESTOR) 20 mg tablet TAKE ONE TABLET BY MOUTH DAILY 90 tablet 0   ? vitamin E 400 unit capsule Take one capsule by mouth daily.       No current facility-administered medications on file prior to encounter.       Vitals  Estimated body mass index is 32.64 kg/m? as calculated from the following:    Height as of 02/26/22: 180.3 cm (5' 11).    Weight as of 02/26/22: 106.1 kg (234 lb).       Patient appears alert and oriented: Yes  NPO: for greater than 8 hours except for clear liquids  - pt did report food particles coming up when he gagged while brushing his teeth at approx. 4 am. Dr. Allena Katz with anesthesia OK to proceed.   Inpatient IV status: R forearm    Diagnostic Tests  White Blood Cells   Date Value Ref Range Status   11/14/2021 8.6 4.5 - 11.0 K/UL Final     Hemoglobin   Date Value Ref Range Status   11/14/2021 14.0 13.5 - 16.5 GM/DL Final     Hematocrit   Date Value Ref Range Status   11/14/2021 39.8 (L) 40 - 50 % Final     Platelet Count   Date Value Ref Range Status   11/19/2021 168  Final     Sodium   Date Value Ref Range Status   11/14/2021 140 137 - 147 MMOL/L Final     Potassium   Date Value Ref Range Status   11/14/2021 4.1 3.5 - 5.1 MMOL/L Final     Comment:     SLT HEMOLYSIS     Magnesium   Date Value Ref Range Status   10/31/2021 2.2 1.6 - 2.6 mg/dL Final     Blood Urea Nitrogen   Date Value Ref Range Status   11/14/2021 13 7 - 25 MG/DL Final     Creatinine   Date Value Ref Range Status   11/14/2021 0.83 0.4 - 1.24  MG/DL Final     Glucose   Date Value Ref Range Status   11/14/2021 105 (H) 70 - 100 MG/DL Final       Last MAC INR Flow Sheet Entry:    Last recorded Lab results:   INR POC   Date Value Ref Range Status   11/12/2021 1.1 0.8 - 1.2 Final     No results found for: PTT        Blood Cultures  Resulted Micro Last 72 Hrs    No results found         Last TEE date: 11/2021  Last Cardioversion date: None  Echo procedures within the past 30 days:  No results found.      Device Information on File  No results found for: GENERATOR, EPDEVTYP      Additional Comments:  None    Plan:  Dr. Christain Sacramento will plan to proceed with the  TEE.

## 2022-03-13 NOTE — Anesthesia Pre-Procedure Evaluation
Anesthesia Pre-Procedure Evaluation    Name: Tommy Miller      MRN: [id number]     DOB: 03/30/1956     Age: 66 y.o.     Sex: male   _________________________________________________________________________     Procedure Info:   Procedure Information     Date/Time: 03/13/22 1030    Scheduled providers: Randall Hiss, MD    Procedure: TRANSESOPHAGEAL ECHO    Location: Cardiology:[facility]          Physical Assessment  Vital Signs (last filed in past 24 hours):         Patient History   No Known Allergies     Current Medications    Medication Directions   amLODIPine (NORVASC) 10 mg tablet Take one tablet by mouth daily.  Patient taking differently: Take one tablet by mouth daily as needed (elevated BP).   apixaban (ELIQUIS) 5 mg tablet Take one tablet by mouth twice daily.   ascorbic acid (vitamin C) 500 mg tablet Take one tablet by mouth daily.   aspirin EC 81 mg tablet Take one tablet by mouth daily. Take with food.   cetirizine (ZYRTEC) 10 mg tablet Take one tablet by mouth at bedtime daily.   CHOLEcalciferoL (vitamin D3) (VITAMIN D3) 1,000 units tablet Take 1 capsule in the morning and 2 capsules at bedtime   dulaglutide (TRULICITY) 1.5 mg/0.5 mL injection pen Inject 0.5 mL under the skin every 7 days.   fish oil /omega-3 fatty acids (SEA-OMEGA) 340/1000 mg capsule Take two capsules by mouth twice daily. 1400 mg each capsule   fluticasone propionate (FLONASE) 50 mcg/actuation nasal spray, suspension Apply two sprays to each nostril as directed daily. Shake bottle gently before using.  Patient taking differently: Apply two sprays to each nostril as directed at bedtime daily. Shake bottle gently before using.   losartan (COZAAR) 50 mg tablet Take one tablet by mouth daily.   magnesium oxide (MAGOX) 400 mg (241.3 mg magnesium) tablet Take one tablet by mouth daily.   metFORMIN (GLUCOPHAGE) 500 mg tablet Take one tablet by mouth twice daily after meals. Indications: type 2 diabetes mellitus metoprolol succinate XL (TOPROL XL) 100 mg extended release tablet Take one tablet by mouth twice daily.   metroNIDAZOLE (METROGEL) 1 % topical gel Apply one g topically to affected area twice daily.   montelukast (SINGULAIR) 10 mg tablet Take one tablet by mouth at bedtime daily. PRN   omeprazole DR (PRILOSEC) 20 mg capsule TAKE 1 CAPSULE BY MOUTH DAILY BEFORE BREAKFAST   phenylephrine-cocoa butter (PREPARATION H(PE,CB)) 0.25-88.44 % rectal suppository Insert or Apply one suppository to rectal area as directed every 12 hours. Indications: hemorrhoids   rosuvastatin (CRESTOR) 20 mg tablet TAKE ONE TABLET BY MOUTH DAILY   vitamin E 400 unit capsule Take one capsule by mouth daily.         Review of Systems/Medical History        PONV Screening: Non-smoker and Hx PONV/motion sickness  No history of anesthetic complications  No family history of anesthetic complications      Airway - negative        Pulmonary      Not a current smoker        No indications/hx of asthma        Obstructive Sleep Apnea          Interventions: CPAP; compliant      Cardiovascular         Exercise tolerance: >4  METS      Hypertension,                   Dysrhythmias (RBBB s/p watchman 11/12/21); atrial fibrillation      Hyperlipidemia      GI/Hepatic/Renal           GERD,       Liver disease (NAFLD):       No renal disease:          Neuro/Psych       No seizures      No CVA          Endocrine/Other       Diabetes, poorly controlled, type 2; using insulin      Most recent Hgb A1C:> 9          Obesity      Constitution - negative       Physical Exam    Airway Findings      Mallampati: II      TM distance: >3 FB      Neck ROM: full      Mouth opening: good      Airway patency: adequate    Dental Findings: Negative      Cardiovascular Findings:       Rhythm: irregular      Rate: normal    Pulmonary Findings:       Breath sounds clear to auscultation.    Abdominal Findings:       Obese    Neurological Findings:       Alert and oriented x 3    Constitutional findings:       No acute distress       Diagnostic Tests  Hematology:   Lab Results   Component Value Date    HGB 14.0 11/14/2021    HCT 39.8 11/14/2021    PLTCT 168 11/19/2021    WBC 8.6 11/14/2021    NEUT 65 11/14/2021    ANC 5.58 11/14/2021    ALC 1.97 11/14/2021    MONA 9 11/14/2021    AMC 0.80 11/14/2021    EOSA 2 11/14/2021    ABC 0.05 11/14/2021    MCV 99.6 11/14/2021    MCH 34.9 11/14/2021    MCHC 35.1 11/14/2021    MPV 8.5 11/14/2021    RDW 14.0 11/14/2021         General Chemistry:   Lab Results   Component Value Date    NA 140 11/14/2021    K 4.1 11/14/2021    CL 101 11/14/2021    CO2 28 11/14/2021    GAP 11 11/14/2021    BUN 13 11/14/2021    CR 0.83 11/14/2021    GLU 105 11/14/2021    CA 9.0 11/14/2021    ALBUMIN 4.5 08/17/2021    MG 2.2 10/31/2021    TOTBILI 1.2 08/17/2021      Coagulation:   Lab Results   Component Value Date    INR 1.1 11/12/2021       ECHO 11/14/21  ? Qualitatively normal biventricular systolic function.  ? Small circumferential pericardial effusion.  ? Elevated estimated CVP at 5-10 mmHg.  ?  Compared to prior limited exam dated 11/13/2021: No clinically significant interval change.  Pericardial effusion appears unchanged.  ? Qualitatively normal biventricular systolic function.  ? Small circumferential pericardial effusion.  ? Elevated estimated CVP at 5-10 mmHg.  ?  Compared to prior limited exam dated 11/13/2021: No clinically significant interval change.  Pericardial effusion appears unchanged.    Procedural TEE 11/12/21  ?  Post Procedure:  1. Well-seated, well-positioned 31 mm WATCHMAN FLX? left atrial appendage closure device.   2. Color Doppler shows no flow seen through or around the closure device.  3. No device related thrombus.  4. No pericardial effusion.  5. A small, left to right interatrial shunt is noted at the site of the trans-septal puncture.        Treadmill stress test 3/32/21  1. No evidence of stress induced ischemia   2. Low risk test     EKG 10/30/21  IMPRESSION  Normal sinus rhythm  Right bundle branch block  Septal infarct (cited on or before 17-Aug-2021)  Abnormal ECG  When compared with ECG of 17-Aug-2021 09:40,  Sinus rhythm has replaced Atrial fibrillation  Questionable change in initial forces of Anterior leads  Non-specific change in ST segment in Inferior leads  Nonspecific T wave abnormality now evident in Inferior leads  T wave inversion no longer evident in Anterolateral leads    PFT's 04/28/19  FVC-Pre 3.89    L Final   FVC-%Pred-pre 86    % Final   FVC-Post 3.74    L Final   FVC-%Pred-Post 83    % Final   FEV1-Pre 2.90    L Final   FEV1-%Pred-Pre 84    % Final   FEV1-Post 2.98    L Final   FEV1-%Pred-Post 86    % Final   FEV1/FVC-Pre 75    % Final   FEV1FVC-LLN 65    % Final     PAC plan  Labs: CBC, CMP on DOS  ROI: none  Studies: none  Consults: none     Anesthesia Plan    ASA score: 3   Plan: MAC  Induction method: intravenous  NPO status: acceptable      Informed Consent  Anesthetic plan and risks discussed with patient.  Use of blood products discussed with patient  Blood Consent: consented      Plan discussed with: CRNA, surgeon/proceduralist and anesthesiologist.    [facility] Plan      Alerts

## 2022-03-15 ENCOUNTER — Encounter: Admit: 2022-03-15 | Discharge: 2022-03-15 | Payer: MEDICARE

## 2022-03-15 MED ORDER — CLOPIDOGREL 75 MG PO TAB
75 mg | ORAL_TABLET | Freq: Every day | ORAL | 1 refills | 90.00000 days | Status: AC
Start: 2022-03-15 — End: ?

## 2022-03-15 NOTE — Telephone Encounter
-----   Message from [place], [place] sent at 03/14/2022  8:44 AM CDT -----  Regarding: FW: ESH post Watchman med mgmt    ----- Message -----  From: Tempie Hoist, BSN  Sent: 03/14/2022  12:00 AM CDT  To: [facility] Nurse Interventional Team [place]  Subject: ESH post Watchman med mgmt                       Please call the patient to advise of medication changes after the post Watchman TEE has resulted.  Thanks

## 2022-03-15 NOTE — Telephone Encounter
Advised patient of TEE results.  Patient may discontinue eliquis today, continue aspirin and start plavix 75mg daily for 6 months post device implant.  Patient will continue daily baby aspirin indefinitely.

## 2022-03-17 ENCOUNTER — Other Ambulatory Visit: Payer: Self-pay | Admitting: Internal Medicine

## 2022-03-17 DIAGNOSIS — E7800 Pure hypercholesterolemia, unspecified: Secondary | ICD-10-CM

## 2022-03-20 DIAGNOSIS — N3946 Mixed incontinence: Secondary | ICD-10-CM | POA: Diagnosis not present

## 2022-03-20 DIAGNOSIS — M6281 Muscle weakness (generalized): Secondary | ICD-10-CM | POA: Diagnosis not present

## 2022-03-20 DIAGNOSIS — Z8546 Personal history of malignant neoplasm of prostate: Secondary | ICD-10-CM | POA: Diagnosis not present

## 2022-03-26 ENCOUNTER — Other Ambulatory Visit: Payer: Self-pay | Admitting: *Deleted

## 2022-03-26 MED ORDER — EMPAGLIFLOZIN 25 MG PO TABS: 25.0000 mg | ORAL_TABLET | Freq: Every day | ORAL | 0 refills | Status: DC

## 2022-04-05 ENCOUNTER — Encounter: Admit: 2022-04-05 | Discharge: 2022-04-05 | Payer: MEDICARE

## 2022-04-10 ENCOUNTER — Ambulatory Visit (INDEPENDENT_AMBULATORY_CARE_PROVIDER_SITE_OTHER): Payer: Medicare Other | Admitting: *Deleted

## 2022-04-10 DIAGNOSIS — Z23 Encounter for immunization: Secondary | ICD-10-CM

## 2022-04-28 ENCOUNTER — Other Ambulatory Visit: Payer: Self-pay | Admitting: Internal Medicine

## 2022-04-28 DIAGNOSIS — E7800 Pure hypercholesterolemia, unspecified: Secondary | ICD-10-CM

## 2022-05-06 ENCOUNTER — Encounter: Admit: 2022-05-06 | Discharge: 2022-05-06 | Payer: MEDICARE

## 2022-05-06 DIAGNOSIS — M542 Cervicalgia: Secondary | ICD-10-CM | POA: Diagnosis not present

## 2022-05-06 DIAGNOSIS — G8321 Monoplegia of upper limb affecting right dominant side: Secondary | ICD-10-CM | POA: Diagnosis not present

## 2022-05-08 ENCOUNTER — Other Ambulatory Visit: Payer: Self-pay | Admitting: Sports Medicine

## 2022-05-08 DIAGNOSIS — M542 Cervicalgia: Secondary | ICD-10-CM

## 2022-05-12 ENCOUNTER — Ambulatory Visit
Admission: RE | Admit: 2022-05-12 | Discharge: 2022-05-12 | Disposition: A | Discharge status: Home / Self Care | Payer: Medicare Other | Source: Ambulatory Visit | Attending: Sports Medicine | Admitting: Sports Medicine

## 2022-05-12 DIAGNOSIS — M542 Cervicalgia: Secondary | ICD-10-CM

## 2022-05-12 DIAGNOSIS — R519 Headache, unspecified: Secondary | ICD-10-CM | POA: Diagnosis not present

## 2022-05-12 DIAGNOSIS — I771 Stricture of artery: Secondary | ICD-10-CM | POA: Diagnosis not present

## 2022-05-21 ENCOUNTER — Ambulatory Visit: Admit: 2022-05-21 | Discharge: 2022-05-22 | Payer: MEDICARE

## 2022-05-21 ENCOUNTER — Encounter: Admit: 2022-05-21 | Discharge: 2022-05-21 | Payer: MEDICARE

## 2022-05-21 DIAGNOSIS — S4991XA Unspecified injury of right shoulder and upper arm, initial encounter: Secondary | ICD-10-CM

## 2022-05-21 DIAGNOSIS — I480 Paroxysmal atrial fibrillation: Secondary | ICD-10-CM

## 2022-05-21 DIAGNOSIS — I10 Essential (primary) hypertension: Secondary | ICD-10-CM

## 2022-05-21 DIAGNOSIS — R002 Palpitations: Secondary | ICD-10-CM

## 2022-05-21 DIAGNOSIS — K219 Gastro-esophageal reflux disease without esophagitis: Secondary | ICD-10-CM

## 2022-05-21 DIAGNOSIS — G4733 Obstructive sleep apnea (adult) (pediatric): Secondary | ICD-10-CM

## 2022-05-21 DIAGNOSIS — R112 Nausea with vomiting, unspecified: Secondary | ICD-10-CM

## 2022-05-21 DIAGNOSIS — I4891 Unspecified atrial fibrillation: Secondary | ICD-10-CM

## 2022-05-21 NOTE — Progress Notes
Date of Service: 05/21/2022    Tommy Miller is a 66 y.o. male.       HPI     I saw Tommy Miller via telemedicine.  Dr. Lorain Childes referred Tommy Miller to me for consideration of left atrial appendage closure.  Tommy Miller is a 66 year old gentleman who has a past medical history of hypertension, obstructive sleep apnea, obesity and paroxysmal atrial fibrillation.  The atrial fibrillation was diagnosed in 2022 during a very stressful period of time when he was caring for his ill father.  He was having episodes up to 14 hours in duration with an overall burden of 4%.  After being placed on anticoagulation he was scheduled for atrial fibrillation but developed severe hemorrhoid bleeding.  The ablation was initially canceled.  They then plan for combination left atrial appendage closure and atrial fibrillation ablation.    He underwent closure of his left atrial appendage in May 2023.  He went underwent atrial fibrillation ablation on the same day as the left atrial appendage closure she was done with with a  31 mm Watchman Flex device.  He underwent a 45-day transesophageal echocardiac which revealed the device was well-seated without a peridevice leak.    He has no specific complaints at this time.         There were no vitals filed for this visit.  There is no height or weight on file to calculate BMI.     Past Medical History  Patient Active Problem List    Diagnosis Date Noted   ? Venous stasis dermatitis of both lower extremities 02/26/2022   ? H/O radiofrequency ablation for complex left atrial arrhythmia 12/28/2021   ? Presence of Watchman left atrial appendage closure device 11/12/2021     11/12/2021 Successful deployment and occlusion of the left atrial appendage using a 31 mm Watchman Flex device by Dr. Micheline Rough     ? Paroxysmal atrial fibrillation (HCC) 11/12/2021   ? Type 2 diabetes mellitus without complication, without long-term current use of insulin (HCC) 08/30/2021     Diabetes Preventive Care  A1c (q38mos): 9.4 on 08/2021, pending today  Lipids (qYr): last checked on 08/2021       Statin: Reviewed statin 20 mg daily       ASA: Aspirin 81 mg daily  RFP (qYr): last Cr 0.3, GFR greater than 60 on 5/35/3   Urine ZOX:[id number] ratio (qYr): Pending  DM/diet/exercise education: labs discussed on _  BP control: Elevated, increased losartan today  ACE-I for renal protection: Currently on losartan 50 mg daily  Glucometer: 100-150 fasting and preprandial (if only on oral medications: check BG when ill; if on insulin: check fasting and 2-hr PC BGs)  Foot exam: last checked on 02/26/2022  Eye exam: last seen by Dr. Orson Gear on 8/22  Pneumococcal vaccine (PCV 20): Declined  Flu vaccine (qYr): _     ? Non-alcoholic fatty liver disease 08/29/2021   ? Subclinical hypothyroidism 08/29/2021     The patient had an elevated TSH of 5.5, but has normal T4 of 0.7.     ? Polycythemia 08/29/2021   ? BRBPR (bright red blood per rectum) 08/20/2021   ? Obesity (BMI 30-39.9) 06/15/2021   ? RBBB 06/15/2021   ? Erectile dysfunction due to arterial insufficiency 05/01/2021   ? Encounter for Medicare annual wellness exam 05/01/2021     Male Wellness Exam   The patient is a lifetime non-smoker.   Alcohol abuse screening/behavioral counseling: Patient does not drink alcohol.  Colorectal cancer screening (age 28-75 - high sensitivity FOBT q54yr OR sigmoidoscopy q8yr + FOBT q26yr OR colonoscopy q39yr): Patient had normal screening colonoscopy September 2022.  Depression screening (q30yr): PHQ 2-0  T2DM screening (asymptomatic adult with sustained BP >135/80 - fasting BG, 2-hour postprandial OR hemoglobin A1C): The patient had normal hemoglobin A1c April 2022, he has had elevated fasting blood glucose on CMP's.  Falls screening (Rx exercise, PT and/or vitamin D to community-dwelling over age 59 at increased risk): _  Lipid disorder screening (q46yr, age 46-35 if high risk, all men over age 48): Patient had remarkably elevated triglycerides along with increased LDL and decreased HDL April 2022  Skin cancer counseling (age 36-24 w/ fair skin): _ - Provide counseling about minimizing exposure to ultraviolet radiation to reduce risk for skin cancer.  Flu vaccine (q77yr): Declined  Zoster vaccine (Shingrix for >50 yo even if previously received Zostavax, 2 shot regimen: 1st, 2nd one 2-6 months later): _  Pneumococcal vaccine (PCV20): _     ? Hemorrhoids, internal, with bleeding    ? HTN (hypertension) 12/26/2020   ? OSA (obstructive sleep apnea) 12/26/2020     Per OV note on 12/15/2020 with Ronnette Juniper, PA     ? Mixed hyperlipidemia 12/15/2020   ? Acne rosacea 12/15/2020         Review of Systems   Constitutional: Negative.   HENT: Negative.    Eyes: Negative.    Cardiovascular: Negative.    Respiratory: Negative.    Endocrine: Negative.    Hematologic/Lymphatic: Negative.    Skin: Negative.    Musculoskeletal: Negative.    Gastrointestinal: Negative.    Genitourinary: Negative.    Neurological: Negative.    Psychiatric/Behavioral: Negative.    Allergic/Immunologic: Negative.        Physical Exam      Cardiovascular Studies      Cardiovascular Health Factors  Vitals BP Readings from Last 3 Encounters:   03/13/22 (P) 134/85   02/26/22 (!) 140/90   02/15/22 125/75     Wt Readings from Last 3 Encounters:   03/13/22 106.1 kg (234 lb)   02/26/22 106.1 kg (234 lb)   02/15/22 105.3 kg (232 lb 3.2 oz)     BMI Readings from Last 3 Encounters:   03/13/22 32.64 kg/m?   02/26/22 32.64 kg/m?   02/15/22 32.39 kg/m?      Smoking Social History     Tobacco Use   Smoking Status Never   Smokeless Tobacco Never      Lipid Profile Cholesterol   Date Value Ref Range Status   08/17/2021 144 <200 MG/DL Final     HDL   Date Value Ref Range Status   08/17/2021 30 (L) >40 MG/DL Final     LDL   Date Value Ref Range Status   08/17/2021 53 <100 mg/dL Final     Triglycerides   Date Value Ref Range Status   08/17/2021 514 (H) <150 MG/DL Final      Blood Sugar Hemoglobin A1C   Date Value Ref Range Status   08/29/2021 9.4 (H) 4.0 - 5.7 % Final     Glucose   Date Value Ref Range Status   11/14/2021 105 (H) 70 - 100 MG/DL Final   16/04/9603 540 (H) 70 - 100 MG/DL Final   98/05/9146 97 70 - 100 MG/DL Final     Glucose, POC   Date Value Ref Range Status   03/13/2022 131 (H) 70 - 100 MG/DL Final  11/12/2021 121 (H) 70 - 100 MG/DL Final          Problems Addressed Today  No diagnosis found.    Assessment and Plan     Paroxysmal atrial fibrillation.  He underwent ablation of his atrial fibrillation and underwent left atrial pended occlusion with a 31 mm watchman flex device immediately following the ablation.  He is doing quite well with no postprocedural complications that I can identify.  He is on dual antiplatelet therapy and instructed him to stop the Plavix and continue aspirin indefinitely.  He will follow-up in about 6 months with a repeat transesophageal echocardiogram.  He also has mixed hyperlipidemia and essential hypertension.  I will defer his preventative care to his primary care provider.       I spent 25  minutes in entirety on this visit.    Current Medications (including today's revisions)  ? ascorbic acid (vitamin C) 500 mg tablet Take one tablet by mouth daily.   ? aspirin EC 81 mg tablet Take one tablet by mouth daily. Take with food.   ? cetirizine (ZYRTEC) 10 mg tablet Take one tablet by mouth at bedtime daily.   ? CHOLEcalciferoL (vitamin D3) (VITAMIN D3) 1,000 units tablet Take 1 capsule in the morning and 2 capsules at bedtime   ? clopiDOGreL (PLAVIX) 75 mg tablet Take one tablet by mouth daily.   ? dulaglutide (TRULICITY) 1.5 mg/0.5 mL injection pen Inject 0.5 mL under the skin every 7 days.   ? fish oil /omega-3 fatty acids (SEA-OMEGA) 340/1000 mg capsule Take four capsules by mouth twice daily. 1400 mg each capsule   ? fluticasone propionate (FLONASE) 50 mcg/actuation nasal spray, suspension Apply two sprays to each nostril as directed daily. Shake bottle gently before using. (Patient taking differently: Apply two sprays to each nostril as directed at bedtime daily. Shake bottle gently before using.)   ? losartan (COZAAR) 50 mg tablet Take one tablet by mouth daily.   ? magnesium oxide (MAGOX) 400 mg (241.3 mg magnesium) tablet Take one tablet by mouth daily.   ? metFORMIN (GLUCOPHAGE) 500 mg tablet Take one tablet by mouth twice daily after meals. Indications: type 2 diabetes mellitus   ? metoprolol succinate XL (TOPROL XL) 100 mg extended release tablet Take one tablet by mouth twice daily.   ? metroNIDAZOLE (METROGEL) 1 % topical gel Apply one g topically to affected area twice daily.   ? omeprazole DR (PRILOSEC) 20 mg capsule TAKE 1 CAPSULE BY MOUTH DAILY BEFORE BREAKFAST   ? phenylephrine-cocoa butter (PREPARATION H(PE,CB)) 0.25-88.44 % rectal suppository Insert or Apply one suppository to rectal area as directed every 12 hours. Indications: hemorrhoids   ? rosuvastatin (CRESTOR) 20 mg tablet TAKE 1 TABLET BY MOUTH DAILY   ? vitamin E 400 unit capsule Take one capsule by mouth daily.

## 2022-05-30 ENCOUNTER — Encounter: Payer: Self-pay | Admitting: Internal Medicine

## 2022-05-30 ENCOUNTER — Ambulatory Visit (INDEPENDENT_AMBULATORY_CARE_PROVIDER_SITE_OTHER): Payer: Medicare HMO | Admitting: Internal Medicine

## 2022-05-30 VITALS — BP 167/97 | HR 85 | Temp 98.8°F | Wt 174.8 lb

## 2022-05-30 DIAGNOSIS — J40 Bronchitis, not specified as acute or chronic: Secondary | ICD-10-CM | POA: Diagnosis not present

## 2022-05-30 MED ORDER — AZITHROMYCIN 250 MG PO TABS: ORAL_TABLET | ORAL | 0 refills | Status: DC

## 2022-05-30 NOTE — Progress Notes (Signed)
Acute office Visit     CC/Reason for Visit: Cough x3 weeks  HPI: Jeff Jenkins is a 66 y.o. male who is coming in today for the above mentioned reasons.  He has been having a cough for the better portion of 3 weeks.  It is productive of yellow, white sputum.  He is not known to have COPD.  He has grandchildren and they have been sick.  He took a COVID test that was negative.   Past Medical/Surgical History: Past Medical History:  Diagnosis Date   Allergy    Arthritis    Broken ankle 2008   Coronary artery disease    Diverticulosis    ED (erectile dysfunction)    Facial fracture ([place]) 1979   GERD (gastroesophageal reflux disease)    Hearing loss in right ear    Hemorrhoids    Hiatal hernia    Hyperlipidemia    Hypertension    Peptic stricture of esophagus    Peptic ulcer disease 1989   Prostate cancer ([place]) 02/2009   Seasonal allergies    Type 2 diabetes mellitus ([place])     Past Surgical History:  Procedure Laterality Date   ANKLE SURGERY Left 2008   CHOLECYSTECTOMY N/A 01/31/2020   Procedure: LAPAROSCOPIC CHOLECYSTECTOMY WITH INTRAOPERATIVE CHOLANGIOGRAM;  Surgeon: Clovis Riley, MD;  Location: [facility];  Service: General;  Laterality: N/A;   FACIAL FRACTURE SURGERY     HERNIA REPAIR  09/26/2021   Sugery on 4 hernias in groin area  Dr [facility]   INGUINAL HERNIA REPAIR Left    KNEE ARTHROSCOPY Left 2013   KNEE ARTHROSCOPY  07/03/2020   [facility]   PROSTATECTOMY  2011   TONSILLECTOMY     UPPER GASTROINTESTINAL ENDOSCOPY     VASECTOMY      Social History:  reports that he has never smoked. He has never used smokeless tobacco. He reports current alcohol use of about 12.0 standard drinks of alcohol per week. He reports that he does not use drugs.  Allergies: No Known Allergies  Family History:  Family History  Problem Relation Age of Onset   Breast cancer Mother    Diabetes Mother    Heart attack Mother    Alcohol abuse Father     Stroke Brother    Colon cancer Neg Hx    Esophageal cancer Neg Hx    Rectal cancer Neg Hx    Stomach cancer Neg Hx      Current Outpatient Medications:    acetaminophen (TYLENOL) 325 MG tablet, Take 650 mg by mouth every 6 (six) hours as needed for moderate pain., Disp: , Rfl:    aspirin EC 81 MG tablet, Take 81 mg by mouth daily. Swallow whole., Disp: , Rfl:    atorvastatin (LIPITOR) 40 MG tablet, TAKE 1 TABLET BY MOUTH EVERY DAY, Disp: 90 tablet, Rfl: 1   azelastine (ASTELIN) 0.1 % nasal spray, Place 1 spray into both nostrils 2 (two) times daily. Use in each nostril as directed, Disp: 30 mL, Rfl: 2   azithromycin (ZITHROMAX) 250 MG tablet, Take 2 tablets on day 1, then 1 tablet daily on days 2 through 5, Disp: 6 tablet, Rfl: 0   Calcium Carbonate Antacid (TUMS PO), Take 2 tablets by mouth as needed., Disp: , Rfl:    diclofenac (VOLTAREN) 75 MG EC tablet, Take 75 mg by mouth 2 (two) times daily., Disp: , Rfl:    dicyclomine (BENTYL) 10 MG capsule, TAKE 1  CAPSULE (10 MG TOTAL) BY MOUTH 3 (THREE) TIMES DAILY AS NEEDED FOR SPASMS., Disp: 270 capsule, Rfl: 5   empagliflozin (JARDIANCE) 25 MG TABS tablet, Take 1 tablet (25 mg total) by mouth daily before breakfast., Disp: 28 tablet, Rfl: 0   esomeprazole (NEXIUM) 40 MG capsule, TAKE 1 CAPSULE BY MOUTH DAILY BEFORE BREAKFAST., Disp: 90 capsule, Rfl: 3   ezetimibe (ZETIA) 10 MG tablet, TAKE ONE TABLET BY MOUTH DAILY, Disp: 90 tablet, Rfl: 1   fexofenadine (ALLEGRA) 180 MG tablet, Take 180 mg by mouth daily., Disp: , Rfl:    Lancets (ONETOUCH ULTRASOFT) lancets, 1 each by Other route daily. Dx E11.9, Disp: 100 each, Rfl: 3   lisinopril (ZESTRIL) 5 MG tablet, Take 1 tablet (5 mg total) by mouth daily., Disp: 90 tablet, Rfl: 1   ONETOUCH ULTRA test strip, CHECK BLOOD SUGAR DAILY, Disp: 100 strip, Rfl: 3   sildenafil (VIAGRA) 100 MG tablet, TAKE 1 TABLET BY MOUTH DAILY AS NEEDED, Disp: 10 tablet, Rfl: 11   SYNJARDY XR 25-1000 MG TB24, TAKE 1  TABLET BY MOUTH EVERY DAY, Disp: 30 tablet, Rfl: 2   tadalafil (CIALIS) 5 MG tablet, Take 5 mg by mouth daily as needed for erectile dysfunction. , Disp: , Rfl:    Testosterone 30 MG/ACT SOLN, Place onto the skin daily., Disp: , Rfl:   Review of Systems:  Constitutional: Denies fever, chills, diaphoresis, appetite change and fatigue.  HEENT: Denies photophobia, eye pain, redness, hearing loss, ear pain, congestion, sore throat, rhinorrhea, sneezing, mouth sores, trouble swallowing, neck pain, neck stiffness and tinnitus.   Respiratory: Denies SOB, DOE, chest tightness,  and wheezing.   Cardiovascular: Denies chest pain, palpitations and leg swelling.  Gastrointestinal: Denies nausea, vomiting, abdominal pain, diarrhea, constipation, blood in stool and abdominal distention.  Genitourinary: Denies dysuria, urgency, frequency, hematuria, flank pain and difficulty urinating.  Endocrine: Denies: hot or cold intolerance, sweats, changes in hair or nails, polyuria, polydipsia. Musculoskeletal: Denies myalgias, back pain, joint swelling, arthralgias and gait problem.  Skin: Denies pallor, rash and wound.  Neurological: Denies dizziness, seizures, syncope, weakness, light-headedness, numbness and headaches.  Hematological: Denies adenopathy. Easy bruising, personal or family bleeding history  Psychiatric/Behavioral: Denies suicidal ideation, mood changes, confusion, nervousness, sleep disturbance and agitation    Physical Exam: Vitals:   05/30/22 1300 05/30/22 1303  BP: (!) 150/90 (!) 167/97  Pulse: 85   Temp: 98.8 F (37.1 C)   TempSrc: Oral   SpO2: 98%   Weight: 174 lb 12.8 oz (79.3 kg)     Body mass index is 24.38 kg/m.   Constitutional: NAD, calm, comfortable Eyes: PERRL, lids and conjunctivae normal ENMT: Mucous membranes are moist.  Respiratory: Bilateral rhonchi and rales. Normal respiratory effort. No accessory muscle use.   Psychiatric: Normal judgment and insight. Alert and  oriented x 3. Normal mood.    Impression and Plan:  Bronchitis - Plan: azithromycin (ZITHROMAX) 250 MG tablet   -Likely bronchitis based on features and clinical exam. -Given timing of greater than 3 weeks, I think it is appropriate to treat with a Z-Pak. -Have also advised that he do a daily antihistamine and twice daily guaifenesin.     Lelon Frohlich, MD [facility]

## 2022-05-31 ENCOUNTER — Encounter: Admit: 2022-05-31 | Discharge: 2022-05-31 | Payer: MEDICARE

## 2022-06-04 ENCOUNTER — Ambulatory Visit (INDEPENDENT_AMBULATORY_CARE_PROVIDER_SITE_OTHER): Payer: Medicare HMO | Admitting: Internal Medicine

## 2022-06-04 ENCOUNTER — Ambulatory Visit (INDEPENDENT_AMBULATORY_CARE_PROVIDER_SITE_OTHER): Payer: Medicare Other

## 2022-06-04 ENCOUNTER — Encounter: Payer: Self-pay | Admitting: Internal Medicine

## 2022-06-04 VITALS — BP 133/86 | HR 90 | Temp 98.3°F | Wt 175.1 lb

## 2022-06-04 DIAGNOSIS — R051 Acute cough: Secondary | ICD-10-CM

## 2022-06-04 DIAGNOSIS — J40 Bronchitis, not specified as acute or chronic: Secondary | ICD-10-CM

## 2022-06-04 DIAGNOSIS — R059 Cough, unspecified: Secondary | ICD-10-CM | POA: Diagnosis not present

## 2022-06-04 DIAGNOSIS — I2510 Atherosclerotic heart disease of native coronary artery without angina pectoris: Secondary | ICD-10-CM | POA: Diagnosis not present

## 2022-06-04 MED ORDER — BENZONATATE 100 MG PO CAPS: 100.0000 mg | ORAL_CAPSULE | Freq: Two times a day (BID) | ORAL | 1 refills | Status: DC | PRN

## 2022-06-04 NOTE — Progress Notes (Signed)
Established Patient Office Visit     CC/Reason for Visit: Continued cough  HPI: Jeff Jenkins is a 66 y.o. male who is coming in today for the above mentioned reasons.  He was seen last week and diagnosed with bronchitis.  Given a Z-Pak.  He is here today with continued cough.   Past Medical/Surgical History: Past Medical History:  Diagnosis Date   Allergy    Arthritis    Broken ankle 2008   Coronary artery disease    Diverticulosis    ED (erectile dysfunction)    Facial fracture ([place]) 1979   GERD (gastroesophageal reflux disease)    Hearing loss in right ear    Hemorrhoids    Hiatal hernia    Hyperlipidemia    Hypertension    Peptic stricture of esophagus    Peptic ulcer disease 1989   Prostate cancer ([place]) 02/2009   Seasonal allergies    Type 2 diabetes mellitus ([place])     Past Surgical History:  Procedure Laterality Date   ANKLE SURGERY Left 2008   CHOLECYSTECTOMY N/A 01/31/2020   Procedure: LAPAROSCOPIC CHOLECYSTECTOMY WITH INTRAOPERATIVE CHOLANGIOGRAM;  Surgeon: Clovis Riley, MD;  Location: [facility];  Service: General;  Laterality: N/A;   FACIAL FRACTURE SURGERY     HERNIA REPAIR  09/26/2021   Sugery on 4 hernias in groin area  Dr [facility]   INGUINAL HERNIA REPAIR Left    KNEE ARTHROSCOPY Left 2013   KNEE ARTHROSCOPY  07/03/2020   [facility]   PROSTATECTOMY  2011   TONSILLECTOMY     UPPER GASTROINTESTINAL ENDOSCOPY     VASECTOMY      Social History:  reports that he has never smoked. He has never used smokeless tobacco. He reports current alcohol use of about 12.0 standard drinks of alcohol per week. He reports that he does not use drugs.  Allergies: No Known Allergies  Family History:  Family History  Problem Relation Age of Onset   Breast cancer Mother    Diabetes Mother    Heart attack Mother    Alcohol abuse Father    Stroke Brother    Colon cancer Neg Hx    Esophageal cancer Neg Hx    Rectal cancer Neg Hx     Stomach cancer Neg Hx      Current Outpatient Medications:    acetaminophen (TYLENOL) 325 MG tablet, Take 650 mg by mouth every 6 (six) hours as needed for moderate pain., Disp: , Rfl:    aspirin EC 81 MG tablet, Take 81 mg by mouth daily. Swallow whole., Disp: , Rfl:    atorvastatin (LIPITOR) 40 MG tablet, TAKE 1 TABLET BY MOUTH EVERY DAY, Disp: 90 tablet, Rfl: 1   azelastine (ASTELIN) 0.1 % nasal spray, Place 1 spray into both nostrils 2 (two) times daily. Use in each nostril as directed, Disp: 30 mL, Rfl: 2   benzonatate (TESSALON) 100 MG capsule, Take 1 capsule (100 mg total) by mouth 2 (two) times daily as needed for cough., Disp: 20 capsule, Rfl: 1   Calcium Carbonate Antacid (TUMS PO), Take 2 tablets by mouth as needed., Disp: , Rfl:    diclofenac (VOLTAREN) 75 MG EC tablet, Take 75 mg by mouth 2 (two) times daily., Disp: , Rfl:    dicyclomine (BENTYL) 10 MG capsule, TAKE 1 CAPSULE (10 MG TOTAL) BY MOUTH 3 (THREE) TIMES DAILY AS NEEDED FOR SPASMS., Disp: 270 capsule, Rfl: 5   empagliflozin (JARDIANCE) 25 MG  TABS tablet, Take 1 tablet (25 mg total) by mouth daily before breakfast., Disp: 28 tablet, Rfl: 0   esomeprazole (NEXIUM) 40 MG capsule, TAKE 1 CAPSULE BY MOUTH DAILY BEFORE BREAKFAST., Disp: 90 capsule, Rfl: 3   ezetimibe (ZETIA) 10 MG tablet, TAKE ONE TABLET BY MOUTH DAILY, Disp: 90 tablet, Rfl: 1   fexofenadine (ALLEGRA) 180 MG tablet, Take 180 mg by mouth daily., Disp: , Rfl:    Lancets (ONETOUCH ULTRASOFT) lancets, 1 each by Other route daily. Dx E11.9, Disp: 100 each, Rfl: 3   lisinopril (ZESTRIL) 5 MG tablet, Take 1 tablet (5 mg total) by mouth daily., Disp: 90 tablet, Rfl: 1   ONETOUCH ULTRA test strip, CHECK BLOOD SUGAR DAILY, Disp: 100 strip, Rfl: 3   sildenafil (VIAGRA) 100 MG tablet, TAKE 1 TABLET BY MOUTH DAILY AS NEEDED, Disp: 10 tablet, Rfl: 11   SYNJARDY XR 25-1000 MG TB24, TAKE 1 TABLET BY MOUTH EVERY DAY, Disp: 30 tablet, Rfl: 2   tadalafil (CIALIS) 5 MG tablet,  Take 5 mg by mouth daily as needed for erectile dysfunction. , Disp: , Rfl:    Testosterone 30 MG/ACT SOLN, Place onto the skin daily., Disp: , Rfl:   Review of Systems:  Constitutional: Denies fever, chills, diaphoresis, appetite change and fatigue.  HEENT: Denies photophobia, eye pain, redness,  mouth sores, trouble swallowing, neck pain, neck stiffness and tinnitus.   Respiratory: Denies SOB, DOE,  chest tightness,  and wheezing.   Cardiovascular: Denies chest pain, palpitations and leg swelling.  Gastrointestinal: Denies nausea, vomiting, abdominal pain, diarrhea, constipation, blood in stool and abdominal distention.  Genitourinary: Denies dysuria, urgency, frequency, hematuria, flank pain and difficulty urinating.  Endocrine: Denies: hot or cold intolerance, sweats, changes in hair or nails, polyuria, polydipsia. Musculoskeletal: Denies myalgias, back pain, joint swelling, arthralgias and gait problem.  Skin: Denies pallor, rash and wound.  Neurological: Denies dizziness, seizures, syncope, weakness, light-headedness, numbness and headaches.  Hematological: Denies adenopathy. Easy bruising, personal or family bleeding history  Psychiatric/Behavioral: Denies suicidal ideation, mood changes, confusion, nervousness, sleep disturbance and agitation    Physical Exam: Vitals:   06/04/22 1037 06/04/22 1040  BP: (!) 140/80 133/86  Pulse: 90   Temp: 98.3 F (36.8 C)   TempSrc: Oral   SpO2: 96%   Weight: 175 lb 1.6 oz (79.4 kg)     Body mass index is 24.42 kg/m.   Constitutional: NAD, calm, comfortable Eyes: PERRL, lids and conjunctivae normal ENMT: Mucous membranes are moist.  Respiratory: clear to auscultation bilaterally, no wheezing, no crackles. Normal respiratory effort. No accessory muscle use.  Cardiovascular: Bilateral Rales, no wheezes, no crackles, no murmurs / rubs / gallops. No extremity edema.   Psychiatric: Normal judgment and insight. Alert and oriented x 3. Normal  mood.    Impression and Plan:  Bronchitis - Plan: DG Chest 2 View  Acute cough - Plan: DG Chest 2 View, benzonatate (TESSALON) 100 MG capsule  -Check chest x-ray as he has had this cough for about 4 weeks at this venue. -Sent Tessalon for cough.  Time spent: 30 minutes reviewing chart, interviewing and examining patient and formulating plan of care.    Lelon Frohlich, MD [facility]

## 2022-06-05 ENCOUNTER — Encounter: Payer: Self-pay | Admitting: Internal Medicine

## 2022-06-09 ENCOUNTER — Encounter: Payer: Self-pay | Admitting: Internal Medicine

## 2022-06-19 ENCOUNTER — Encounter: Admit: 2022-06-19 | Discharge: 2022-06-19 | Payer: MEDICARE

## 2022-06-19 DIAGNOSIS — Z95818 Presence of other cardiac implants and grafts: Secondary | ICD-10-CM

## 2022-06-20 ENCOUNTER — Encounter: Payer: Self-pay | Admitting: Internal Medicine

## 2022-06-20 MED ORDER — EMPAGLIFLOZIN 25 MG PO TABS: 25.0000 mg | ORAL_TABLET | Freq: Every day | ORAL | 0 refills | Status: DC

## 2022-06-24 NOTE — Progress Notes (Signed)
Cardiology Office Note:    Date:  06/26/2022   ID:  Jeff Jenkins, DOB January 19, 1956, MRN [id number]  PCP:  Isaac Bliss, Rayford Halsted, MD  Cardiologist:  Buford Dresser, MD  Referring MD: Isaac Bliss, [place]*   CC: Follow-up  History of Present Illness:    Jeff Jenkins is a 66 y.o. male with a hx of  significant coronary calcification, nonobstructive CAD, family history of heart disease, type II diabetes lifestyle controlled who is seen for follow up today. He was initially seen 04/04/19 as a new consult at the request of Isaac Bliss, [place]* for the evaluation and management of elevated cardiovascular risk.  CV history/risk factors: Strong family history: lost mother to his heart attack suddenly at age 47 (had evidence of old MI that was unknown). Lost his brother to a massive stroke at age 71.   Today, he is accompanied by his wife. He reports that in June he had an injury to his right shoulder and neck while playing baseball. He complained of intermittent tingling/numbness of his right fingers, which is ongoing. After seeing his orthopedist, arthritis was found in his neck and he was started on steroidal treatment. In October one night, he rose from a sitting position with sudden onset of complete numbness in his RUE and shoulder, "like he didn't have an arm." He notes that his arm was dangling and he was unable to grip a water bottle. This lasted for 3-4 minutes before it spontaneously resolved. This event recurred the next night, but has not occurred since then. Brain MRI was negative.  Typically he stays active. He bowls twice a week, and continues to have mild tingling and numbness sensations in his right fingers intermittently. He has shortness of breath with climbing stairs. However he may go hunting and not become short of breath when climbing up a deer stand. He has not received another steroidal injection in his left knee since January. This had worked well  for a time but he states he will need another injection.  Additionally he complains of a dry cough for 7 weeks. He notes that he did have bronchitis and was treated. Lately he believes the cough is more related to sinus drainage.  He monitors his blood pressure at home, which is normally in the 120s-130s/70s.  He denies any palpitations, chest pain, or peripheral edema. No lightheadedness, headaches, syncope, orthopnea, or PND.   Past Medical History:  Diagnosis Date   Allergy    Arthritis    Broken ankle 2008   Coronary artery disease    Diverticulosis    ED (erectile dysfunction)    Facial fracture ([place]) 1979   GERD (gastroesophageal reflux disease)    Hearing loss in right ear    Hemorrhoids    Hiatal hernia    Hyperlipidemia    Hypertension    Peptic stricture of esophagus    Peptic ulcer disease 1989   Prostate cancer ([place]) 02/2009   Seasonal allergies    Type 2 diabetes mellitus ([place])     Past Surgical History:  Procedure Laterality Date   ANKLE SURGERY Left 2008   CHOLECYSTECTOMY N/A 01/31/2020   Procedure: LAPAROSCOPIC CHOLECYSTECTOMY WITH INTRAOPERATIVE CHOLANGIOGRAM;  Surgeon: Clovis Riley, MD;  Location: [facility];  Service: General;  Laterality: N/A;   FACIAL FRACTURE SURGERY     HERNIA REPAIR  09/26/2021   Sugery on 4 hernias in groin area  [facility]   INGUINAL HERNIA REPAIR Left  KNEE ARTHROSCOPY Left 2013   KNEE ARTHROSCOPY  07/03/2020   [facility]   PROSTATECTOMY  2011   TONSILLECTOMY     UPPER GASTROINTESTINAL ENDOSCOPY     VASECTOMY      Current Medications: Current Outpatient Medications on File Prior to Visit  Medication Sig   acetaminophen (TYLENOL) 325 MG tablet Take 650 mg by mouth every 6 (six) hours as needed for moderate pain.   aspirin EC 81 MG tablet Take 81 mg by mouth daily. Swallow whole.   atorvastatin (LIPITOR) 40 MG tablet TAKE 1 TABLET BY MOUTH EVERY DAY   azelastine (ASTELIN) 0.1 % nasal spray Place 1 spray  into both nostrils 2 (two) times daily. Use in each nostril as directed   benzonatate (TESSALON) 100 MG capsule Take 1 capsule (100 mg total) by mouth 2 (two) times daily as needed for cough.   Calcium Carbonate Antacid (TUMS PO) Take 2 tablets by mouth as needed.   diclofenac (VOLTAREN) 75 MG EC tablet Take 75 mg by mouth 2 (two) times daily.   dicyclomine (BENTYL) 10 MG capsule TAKE 1 CAPSULE (10 MG TOTAL) BY MOUTH 3 (THREE) TIMES DAILY AS NEEDED FOR SPASMS.   empagliflozin (JARDIANCE) 25 MG TABS tablet Take 1 tablet (25 mg total) by mouth daily before breakfast.   esomeprazole (NEXIUM) 40 MG capsule TAKE 1 CAPSULE BY MOUTH DAILY BEFORE BREAKFAST.   ezetimibe (ZETIA) 10 MG tablet TAKE ONE TABLET BY MOUTH DAILY   fexofenadine (ALLEGRA) 180 MG tablet Take 180 mg by mouth daily.   Lancets (ONETOUCH ULTRASOFT) lancets 1 each by Other route daily. Dx E11.9   ONETOUCH ULTRA test strip CHECK BLOOD SUGAR DAILY   sildenafil (VIAGRA) 100 MG tablet TAKE 1 TABLET BY MOUTH DAILY AS NEEDED   SYNJARDY XR 25-1000 MG TB24 TAKE 1 TABLET BY MOUTH EVERY DAY   tadalafil (CIALIS) 5 MG tablet Take 5 mg by mouth daily as needed for erectile dysfunction.    Testosterone 30 MG/ACT SOLN Place onto the skin daily.   No current facility-administered medications on file prior to visit.     Allergies:   Patient has no known allergies.   Social History   Tobacco Use   Smoking status: Never   Smokeless tobacco: Never  Vaping Use   Vaping Use: Never used  Substance Use Topics   Alcohol use: Yes    Alcohol/week: 12.0 standard drinks of alcohol    Types: 12 Standard drinks or equivalent per week    Comment: social    Drug use: No    Family History: The patient's family history includes Alcohol abuse in his father; Breast cancer in his mother; Diabetes in his mother; Heart attack in his mother; Stroke in his brother. There is no history of Colon cancer, Esophageal cancer, Rectal cancer, or Stomach cancer.  ROS:    Please see the history of present illness.   (+) Mild tingling, numbness of right fingers (+) Arthralgias (+) Shortness of breath (+) Dry cough Additional pertinent ROS otherwise negative  EKGs/Labs/Other Studies Reviewed:    The following studies were reviewed today:  Cardiac CT 05-18-19 Aorta: Mildly dilated aortic root (42 mm). Minimal calcifications. No dissection.   Aortic Valve:  Trileaflet.  No calcifications.  Coronary Arteries:  Normal coronary origin.  Right dominance.   RCA is a large dominant artery that gives rise to PDA and PLVB. There is diffuse minimal calcified plaque (0-24%); mild calcified plaque (25-49%) at takeoff of PDA. Minimal plaque (0-24%) in  posterolateral.   Left main is a large artery that gives rise to LAD and LCX arteries. There is no plaque.   LAD is a large vessel that has 2 diagonals. There is calcified plaque in the proximal (50-69%) and mid vessel (0-24%); there is mild calcified plaque (25-49%) in proximal D1.   LCX is a non-dominant artery that gives rise to small OM1 and OM2. There is minimal calcified plaque (0-24%) noted.   Other findings:  Normal pulmonary vein drainage into the left atrium.  Normal let atrial appendage without a thrombus.  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   IMPRESSION: 1. Coronary calcium score of 2319. This was 21 percentile for age and sex matched control.   2. Normal coronary origin with right dominance.   3. Diffuse nonobstructive 3 vessel calcified plaque; 50-69% stenosis in proximal LAD; 25-49% stenosis proximal D1 and in RCA at takeoff of PDA. CADRADS-3.  Monitor report 04/2019 14 days of data recorded on Zio monitor. Patient had a min HR of 48 bpm, max HR of 184 bpm, and avg HR of 86 bpm. Predominant underlying rhythm was Sinus Rhythm. No VT, SVT, atrial fibrillation, high degree block, or pauses noted. Isolated atrial ectopy was rare (<1%). Isolated ventricular ectopy was occasional (2.6%). There was a  single 4 beat run of SVT and a single 4 beat run of PVCs.  Longest ventricular bigeminy was 23 seconds, and longest ventricular trigeminy episode was 38 seconds. There were 0 triggered events.    EKG:  EKG is personally reviewed.  06/25/2022: not ordered today 06/22/2021: Sinus rhythm. Rate 79 bpm. 12/12/20: SR with PVC couplet, HR 93 bpm 03/25/19: SR with PVCs--two morphologies, and 2 PVCs are consecutive  Recent Labs: 12/26/2021: ALT 18; BUN 15; Creatinine, Ser 0.62; Hemoglobin 15.0; Platelets 164.0; Potassium 4.1; Sodium 139   Recent Lipid Panel    Component Value Date/Time   CHOL 124 12/26/2021 0912   CHOL 170 08/23/2019 1221   TRIG 46.0 12/26/2021 0912   HDL 59.40 12/26/2021 0912   HDL 66 08/23/2019 1221   CHOLHDL 2 12/26/2021 0912   VLDL 9.2 12/26/2021 0912   LDLCALC 56 12/26/2021 0912   LDLCALC 53 06/22/2020 1052    Physical Exam:    VS:  BP 128/72 (BP Location: Right Arm, Patient Position: Sitting, Cuff Size: Normal)   Pulse 78   Ht '5\' 11"'$  (1.803 m)   Wt 176 lb 8 oz (80.1 kg)   SpO2 92%   BMI 24.62 kg/m     Wt Readings from Last 3 Encounters:  06/25/22 176 lb 8 oz (80.1 kg)  06/04/22 175 lb 1.6 oz (79.4 kg)  05/30/22 174 lb 12.8 oz (79.3 kg)   GEN: Well nourished, well developed in no acute distress HEENT: Normal, moist mucous membranes NECK: No JVD CARDIAC: regular rhythm, normal S1 and S2, no rubs or gallops. No murmur. VASCULAR: Radial and DP pulses 2+ bilaterally. No carotid bruits RESPIRATORY:  Clear bilaterally without rales, or rhonchi. Mild wheezing noted bilaterally ABDOMEN: Soft, non-tender, non-distended MUSCULOSKELETAL:  Ambulates independently.  SKIN: Warm and dry, no edema NEUROLOGIC:  Alert and oriented x 3. No focal neuro deficits noted. PSYCHIATRIC:  Normal affect     ASSESSMENT:    1. Dry cough   2. Type 2 diabetes mellitus without obesity ([place])   3. Nonocclusive coronary atherosclerosis of native coronary artery   4. Pure  hypercholesterolemia   5. Essential hypertension     PLAN:    Dry cough -had  recent viral infection, but cough has lingered.  -will trial change from ACEi to ARB, monitor for resolution of cough. If does not improve, suspect it may be postviral, especially as he has some mild wheezing on exam today  Nonobstructive CAD Significant coronary calcification -while no plaque with >70% stenosis suggest that chest pain is not ischemic in origin, his very high calcium score and mixed/lipid rich plaque in proximal LAD is concerning. Has diffuse calcified plaue in 3 vessels as well as focal mixed/lipid rich plaque -continue aspirin 81 mg  -tolerating statin, as below  Hypercholesterolemia: goal LDL <70 given CAD -he is tolerating atorvastatin 40 mg daily, continue -on ezetimibe 10 mg daily as well -From 02/08/19 (prior to statin): Tchol 181, TG 102, HDL 37, LDL 124 -post statin lipids 08/23/19: Tchol 170, TG 70, HDL 66, LDL 91 -lipids 06/22/20: Tchol 139, HDL 73, LDL 53, TG 57 -continuing to work on lifestyle  Erectile dysfunction: see prior discussion re: PDE5i  PVCs: burden <3%.  -currently asymptomatic, but if worsens consider echo in the future. Have discussed beta blockers, declines currently. Monitor for symptoms.   Type II diabetes without obesity: BMI 24 -on synjardy, which contains empagliflozin and metformin. Therefore is receiving SGLT2i benefit with CAD  Cardiac risk counseling and prevention recommendations: -recommend heart healthy/Mediterranean diet, with whole grains, fruits, vegetable, fish, lean meats, nuts, and olive oil. Limit salt. -recommend moderate walking, 3-5 times/week for 30-50 minutes each session. Aim for at least 150 minutes.week. Goal should be pace of 3 miles/hours, or walking 1.5 miles in 30 minutes -recommend avoidance of tobacco products. Avoid excess alcohol.  Plan for follow up: 6 mos or sooner as needed  Medication Adjustments/Labs and Tests  Ordered: Current medicines are reviewed at length with the patient today.  Concerns regarding medicines are outlined above.   No orders of the defined types were placed in this encounter.  Meds ordered this encounter  Medications   losartan (COZAAR) 25 MG tablet    Sig: Take 0.5 tablets (12.5 mg total) by mouth daily.    Dispense:  45 tablet    Refill:  3    Replaces lisinopril   Patient Instructions  Medication Instructions:  Your physician has recommended you make the following change in your medication:  Stop: Lisinopril   Start: Losartan 12.'5mg'$  ( 1/2 tablet) daily  *If you need a refill on your cardiac medications before your next appointment, please call your pharmacy*  Follow-Up: At [facility], you and your health needs are our priority.  As part of our continuing mission to provide you with exceptional heart care, we have created designated Provider Care Teams.  These Care Teams include your primary Cardiologist (physician) and Advanced Practice Providers (APPs -  Physician Assistants and Nurse Practitioners) who all work together to provide you with the care you need, when you need it.  We recommend signing up for the patient portal called "MyChart".  Sign up information is provided on this After Visit Summary.  MyChart is used to connect with patients for Virtual Visits (Telemedicine).  Patients are able to view lab/test results, encounter notes, upcoming appointments, etc.  Non-urgent messages can be sent to your provider as well.   To learn more about what you can do with MyChart, go to [email or web address].    Your next appointment:   6 month(s)  The format for your next appointment:   In Person  Provider:   Buford Dresser, MD  I,Mathew Stumpf,acting as a Education administrator for [employer], MD.,have documented all relevant documentation on the behalf of Buford Dresser, MD,as directed by  Buford Dresser, MD while in the  presence of Buford Dresser, MD.  I, Buford Dresser, MD, have reviewed all documentation for this visit. The documentation on 06/26/22 for the exam, diagnosis, procedures, and orders are all accurate and complete.  Signed, Buford Dresser, MD PhD 06/26/2022  [place]

## 2022-06-25 ENCOUNTER — Encounter (HOSPITAL_BASED_OUTPATIENT_CLINIC_OR_DEPARTMENT_OTHER): Payer: Self-pay | Admitting: Cardiology

## 2022-06-25 ENCOUNTER — Ambulatory Visit (HOSPITAL_BASED_OUTPATIENT_CLINIC_OR_DEPARTMENT_OTHER): Payer: Medicare HMO | Admitting: Cardiology

## 2022-06-25 VITALS — BP 128/72 | HR 78 | Ht 71.0 in | Wt 176.5 lb

## 2022-06-25 DIAGNOSIS — E7800 Pure hypercholesterolemia, unspecified: Secondary | ICD-10-CM

## 2022-06-25 DIAGNOSIS — E119 Type 2 diabetes mellitus without complications: Secondary | ICD-10-CM

## 2022-06-25 DIAGNOSIS — I2510 Atherosclerotic heart disease of native coronary artery without angina pectoris: Secondary | ICD-10-CM | POA: Diagnosis not present

## 2022-06-25 DIAGNOSIS — R058 Other specified cough: Secondary | ICD-10-CM | POA: Diagnosis not present

## 2022-06-25 DIAGNOSIS — I10 Essential (primary) hypertension: Secondary | ICD-10-CM | POA: Diagnosis not present

## 2022-06-25 MED ORDER — LOSARTAN POTASSIUM 25 MG PO TABS: 12.5000 mg | ORAL_TABLET | Freq: Every day | ORAL | 3 refills | Status: DC

## 2022-06-25 NOTE — Patient Instructions (Signed)
Medication Instructions:  Your physician has recommended you make the following change in your medication:  Stop: Lisinopril   Start: Losartan 12.'5mg'$  ( 1/2 tablet) daily  *If you need a refill on your cardiac medications before your next appointment, please call your pharmacy*  Follow-Up: At [facility], you and your health needs are our priority.  As part of our continuing mission to provide you with exceptional heart care, we have created designated Provider Care Teams.  These Care Teams include your primary Cardiologist (physician) and Advanced Practice Providers (APPs -  Physician Assistants and Nurse Practitioners) who all work together to provide you with the care you need, when you need it.  We recommend signing up for the patient portal called "MyChart".  Sign up information is provided on this After Visit Summary.  MyChart is used to connect with patients for Virtual Visits (Telemedicine).  Patients are able to view lab/test results, encounter notes, upcoming appointments, etc.  Non-urgent messages can be sent to your provider as well.   To learn more about what you can do with MyChart, go to [email or web address].    Your next appointment:   6 month(s)  The format for your next appointment:   In Person  Provider:   Buford Dresser, MD

## 2022-06-26 ENCOUNTER — Encounter (HOSPITAL_BASED_OUTPATIENT_CLINIC_OR_DEPARTMENT_OTHER): Payer: Self-pay | Admitting: Cardiology

## 2022-06-28 ENCOUNTER — Encounter (HOSPITAL_BASED_OUTPATIENT_CLINIC_OR_DEPARTMENT_OTHER): Payer: Self-pay

## 2022-06-30 ENCOUNTER — Encounter: Admit: 2022-06-30 | Discharge: 2022-06-30 | Payer: MEDICARE

## 2022-07-01 ENCOUNTER — Encounter: Admit: 2022-07-01 | Discharge: 2022-07-01 | Payer: MEDICARE

## 2022-07-02 ENCOUNTER — Encounter: Admit: 2022-07-02 | Discharge: 2022-07-02 | Payer: MEDICARE

## 2022-07-11 ENCOUNTER — Other Ambulatory Visit: Payer: Self-pay | Admitting: Internal Medicine

## 2022-07-17 DIAGNOSIS — Z8546 Personal history of malignant neoplasm of prostate: Secondary | ICD-10-CM | POA: Diagnosis not present

## 2022-07-17 DIAGNOSIS — E291 Testicular hypofunction: Secondary | ICD-10-CM | POA: Diagnosis not present

## 2022-07-24 ENCOUNTER — Encounter: Admit: 2022-07-24 | Discharge: 2022-07-24 | Payer: MEDICARE

## 2022-07-24 DIAGNOSIS — M25562 Pain in left knee: Secondary | ICD-10-CM | POA: Diagnosis not present

## 2022-07-24 DIAGNOSIS — N5231 Erectile dysfunction following radical prostatectomy: Secondary | ICD-10-CM | POA: Diagnosis not present

## 2022-07-24 DIAGNOSIS — Z8546 Personal history of malignant neoplasm of prostate: Secondary | ICD-10-CM | POA: Diagnosis not present

## 2022-07-24 DIAGNOSIS — N3946 Mixed incontinence: Secondary | ICD-10-CM | POA: Diagnosis not present

## 2022-07-24 DIAGNOSIS — Z96651 Presence of right artificial knee joint: Secondary | ICD-10-CM | POA: Diagnosis not present

## 2022-07-24 DIAGNOSIS — E291 Testicular hypofunction: Secondary | ICD-10-CM | POA: Diagnosis not present

## 2022-07-24 DIAGNOSIS — M1712 Unilateral primary osteoarthritis, left knee: Secondary | ICD-10-CM | POA: Diagnosis not present

## 2022-07-24 NOTE — Progress Notes
Pharmacy Benefits Investigation    Medication name: TRULICITY 1.5 VE/9.3 ML SC PNIJ    The insurance does not require a prior authorization for the medication.    The out of pocket cost is $623.55.    Copay assistance is not available. All available forms of copay assistance were evaluated and none are currently available for Margurite Auerbach.    Contacted Jshaun Abernathy to discuss the approval and copay. Left voicemail asking patient to return call to the specialty pharmacy billing team at [phone]. Will follow up in 2 business days if patient has not returned call. Prescription will be placed on hold until patient returns call and request fill.    Upon further investigation, it was found that patient has not met their annual deductible. Patient will continue to see high copays on all brand name medications until then.     [employer] Patient Advocate

## 2022-07-29 ENCOUNTER — Encounter: Admit: 2022-07-29 | Discharge: 2022-07-29 | Payer: MEDICARE

## 2022-08-02 ENCOUNTER — Encounter: Admit: 2022-08-02 | Discharge: 2022-08-02 | Payer: MEDICARE

## 2022-08-06 DIAGNOSIS — E291 Testicular hypofunction: Secondary | ICD-10-CM | POA: Diagnosis not present

## 2022-08-07 ENCOUNTER — Encounter (HOSPITAL_BASED_OUTPATIENT_CLINIC_OR_DEPARTMENT_OTHER): Payer: Self-pay

## 2022-08-07 DIAGNOSIS — I10 Essential (primary) hypertension: Secondary | ICD-10-CM

## 2022-08-08 MED ORDER — LOSARTAN POTASSIUM 25 MG PO TABS: 25.0000 mg | ORAL_TABLET | Freq: Every day | ORAL | 3 refills | Status: DC

## 2022-08-13 ENCOUNTER — Encounter: Admit: 2022-08-13 | Discharge: 2022-08-13 | Payer: MEDICARE

## 2022-09-04 ENCOUNTER — Other Ambulatory Visit: Payer: Self-pay | Admitting: Urology

## 2022-09-04 ENCOUNTER — Encounter: Admit: 2022-09-04 | Discharge: 2022-09-04 | Payer: MEDICARE

## 2022-09-09 ENCOUNTER — Encounter: Admit: 2022-09-09 | Discharge: 2022-09-09 | Payer: MEDICARE

## 2022-09-10 ENCOUNTER — Encounter: Admit: 2022-09-10 | Discharge: 2022-09-10 | Payer: MEDICARE

## 2022-09-12 ENCOUNTER — Encounter: Admit: 2022-09-12 | Discharge: 2022-09-12 | Payer: MEDICARE

## 2022-09-13 ENCOUNTER — Other Ambulatory Visit: Payer: Self-pay | Admitting: Internal Medicine

## 2022-09-13 ENCOUNTER — Encounter: Admit: 2022-09-13 | Discharge: 2022-09-13 | Payer: MEDICARE

## 2022-09-13 DIAGNOSIS — E7800 Pure hypercholesterolemia, unspecified: Secondary | ICD-10-CM

## 2022-09-13 DIAGNOSIS — M25521 Pain in right elbow: Secondary | ICD-10-CM | POA: Diagnosis not present

## 2022-09-14 ENCOUNTER — Encounter: Admit: 2022-09-14 | Discharge: 2022-09-14 | Payer: MEDICARE

## 2022-09-15 ENCOUNTER — Encounter: Admit: 2022-09-15 | Discharge: 2022-09-15 | Payer: MEDICARE

## 2022-09-16 ENCOUNTER — Encounter: Admit: 2022-09-16 | Discharge: 2022-09-16 | Payer: MEDICARE

## 2022-10-08 ENCOUNTER — Encounter: Admit: 2022-10-08 | Discharge: 2022-10-08 | Payer: MEDICARE

## 2022-10-09 ENCOUNTER — Encounter: Admit: 2022-10-09 | Discharge: 2022-10-09 | Payer: MEDICARE

## 2022-10-19 ENCOUNTER — Other Ambulatory Visit: Payer: Self-pay | Admitting: Gastroenterology

## 2022-10-24 ENCOUNTER — Other Ambulatory Visit: Payer: Self-pay | Admitting: Internal Medicine

## 2022-10-24 DIAGNOSIS — E7800 Pure hypercholesterolemia, unspecified: Secondary | ICD-10-CM

## 2022-10-25 ENCOUNTER — Other Ambulatory Visit: Payer: Self-pay | Admitting: Internal Medicine

## 2022-10-25 DIAGNOSIS — E7800 Pure hypercholesterolemia, unspecified: Secondary | ICD-10-CM

## 2022-11-05 ENCOUNTER — Encounter: Admit: 2022-11-05 | Discharge: 2022-11-05 | Payer: MEDICARE

## 2022-11-06 ENCOUNTER — Encounter: Payer: Self-pay | Admitting: Gastroenterology

## 2022-11-06 ENCOUNTER — Ambulatory Visit: Payer: Medicare HMO | Admitting: Gastroenterology

## 2022-11-06 VITALS — BP 122/74 | HR 77 | Ht 71.0 in | Wt 181.0 lb

## 2022-11-06 DIAGNOSIS — K219 Gastro-esophageal reflux disease without esophagitis: Secondary | ICD-10-CM | POA: Diagnosis not present

## 2022-11-06 MED ORDER — ESOMEPRAZOLE MAGNESIUM 40 MG PO CPDR: DELAYED_RELEASE_CAPSULE | ORAL | 3 refills | Status: DC

## 2022-11-06 NOTE — Progress Notes (Signed)
    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 Screening colonoscopy recommended in July 2028 REV in 1 year   HPI    This is a 67 year old male with GERD and intermittent abdominal cramping.  His reflux symptoms are generally well-controlled unless he eats late at night or has certain foods that exacerbate symptoms.  He takes Pepcid [employer] as needed for breakthrough symptoms.  He relates intermittent mild abdominal cramping and borborygmi which is controlled with dicyclomine.  He complains of urgent diarrhea following metformin doses.  EGD 08/2020 - Benign-appearing, non-obstructing esophageal stenosis. - Erythematous mucosa in the gastric body and antrum. Biopsied. - Small hiatal hernia. - Normal duodenal bulb and second portion of the duodenum.  Colonoscopy July 2018 - A 5 mm polyp was found in the descending colon. The polyp was sessile. Resection and retrieval were complete. (Hyperplastic) - Multiple small-mouthed diverticula in the left colon.                           - Internal hemorrhoids    Labs / Imaging       Latest Ref Rng & Units 12/26/2021    9:12 AM 06/22/2020   10:52 AM 01/27/2020   11:10 AM  Hepatic Function  Total Protein 6.0 - 8.3 g/dL 6.8  7.1  6.4   Albumin 3.5 - 5.2 g/dL 4.5   4.0   AST 0 - 37 U/L ALT 0 - 53 U/L Alk Phosphatase 39 - 117 U/L 97   75   Total Bilirubin 0.2 - 1.2 mg/dL 0.7  1.2  0.8        Latest Ref Rng & Units 12/26/2021    9:12 AM 09/20/2020   10:33 AM 06/22/2020   10:52 AM  CBC  WBC 4.0 - 10.5 K/uL  4.3  5.2  5.9   Hemoglobin 13.0 - 17.0 g/dL 16.1  09.6  04.5   Hematocrit 39.0 - 52.0 % 44.2  45.1  46.3   Platelets 150.0 - 400.0 K/uL 164.0  180  170    Current Medications, Allergies, Past Medical History, Past Surgical History, Family History and Social History were reviewed in [employer] record.   Physical Exam: General: Well developed, well nourished, no acute distress Head: Normocephalic and atraumatic Eyes: Sclerae anicteric, EOMI Ears: Normal auditory acuity Mouth: No deformities or lesions noted Lungs: Clear throughout to auscultation Heart: Regular rate and rhythm; No murmurs, rubs or bruits Abdomen: Soft, non tender and non distended. No masses, hepatosplenomegaly or hernias noted. Normal Bowel sounds Rectal: Not done Musculoskeletal: Symmetrical with no gross deformities  Pulses:  Normal pulses noted Extremities: No edema or deformities noted Neurological: Alert oriented x 4, grossly nonfocal Psychological:  Alert and cooperative. Normal mood and affect   Deserae Jennings T. Russella Dar, MD 11/06/2022, 3:04 PM

## 2022-11-06 NOTE — Patient Instructions (Signed)
We have sent the following medications to your pharmacy for you to pick up at your convenience: esomeprazole.   The [facility] providers would like to encourage you to use [facility] to communicate with providers for non-urgent requests or questions.  Due to long hold times on the telephone, sending your provider a message by [facility] may be a faster and more efficient way to get a response.  Please allow 48 business hours for a response.  Please remember that this is for non-urgent requests.   Thank you for choosing me and [facility].  Venita Lick. Pleas Koch., MD., Clementeen Graham

## 2022-11-12 ENCOUNTER — Encounter: Admit: 2022-11-12 | Discharge: 2022-11-12 | Payer: MEDICARE

## 2022-11-20 ENCOUNTER — Ambulatory Visit: Admit: 2022-11-20 | Discharge: 2022-11-20 | Payer: MEDICARE

## 2022-11-20 ENCOUNTER — Encounter: Admit: 2022-11-20 | Discharge: 2022-11-20 | Payer: MEDICARE

## 2022-11-20 DIAGNOSIS — K219 Gastro-esophageal reflux disease without esophagitis: Secondary | ICD-10-CM

## 2022-11-20 DIAGNOSIS — I4510 Unspecified right bundle-branch block: Secondary | ICD-10-CM

## 2022-11-20 DIAGNOSIS — R002 Palpitations: Secondary | ICD-10-CM

## 2022-11-20 DIAGNOSIS — R0989 Other specified symptoms and signs involving the circulatory and respiratory systems: Secondary | ICD-10-CM

## 2022-11-20 DIAGNOSIS — Z9889 Other specified postprocedural states: Secondary | ICD-10-CM

## 2022-11-20 DIAGNOSIS — I4891 Unspecified atrial fibrillation: Secondary | ICD-10-CM

## 2022-11-20 DIAGNOSIS — S4991XA Unspecified injury of right shoulder and upper arm, initial encounter: Secondary | ICD-10-CM

## 2022-11-20 DIAGNOSIS — I480 Paroxysmal atrial fibrillation: Secondary | ICD-10-CM

## 2022-11-20 DIAGNOSIS — I10 Essential (primary) hypertension: Secondary | ICD-10-CM

## 2022-11-20 DIAGNOSIS — G4733 Obstructive sleep apnea (adult) (pediatric): Secondary | ICD-10-CM

## 2022-11-20 DIAGNOSIS — R112 Nausea with vomiting, unspecified: Secondary | ICD-10-CM

## 2022-11-20 DIAGNOSIS — Z95818 Presence of other cardiac implants and grafts: Secondary | ICD-10-CM

## 2022-11-20 NOTE — Progress Notes
Pt came to have TEE for post watchman today. Pt took his Trulicity on Sunday. He was not told to hold a week prior to procedure. Pt very understanding of the risks of being put on sedation since it has only been a few days since he took Trulicity. Pt and wife ok to reschedule for Monday 5/13 at 9:30. Printed a reminder with all instructions and pt to hold trulicity this Sunday 5/12. Pt verbalized all understanding to all instructions given.

## 2022-11-20 NOTE — Patient Instructions
Follow-Up:      -We would like you to follow up in   1 years with Dr. Lorain Childes or Craig Guess in [place]   The schedule is released approximately 4-5 months in advance. You should be called or mailed to make an appointment, however if you would like to call us to make this appt, please call [phone].    -You will receive a survey in the upcoming week from [facility] [place] [facility]. Your feedback is important to [place], and helps [place] continue to improve patient care and patient satisfaction.     Changes From Today's Office Visit   Post Watchman TEE today.    Contacting our office:    -Business Hours: Monday-Friday, 8:00 am-4:30 pm (excluding Holidays).     -For medical questions or concerns, please send [place] a message through your MyChart account or call the [facility] nursing triage line at [phone]. Please leave a detailed message with your name, date of birth, and reason for your call.  If your message is received before 3:30pm, every effort will be made to call you back the same day.  Please allow time for [place] to review your chart prior to call back.     -For medication refills please start by contacting your pharmacy. You can also send [place] a prescription question through your MyChart or call the nurse triage line above.     -Should you have an immediate need of the weekend/nights and holidays, please call our on-call triage line at [phone].    -Our fax number is [phone].    Results & Testing Follow Up:    -Please allow 10-15 business days for the results of any testing to be reviewed. Please call our office if you have not heard from a nurse within this time frame.    -Should you choose to complete testing at an outside facility, please contact our office after completion of testing so that we can ensure that we have received results.    Lab and test results:  As a part of the CARES act, starting 10/14/2019, some results will be released to you via mychart immediately and automatically.  You may see results before your provider sees them; however, your provider will review all these results and then they, or one of their team, will notify you of result information and recommendations.   Critical results will be addressed immediately, but otherwise, please allow [place] time to get back with you prior to you reaching out to [place] for questions.  This will usually take about 72 hours for labs and 5-7 days for procedure test results.      We know you have a choice and want to thank you for choosing [facility]

## 2022-11-22 ENCOUNTER — Other Ambulatory Visit: Payer: Self-pay | Admitting: Internal Medicine

## 2022-11-22 DIAGNOSIS — E119 Type 2 diabetes mellitus without complications: Secondary | ICD-10-CM

## 2022-11-25 ENCOUNTER — Ambulatory Visit: Admit: 2022-11-25 | Discharge: 2022-11-25 | Payer: MEDICARE

## 2022-11-25 ENCOUNTER — Encounter: Admit: 2022-11-25 | Discharge: 2022-11-25 | Payer: MEDICARE

## 2022-11-25 LAB — POC GLUCOSE: POC GLUCOSE: 161 mg/dL — ABNORMAL HIGH (ref 70–100)

## 2022-11-25 MED ORDER — SODIUM CHLORIDE 0.9 % IV SOLP (OR) 500ML
INTRAVENOUS | 0 refills | Status: DC
Start: 2022-11-25 — End: 2022-11-25

## 2022-11-25 MED ORDER — PROPOFOL 10 MG/ML IV EMUL 20 ML (INFUSION)(AM)(OR)
INTRAVENOUS | 0 refills | Status: DC
Start: 2022-11-25 — End: 2022-11-25

## 2022-11-25 MED ORDER — PROPOFOL INJ 10 MG/ML IV VIAL
INTRAVENOUS | 0 refills | Status: DC
Start: 2022-11-25 — End: 2022-11-25

## 2022-11-25 MED ORDER — LIDOCAINE (PF) 20 MG/ML (2 %) IJ SOLN
INTRAVENOUS | 0 refills | Status: DC
Start: 2022-11-25 — End: 2022-11-25

## 2022-11-25 NOTE — Anesthesia Post-Procedure Evaluation
Post-Anesthesia Evaluation    Name: Tommy Miller      MRN: [id number]     DOB: April 11, 1956     Age: 67 y.o.     Sex: male   __________________________________________________________________________     Procedure Information       Anesthesia Start Date/Time: 11/25/22 1008    Scheduled providers: Dan Europe, MD; Pincus Badder, RN    Procedure: TRANSESOPHAGEAL ECHO    Location: Cardiovascular Medicine: [facility]            Post-Anesthesia Vitals  BP: 129/71 (05/13 0929)  Pulse: 62 (05/13 0929)  Respirations: 16 PER MINUTE (05/13 0929)  SpO2: 95 % (05/13 0929)  O2 Device: None (Room air) (05/13 0929) No vitals data found for the desired time range.        Post Anesthesia Evaluation Note      Patient participation: recovered; patient participated in evaluation  Level of consciousness: sleepy but conscious    Pain score: 0  Pain management: adequate    Hydration: normovolemia  Airway patency: adequate    Perioperative Events      Postoperative Status  Cardiovascular status: hemodynamically stable  Respiratory status: spontaneous ventilation        Perioperative Events  There were no known complications for this encounter.

## 2022-11-25 NOTE — Progress Notes
Care Plan   Care Category & Patient Outcome Goal Met Treatment/  Interventions Plan of the Day RN Name   Cardiovascular  Hemodynamic stability and adequate peripheral perfusion.   Yes   Refer to  patient's chart. Monitor VS per sedation standard. Monitor ECG continuously.  Assess/maintain IV patency.   Oswald Pott, RN   Respiratory  Patent airway, ease of respiration, and adequate oxygenation.   Yes   Refer to  patient's chart. Maintain open airway. Assess respirations. Monitor O2 saturations. Titrate O2 to keep sat>= 95% or baseline.   Quentyn Kolbeck, RN   Psychological/Emotional/Spiritual  Cope with procedure with support in place.  Spiritual needs are addressed.     Yes   Refer to  patient's chart. Provide adequate and thorough instructions.  Provide a caring and supportive environment.  Communicate patients concerns with other members of the health team.   Markela Wee, RN   Pain  Patients pain goal met.   Yes   Refer to  patient's chart. Prepare patient for potentially uncomfortable procedure.  Observe for verbal/nonverbal complaints of pain.  Assess pain.  Provide comfort measures.   Rogelio Waynick, RN   Safety/Fall Risk  Free from injury, security maintained.   Yes High Risk    Refer to  patient's chart.   Follow nursing standard of practice for high risks fall patients.   Shantele Reller, RN   Knowledge Base  Verbalize understanding of procedure/information provided.   Yes   Refer to  patient's chart. Describe the procedure along with what symptoms to expect.  Evaluate patients understanding of procedure.  Encourage patient to ask questions.  Provide additional information as needed.   Lindsea Olivar, RN

## 2022-11-25 NOTE — Progress Notes
Pre-Operative Assessment for TEE or Cardioversion    Date of Service:  11/25/2022    Tommy Miller is a 67 y.o. y.o. male. With significant cardiac history who is referred for TEE Indication: Post watchman - one yr-11/2021 # 31 mm .      he has been compliant with his  aspirin 81mg  Missed dose: Noand No bleeding. he is Positive for: GERD and Hx. Chest surgery/radiation and Positive for: COPD / Asthma, Sleep Apnea/OSA, CPAP, and ETOH social drinker.      GI procedures:EGD and Colonoscopy            When: When/Date: 6 yrs ago for EGD    Chest pain:  No   SOB: No         Medical History:  Medical History:   Diagnosis Date    A-fib (HCC)     GERD (gastroesophageal reflux disease)     HTN (hypertension) 12/26/2020    Obstructive sleep apnea hypopnea, severe     OSA (obstructive sleep apnea) 12/26/2020    Palpitations 12/26/2020    PONV (postoperative nausea and vomiting)     during EGD    Right shoulder injury         Surgical History:   Surgical History:   Procedure Laterality Date    COLONOSCOPY  04/11/2021    Tommy Miller no recall date    INTRACARDIAC CATHETER ABLATION WITH COMPREHENSIVE ELECTROPHYSIOLOGIC EVALUATION - ATRIAL FIBRILLATION N/A 11/12/2021    Performed by Lorain Childes, MD at [facility] EP LAB    PERCUTANEOUS CLOSURE LEFT ATRIAL APPENDAGE WITH ENDOCARDIAL IMPLANT, TRANSEPTAL CATHETERIZATION, FOLEY, CONTRAST ANTICIPATED N/A 11/12/2021    Performed by Reola Mosher, MD at [facility] EP LAB    TRANSESOPHAGEAL ECHOCARDIOGRAM DURING INTERVENTION N/A 11/12/2021    Performed by Vena Austria, MD at [facility] EP LAB    CARDIOVASCULAR STRESS TEST      ELECTROCARDIOGRAM      EVENT MONITOR      HX TONSILLECTOMY      as a chld    SHOULDER SURGERY Right     Shoulder repair by Dr. Glenetta Hew.        Social History     Social History     Tobacco Use    Smoking status: Never     Passive exposure: Never    Smokeless tobacco: Never   Vaping Use    Vaping status: Never Used   Substance Use Topics    Alcohol use: Yes     Comment: maybe 1 beer a month, rare    Drug use: Never         Allergies                                        No Known Allergies       Current Medications  Current Outpatient Medications on File Prior to Encounter   Medication Sig Dispense Refill    ascorbic acid (vitamin C) 500 mg tablet Take one tablet by mouth daily.      aspirin EC 81 mg tablet Take one tablet by mouth daily. Take with food. 90 tablet 11    cetirizine (ZYRTEC) 10 mg tablet Take one tablet by mouth at bedtime daily.      CHOLEcalciferoL (vitamin D3) (VITAMIN D3) 1,000 units tablet Take 1 capsule in the morning and 2 capsules at bedtime  dulaglutide (TRULICITY) 0.75 mg/0.5 mL injection pen Inject 0.5 mL under the skin every 7 days. 2 mL 3    fish oil /omega-3 fatty acids (SEA-OMEGA) 340/1000 mg capsule Take four capsules by mouth twice daily. 1400 mg each capsule      fluticasone propionate (FLONASE) 50 mcg/actuation nasal spray, suspension Apply two sprays to each nostril as directed daily. Shake bottle gently before using. 16 g 4    losartan-hydroCHLOROthiazide (HYZAAR) 50-12.5 mg tablet Take one tablet by mouth twice daily. 180 tablet 1    MAGNESIUM GLYCINATE PO Take 200 mg by mouth twice daily.      metFORMIN (GLUCOPHAGE) 500 mg tablet Take one tablet by mouth twice daily after meals. 120 tablet 1    metoprolol succinate XL (TOPROL XL) 100 mg extended release tablet TAKE 1 TABLET BY MOUTH TWICE A DAY 180 tablet 1    metroNIDAZOLE (METROGEL) 1 % topical gel Apply one g topically to affected area twice daily. 60 g 11    omeprazole DR (PRILOSEC) 20 mg capsule Take one capsule by mouth daily before breakfast. 90 capsule 1    rosuvastatin (CRESTOR) 20 mg tablet TAKE 1 TABLET BY MOUTH DAILY 60 tablet 1    vitamin E 400 unit capsule Take one capsule by mouth daily.       No current facility-administered medications on file prior to encounter.       Vitals  Estimated body mass index is 33.75 kg/m? as calculated from the following:    Height as of this encounter: 180.3 cm (5' 11).    Weight as of this encounter: 109.8 kg (242 lb).       Patient appears alert and oriented: Yes  NPO: for greater than 8 hours  Inpatient IV status:  PIV started per right wrist #22 x 1 attempt with NS flush and NS flush    Diagnostic Tests  White Blood Cells   Date Value Ref Range Status   11/05/2022 6.0 4.5 - 11.0 K/UL Final     Hemoglobin   Date Value Ref Range Status   11/05/2022 15.0 13.5 - 16.5 GM/DL Final     Hematocrit   Date Value Ref Range Status   11/05/2022 42.9 40 - 50 % Final     Platelet Count   Date Value Ref Range Status   11/05/2022 175 150 - 400 K/UL Final     Sodium   Date Value Ref Range Status   11/05/2022 145 137 - 147 MMOL/L Final     Potassium   Date Value Ref Range Status   11/05/2022 3.5 3.5 - 5.1 MMOL/L Final     Magnesium   Date Value Ref Range Status   10/31/2021 2.2 1.6 - 2.6 mg/dL Final     Blood Urea Nitrogen   Date Value Ref Range Status   11/05/2022 17 7 - 25 MG/DL Final     Creatinine   Date Value Ref Range Status   11/05/2022 0.97 0.4 - 1.24 MG/DL Final     Glucose   Date Value Ref Range Status   11/05/2022 156 (H) 70 - 100 MG/DL Final       Last MAC INR Flow Sheet Entry:    Last recorded Lab results:   INR POC   Date Value Ref Range Status   11/12/2021 1.1 0.8 - 1.2 Final     No results found for: PTT        Blood Cultures  Resulted Micro Last 72 Hrs  No results found         Last TEE date:  11/12/21  Last Cardioversion date: None  Echo procedures within the past 30 days:  No results found.      Device Information on File  No results found for: GENERATOR, EPDEVTYP      Additional Comments:  Ablation 11/2021    Plan:  Dr. Arna Medici / Maisie Fus will plan to proceed with the  TEE.

## 2022-12-17 DIAGNOSIS — M25521 Pain in right elbow: Secondary | ICD-10-CM | POA: Diagnosis not present

## 2022-12-26 ENCOUNTER — Ambulatory Visit (HOSPITAL_BASED_OUTPATIENT_CLINIC_OR_DEPARTMENT_OTHER): Payer: Medicare HMO | Admitting: Cardiology

## 2022-12-26 ENCOUNTER — Encounter (HOSPITAL_BASED_OUTPATIENT_CLINIC_OR_DEPARTMENT_OTHER): Payer: Self-pay | Admitting: Cardiology

## 2022-12-26 VITALS — BP 138/78 | HR 74 | Ht 71.5 in | Wt 177.0 lb

## 2022-12-26 DIAGNOSIS — I493 Ventricular premature depolarization: Secondary | ICD-10-CM

## 2022-12-26 DIAGNOSIS — I2510 Atherosclerotic heart disease of native coronary artery without angina pectoris: Secondary | ICD-10-CM

## 2022-12-26 DIAGNOSIS — E119 Type 2 diabetes mellitus without complications: Secondary | ICD-10-CM | POA: Diagnosis not present

## 2022-12-26 DIAGNOSIS — E7800 Pure hypercholesterolemia, unspecified: Secondary | ICD-10-CM | POA: Diagnosis not present

## 2022-12-26 DIAGNOSIS — Z7984 Long term (current) use of oral hypoglycemic drugs: Secondary | ICD-10-CM

## 2022-12-26 DIAGNOSIS — I10 Essential (primary) hypertension: Secondary | ICD-10-CM | POA: Diagnosis not present

## 2022-12-26 DIAGNOSIS — E785 Hyperlipidemia, unspecified: Secondary | ICD-10-CM | POA: Diagnosis not present

## 2022-12-26 NOTE — Progress Notes (Signed)
Cardiology Office Note:    Date:  12/26/2022   ID:  Jeff Jenkins, DOB October 25, 1955, MRN [id number]  PCP:  Jeff Jenkins, Jeff Patricia, MD  Cardiologist:  Jeff Red, MD  Referring MD: Jeff Jenkins, Estel*   CC: Follow-up  History of Present Illness:    Jeff Jenkins is a 67 y.o. male with a hx of significant coronary calcification, nonobstructive CAD, family history of heart disease, type II diabetes lifestyle controlled who is seen for follow up today. He was initially seen 04/04/19 as a new consult at the request of Jeff Jenkins, Jeff Bar* for the evaluation and management of elevated cardiovascular risk.  CV history/risk factors: Strong family history: lost mother to his heart attack suddenly at age 69 (had evidence of old MI that was unknown). Lost his brother to a massive stroke at age 33.   Today, he is accompanied by his wife. Generally he has been feeling well. However, he complains of ongoing low energy levels that he is not certain if related to his age or his heart. He remains asymptomatic regarding his known PVC's.  While in the shower, he will lean back while rinsing his shampoo causing him to become mildly dizzy, "like it's moving". He denies near-syncope or syncope.  He affirms that his cough resolved after he was switched to losartan. In clinic today his blood pressure is 140/82. He states that he had 1 cup of decaf coffee and took Allegra (not Allegra-D) earlier today. Also had a recent home reading of 133/70s with a heart rate in the 40s. On manual recheck, his blood pressure improved to 138/78.  Currently he is back on Synjardy as he was in the donut hole and his other medications became cost prohibitive. He notes that he seems to tolerate Synjardy better.  Lately he remains active without exertional symptoms. He walks routinely, helps with mowing grass, planting in the large garden, and will start working out at [employer]. He has completed his  orientation for learning the exercise machines.  He denies any palpitations, chest pain, shortness of breath, peripheral edema, headaches, syncope, orthopnea, or PND.  Past Medical History:  Diagnosis Date   Allergy    Arthritis    Broken ankle 2008   Coronary artery disease    Diverticulosis    ED (erectile dysfunction)    Facial fracture (HCC) 1979   GERD (gastroesophageal reflux disease)    Hearing loss in right ear    Hemorrhoids    Hiatal hernia    Hyperlipidemia    Hypertension    Peptic stricture of esophagus    Peptic ulcer disease 1989   Prostate cancer (HCC) 02/2009   Seasonal allergies    Type 2 diabetes mellitus (HCC)     Past Surgical History:  Procedure Laterality Date   ANKLE SURGERY Left 2008   CHOLECYSTECTOMY N/A 01/31/2020   Procedure: LAPAROSCOPIC CHOLECYSTECTOMY WITH INTRAOPERATIVE CHOLANGIOGRAM;  Surgeon: Berna Bue, MD;  Location: [facility];  Service: General;  Laterality: N/A;   FACIAL FRACTURE SURGERY     HERNIA REPAIR  09/26/2021   Sugery on 4 hernias in groin area  [facility]   INGUINAL HERNIA REPAIR Left    KNEE ARTHROSCOPY Left 2013   KNEE ARTHROSCOPY  07/03/2020   [facility]   PROSTATECTOMY  2011   TONSILLECTOMY     UPPER GASTROINTESTINAL ENDOSCOPY     VASECTOMY      Current Medications: Current Outpatient Medications on File Prior to Visit  Medication Sig   acetaminophen (TYLENOL) 325 MG tablet Take 650 mg by mouth every 6 (six) hours as needed for moderate pain.   aspirin EC 81 MG tablet Take 81 mg by mouth daily. Swallow whole.   atorvastatin (LIPITOR) 40 MG tablet TAKE 1 TABLET BY MOUTH EVERY DAY   azelastine (ASTELIN) 0.1 % nasal spray Place 1 spray into both nostrils 2 (two) times daily. Use in each nostril as directed   Calcium Carbonate Antacid (TUMS PO) Take 2 tablets by mouth as needed.   diclofenac (VOLTAREN) 75 MG EC tablet Take 75 mg by mouth 2 (two) times daily.   dicyclomine (BENTYL) 10 MG capsule TAKE  1 CAPSULE (10 MG TOTAL) BY MOUTH 3 (THREE) TIMES DAILY AS NEEDED FOR SPASMS.   empagliflozin (JARDIANCE) 25 MG TABS tablet Take 1 tablet (25 mg total) by mouth daily before breakfast.   esomeprazole (NEXIUM) 40 MG capsule TAKE 1 CAPSULE BY MOUTH DAILY BEFORE BREAKFAST.   ezetimibe (ZETIA) 10 MG tablet TAKE 1 TABLET BY MOUTH DAILY   fexofenadine (ALLEGRA) 180 MG tablet Take 180 mg by mouth daily.   Lancets (ONETOUCH ULTRASOFT) lancets 1 each by Other route daily. Dx E11.9   losartan (COZAAR) 25 MG tablet Take 1 tablet (25 mg total) by mouth daily.   metformin (FORTAMET) 1000 MG (OSM) 24 hr tablet Take 1,000 mg by mouth daily with breakfast.   ONETOUCH ULTRA test strip CHECK BLOOD SUGAR DAILY   sildenafil (VIAGRA) 100 MG tablet TAKE 1 TABLET BY MOUTH DAILY AS NEEDED   SYNJARDY XR 25-1000 MG TB24 TAKE 1 TABLET BY MOUTH EVERY DAY   tadalafil (CIALIS) 5 MG tablet Take 5 mg by mouth daily as needed for erectile dysfunction.    Testosterone 30 MG/ACT SOLN Place onto the skin daily.   No current facility-administered medications on file prior to visit.     Allergies:   Patient has no known allergies.   Social History   Tobacco Use   Smoking status: Never   Smokeless tobacco: Never  Vaping Use   Vaping Use: Never used  Substance Use Topics   Alcohol use: Yes    Alcohol/week: 12.0 standard drinks of alcohol    Types: 12 Standard drinks or equivalent per week    Comment: social    Drug use: No    Family History: The patient's family history includes Alcohol abuse in his father; Breast cancer in his mother; Diabetes in his mother; Heart attack in his mother; Stroke in his brother. There is no history of Colon cancer, Esophageal cancer, Rectal cancer, or Stomach cancer.  ROS:   Please see the history of present illness.   (+) Fatigue (+) Positional dizziness Additional pertinent ROS otherwise negative  EKGs/Labs/Other Studies Reviewed:    The following studies were reviewed  today:  MRA Head  05/12/2022: IMPRESSION: Negative intracranial MRA.  Cardiac CT 05-18-19 Aorta: Mildly dilated aortic root (42 mm). Minimal calcifications. No dissection.   Aortic Valve:  Trileaflet.  No calcifications.  Coronary Arteries:  Normal coronary origin.  Right dominance.   RCA is a large dominant artery that gives rise to PDA and PLVB. There is diffuse minimal calcified plaque (0-24%); mild calcified plaque (25-49%) at takeoff of PDA. Minimal plaque (0-24%) in posterolateral.   Left main is a large artery that gives rise to LAD and LCX arteries. There is no plaque.   LAD is a large vessel that has 2 diagonals. There is calcified plaque in the proximal (50-69%) and  mid vessel (0-24%); there is mild calcified plaque (25-49%) in proximal D1.   LCX is a non-dominant artery that gives rise to small OM1 and OM2. There is minimal calcified plaque (0-24%) noted.   Other findings:  Normal pulmonary vein drainage into the left atrium.  Normal let atrial appendage without a thrombus.  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   IMPRESSION: 1. Coronary calcium score of 2319. This was 39 percentile for age and sex matched control.   2. Normal coronary origin with right dominance.   3. Diffuse nonobstructive 3 vessel calcified plaque; 50-69% stenosis in proximal LAD; 25-49% stenosis proximal D1 and in RCA at takeoff of PDA. CADRADS-3.  Monitor report 04/2019 14 days of data recorded on Zio monitor. Patient had a min HR of 48 bpm, max HR of 184 bpm, and avg HR of 86 bpm. Predominant underlying rhythm was Sinus Rhythm. No VT, SVT, atrial fibrillation, high degree block, or pauses noted. Isolated atrial ectopy was rare (<1%). Isolated ventricular ectopy was occasional (2.6%). There was a single 4 beat run of SVT and a single 4 beat run of PVCs.  Longest ventricular bigeminy was 23 seconds, and longest ventricular trigeminy episode was 38 seconds. There were 0 triggered events.    EKG:   EKG is personally reviewed.  12/26/2022:  NSR at 74 bpm 06/25/2022: not ordered 06/22/2021: Sinus rhythm. Rate 79 bpm. 12/12/20: SR with PVC couplet, HR 93 bpm 03/25/19: SR with PVCs--two morphologies, and 2 PVCs are consecutive  Recent Labs: No results found for requested labs within last 365 days.   Recent Lipid Panel    Component Value Date/Time   CHOL 124 12/26/2021 0912   CHOL 170 08/23/2019 1221   TRIG 46.0 12/26/2021 0912   HDL 59.40 12/26/2021 0912   HDL 66 08/23/2019 1221   CHOLHDL 2 12/26/2021 0912   VLDL 9.2 12/26/2021 0912   LDLCALC 56 12/26/2021 0912   LDLCALC 53 06/22/2020 1052    Physical Exam:    VS:  BP 138/78 (BP Location: Right Arm, Patient Position: Sitting, Cuff Size: Normal)   Pulse 74   Ht 5' 11.5" (1.816 m)   Wt 177 lb (80.3 kg)   BMI 24.34 kg/m     Wt Readings from Last 3 Encounters:  12/26/22 177 lb (80.3 kg)  11/06/22 181 lb (82.1 kg)  06/25/22 176 lb 8 oz (80.1 kg)   GEN: Well nourished, well developed in no acute distress HEENT: Normal, moist mucous membranes NECK: No JVD CARDIAC: regular rhythm with one of his occasional PVC's, normal S1 and S2, no rubs or gallops. No murmur. VASCULAR: Radial and DP pulses 2+ bilaterally. No carotid bruits RESPIRATORY:  Clear bilaterally without rales, or rhonchi.  ABDOMEN: Soft, non-tender, non-distended MUSCULOSKELETAL:  Ambulates independently.  SKIN: Warm and dry, no edema NEUROLOGIC:  Alert and oriented x 3. No focal neuro deficits noted. PSYCHIATRIC:  Normal affect     ASSESSMENT:    1. Nonocclusive coronary atherosclerosis of native coronary artery   2. Pure hypercholesterolemia   3. Type 2 diabetes mellitus without obesity (HCC)   4. Hyperlipidemia LDL goal <70   5. Essential hypertension   6. PVC's (premature ventricular contractions)     PLAN:    Nonobstructive CAD Significant coronary calcification -while no plaque with >70% stenosis suggest that chest pain is not ischemic in  origin, his very high calcium score and mixed/lipid rich plaque in proximal LAD is concerning. Has diffuse calcified plaue in 3 vessels as well  as focal mixed/lipid rich plaque -continue aspirin 81 mg  -tolerating statin, as below  Hypercholesterolemia: goal LDL <70 given CAD -he is tolerating atorvastatin 40 mg daily, continue -on ezetimibe 10 mg daily as well -continuing to work on lifestyle  Erectile dysfunction: see prior discussion re: PDE5i  PVCs: burden <3%.  -currently asymptomatic, but if worsens consider echo in the future. Have discussed beta blockers, declines currently. Monitor for symptoms.   Type II diabetes without obesity: BMI 24 -on synjardy, which contains empagliflozin and metformin. Therefore is receiving SGLT2i benefit with CAD  Cardiac risk counseling and prevention recommendations: -recommend heart healthy/Mediterranean diet, with whole grains, fruits, vegetable, fish, lean meats, nuts, and olive oil. Limit salt. -recommend moderate walking, 3-5 times/week for 30-50 minutes each session. Aim for at least 150 minutes.week. Goal should be pace of 3 miles/hours, or walking 1.5 miles in 30 minutes -recommend avoidance of tobacco products. Avoid excess alcohol.  Plan for follow up: 1 year or sooner as needed.  Medication Adjustments/Labs and Tests Ordered: Current medicines are reviewed at length with the patient today.  Concerns regarding medicines are outlined above.   Orders Placed This Encounter  Procedures   EKG 12-Lead   No orders of the defined types were placed in this encounter.  Patient Instructions  Medication Instructions:  Your physician recommends that you continue on your current medications as directed. Please refer to the Current Medication list given to you today.  *If you need a refill on your cardiac medications before your next appointment, please call your pharmacy*  Lab Work: NONE  Testing/Procedures: NONE  Follow-Up: At [facility], you and your health needs are our priority.  As part of our continuing mission to provide you with exceptional heart care, we have created designated Provider Care Teams.  These Care Teams include your primary Cardiologist (physician) and Advanced Practice Providers (APPs -  Physician Assistants and Nurse Practitioners) who all work together to provide you with the care you need, when you need it.  We recommend signing up for the patient portal called "MyChart".  Sign up information is provided on this After Visit Summary.  MyChart is used to connect with patients for Virtual Visits (Telemedicine).  Patients are able to view lab/test results, encounter notes, upcoming appointments, etc.  Non-urgent messages can be sent to your provider as well.   To learn more about what you can do with MyChart, go to [email or web address].    Your next appointment:   12 month(s)  The format for your next appointment:   In Person  Provider:   Jodelle Red, MD       [facility],acting as a scribe for Jeff Red, MD.,have documented all relevant documentation on the behalf of Jeff Red, MD,as directed by  Jeff Red, MD while in the presence of Jeff Red, MD.  I, Jeff Red, MD, have reviewed all documentation for this visit. The documentation on 12/26/22 for the exam, diagnosis, procedures, and orders are all accurate and complete.  Signed, Jeff Red, MD PhD 12/26/2022  [facility]

## 2022-12-26 NOTE — Patient Instructions (Signed)
Medication Instructions:  Your physician recommends that you continue on your current medications as directed. Please refer to the Current Medication list given to you today.  *If you need a refill on your cardiac medications before your next appointment, please call your pharmacy*  Lab Work: NONE  Testing/Procedures: NONE  Follow-Up: At [facility], you and your health needs are our priority.  As part of our continuing mission to provide you with exceptional heart care, we have created designated Provider Care Teams.  These Care Teams include your primary Cardiologist (physician) and Advanced Practice Providers (APPs -  Physician Assistants and Nurse Practitioners) who all work together to provide you with the care you need, when you need it.  We recommend signing up for the patient portal called "MyChart".  Sign up information is provided on this After Visit Summary.  MyChart is used to connect with patients for Virtual Visits (Telemedicine).  Patients are able to view lab/test results, encounter notes, upcoming appointments, etc.  Non-urgent messages can be sent to your provider as well.   To learn more about what you can do with MyChart, go to [email or web address].    Your next appointment:   12 month(s)  The format for your next appointment:   In Person  Provider:   Bridgette Christopher, MD     

## 2022-12-30 ENCOUNTER — Ambulatory Visit (INDEPENDENT_AMBULATORY_CARE_PROVIDER_SITE_OTHER): Payer: Medicare HMO | Admitting: Internal Medicine

## 2022-12-30 ENCOUNTER — Encounter: Payer: Self-pay | Admitting: Internal Medicine

## 2022-12-30 VITALS — BP 146/91 | HR 84 | Temp 98.3°F | Ht 71.0 in | Wt 175.4 lb

## 2022-12-30 DIAGNOSIS — E119 Type 2 diabetes mellitus without complications: Secondary | ICD-10-CM | POA: Diagnosis not present

## 2022-12-30 DIAGNOSIS — E7800 Pure hypercholesterolemia, unspecified: Secondary | ICD-10-CM | POA: Diagnosis not present

## 2022-12-30 DIAGNOSIS — Z0000 Encounter for general adult medical examination without abnormal findings: Secondary | ICD-10-CM | POA: Diagnosis not present

## 2022-12-30 DIAGNOSIS — E538 Deficiency of other specified B group vitamins: Secondary | ICD-10-CM

## 2022-12-30 DIAGNOSIS — Z1159 Encounter for screening for other viral diseases: Secondary | ICD-10-CM

## 2022-12-30 DIAGNOSIS — Z7984 Long term (current) use of oral hypoglycemic drugs: Secondary | ICD-10-CM | POA: Diagnosis not present

## 2022-12-30 DIAGNOSIS — C61 Malignant neoplasm of prostate: Secondary | ICD-10-CM | POA: Diagnosis not present

## 2022-12-30 LAB — COMPREHENSIVE METABOLIC PANEL
ALT: 24 U/L (ref 0–53)
AST: 23 U/L (ref 0–37)
Albumin: 4.6 g/dL (ref 3.5–5.2)
Alkaline Phosphatase: 95 U/L (ref 39–117)
BUN: 15 mg/dL (ref 6–23)
CO2: 22 mEq/L (ref 19–32)
Calcium: 9.6 mg/dL (ref 8.4–10.5)
Chloride: 101 mEq/L (ref 96–112)
Creatinine, Ser: 0.75 mg/dL (ref 0.40–1.50)
GFR: 93.43 mL/min (ref >60.00)
Glucose, Bld: 99 mg/dL (ref 70–99)
Potassium: 3.7 mEq/L (ref 3.5–5.1)
Sodium: 134 mEq/L — ABNORMAL LOW (ref 135–145)
Total Bilirubin: 1.6 mg/dL — ABNORMAL HIGH (ref 0.2–1.2)
Total Protein: 7.5 g/dL (ref 6.0–8.3)

## 2022-12-30 LAB — CBC WITH DIFFERENTIAL/PLATELET
Basophils Absolute: 0 10*3/uL (ref 0.0–0.1)
Basophils Relative: 0.7 % (ref 0.0–3.0)
Eosinophils Absolute: 0.1 10*3/uL (ref 0.0–0.7)
Eosinophils Relative: 0.9 % (ref 0.0–5.0)
HCT: 46.7 % (ref 39.0–52.0)
Hemoglobin: 15.5 g/dL (ref 13.0–17.0)
Lymphocytes Relative: 18.1 % (ref 12.0–46.0)
Lymphs Abs: 1.2 10*3/uL (ref 0.7–4.0)
MCHC: 33.3 g/dL (ref 30.0–36.0)
MCV: 94.2 fl (ref 78.0–100.0)
Monocytes Absolute: 0.5 10*3/uL (ref 0.1–1.0)
Monocytes Relative: 7.3 % (ref 3.0–12.0)
Neutro Abs: 4.7 10*3/uL (ref 1.4–7.7)
Neutrophils Relative %: 73 % (ref 43.0–77.0)
Platelets: 161 10*3/uL (ref 150.0–400.0)
RBC: 4.96 Mil/uL (ref 4.22–5.81)
RDW: 12.8 % (ref 11.5–15.5)
WBC: 6.5 10*3/uL (ref 4.0–10.5)

## 2022-12-30 LAB — LIPID PANEL
Cholesterol: 122 mg/dL (ref 0–200)
HDL: 65.4 mg/dL (ref >39.00)
LDL Cholesterol: 47 mg/dL (ref 0–99)
NonHDL: 56.4
Total CHOL/HDL Ratio: 2
Triglycerides: 47 mg/dL (ref 0.0–149.0)
VLDL: 9.4 mg/dL (ref 0.0–40.0)

## 2022-12-30 LAB — VITAMIN B12: Vitamin B-12: 579 pg/mL (ref 211–911)

## 2022-12-30 LAB — HEMOGLOBIN A1C: Hgb A1c MFr Bld: 6.3 % (ref 4.6–6.5)

## 2022-12-30 LAB — MICROALBUMIN / CREATININE URINE RATIO
Creatinine,U: 36.4 mg/dL
Microalb Creat Ratio: 1.9 mg/g (ref 0.0–30.0)
Microalb, Ur: <0.7 mg/dL (ref 0.0–1.9)

## 2022-12-30 NOTE — Progress Notes (Signed)
Established Patient Office Visit     CC/Reason for Visit: Annual preventive exam and subsequent Medicare wellness visit  HPI: Jeff Jenkins is a 68 y.o. male who is coming in today for the above mentioned reasons. Past Medical History is significant for: Hyperlipidemia, type 2 diabetes, coronary artery disease, GERD, vitamin B12 deficiency.  He has been feeling well.  He has routine eye and dental care.  Is wearing hearing aids.  Had a colonoscopy in 2018.  He follows with his urologist for his history of prostate cancer, recent PSA was 0.  He is due for RSV vaccine.   Past Medical/Surgical History: Past Medical History:  Diagnosis Date   Allergy    Arthritis    Broken ankle 2008   Coronary artery disease    Diverticulosis    ED (erectile dysfunction)    Facial fracture (HCC) 1979   GERD (gastroesophageal reflux disease)    Hearing loss in right ear    Hemorrhoids    Hiatal hernia    Hyperlipidemia    Hypertension    Peptic stricture of esophagus    Peptic ulcer disease 1989   Prostate cancer (HCC) 02/2009   Seasonal allergies    Type 2 diabetes mellitus (HCC)     Past Surgical History:  Procedure Laterality Date   ANKLE SURGERY Left 2008   CHOLECYSTECTOMY N/A 01/31/2020   Procedure: LAPAROSCOPIC CHOLECYSTECTOMY WITH INTRAOPERATIVE CHOLANGIOGRAM;  Surgeon: Berna Bue, MD;  Location: [facility];  Service: General;  Laterality: N/A;   FACIAL FRACTURE SURGERY     HERNIA REPAIR  09/26/2021   Sugery on 4 hernias in groin area  [facility]   INGUINAL HERNIA REPAIR Left    KNEE ARTHROSCOPY Left 2013   KNEE ARTHROSCOPY  07/03/2020   [facility]   PROSTATECTOMY  2011   TONSILLECTOMY     UPPER GASTROINTESTINAL ENDOSCOPY     VASECTOMY      Social History:  reports that he has never smoked. He has never used smokeless tobacco. He reports current alcohol use of about 12.0 standard drinks of alcohol per week. He reports that he does not use  drugs.  Allergies: No Known Allergies  Family History:  Family History  Problem Relation Age of Onset   Breast cancer Mother    Diabetes Mother    Heart attack Mother    Alcohol abuse Father    Stroke Brother    Colon cancer Neg Hx    Esophageal cancer Neg Hx    Rectal cancer Neg Hx    Stomach cancer Neg Hx      Current Outpatient Medications:    acetaminophen (TYLENOL) 325 MG tablet, Take 650 mg by mouth every 6 (six) hours as needed for moderate pain., Disp: , Rfl:    aspirin EC 81 MG tablet, Take 81 mg by mouth daily. Swallow whole., Disp: , Rfl:    atorvastatin (LIPITOR) 40 MG tablet, TAKE 1 TABLET BY MOUTH EVERY DAY, Disp: 90 tablet, Rfl: 0   azelastine (ASTELIN) 0.1 % nasal spray, Place 1 spray into both nostrils 2 (two) times daily. Use in each nostril as directed, Disp: 30 mL, Rfl: 2   Calcium Carbonate Antacid (TUMS PO), Take 2 tablets by mouth as needed., Disp: , Rfl:    cyanocobalamin (VITAMIN B12) 500 MCG tablet, Take 500 mcg by mouth daily., Disp: , Rfl:    diclofenac (VOLTAREN) 75 MG EC tablet, Take 75 mg by mouth 2 (two) times  daily., Disp: , Rfl:    dicyclomine (BENTYL) 10 MG capsule, TAKE 1 CAPSULE (10 MG TOTAL) BY MOUTH 3 (THREE) TIMES DAILY AS NEEDED FOR SPASMS., Disp: 270 capsule, Rfl: 5   empagliflozin (JARDIANCE) 25 MG TABS tablet, Take 1 tablet (25 mg total) by mouth daily before breakfast., Disp: 28 tablet, Rfl: 0   esomeprazole (NEXIUM) 40 MG capsule, TAKE 1 CAPSULE BY MOUTH DAILY BEFORE BREAKFAST., Disp: 90 capsule, Rfl: 3   ezetimibe (ZETIA) 10 MG tablet, TAKE 1 TABLET BY MOUTH DAILY, Disp: 90 tablet, Rfl: 1   fexofenadine (ALLEGRA) 180 MG tablet, Take 180 mg by mouth daily., Disp: , Rfl:    Lancets (ONETOUCH ULTRASOFT) lancets, 1 each by Other route daily. Dx E11.9, Disp: 100 each, Rfl: 3   losartan (COZAAR) 25 MG tablet, Take 1 tablet (25 mg total) by mouth daily., Disp: 90 tablet, Rfl: 3   metformin (FORTAMET) 1000 MG (OSM) 24 hr tablet, Take 1,000 mg  by mouth daily with breakfast., Disp: , Rfl:    ONETOUCH ULTRA test strip, CHECK BLOOD SUGAR DAILY, Disp: 100 strip, Rfl: 3   sildenafil (VIAGRA) 100 MG tablet, TAKE 1 TABLET BY MOUTH DAILY AS NEEDED, Disp: 10 tablet, Rfl: 11   SYNJARDY XR 25-1000 MG TB24, TAKE 1 TABLET BY MOUTH EVERY DAY, Disp: 30 tablet, Rfl: 2   tadalafil (CIALIS) 5 MG tablet, Take 5 mg by mouth daily as needed for erectile dysfunction. , Disp: , Rfl:    Testosterone 30 MG/ACT SOLN, Place onto the skin daily., Disp: , Rfl:   Review of Systems:  Negative unless indicated in HPI.   Physical Exam: Vitals:   12/30/22 1357 12/30/22 1401  BP: (!) 150/94 (!) 146/91  Pulse: 84   Temp: 98.3 F (36.8 C)   TempSrc: Oral   SpO2: 94%   Weight: 175 lb 6.4 oz (79.6 kg)   Height: 5\' 11"  (1.803 m)     Body mass index is 24.46 kg/m.   Physical Exam Vitals reviewed.  Constitutional:      General: He is not in acute distress.    Appearance: Normal appearance. He is not ill-appearing, toxic-appearing or diaphoretic.  HENT:     Head: Normocephalic.     Right Ear: Tympanic membrane, ear canal and external ear normal. There is no impacted cerumen.     Left Ear: Tympanic membrane, ear canal and external ear normal. There is no impacted cerumen.     Nose: Nose normal.     Mouth/Throat:     Mouth: Mucous membranes are moist.     Pharynx: Oropharynx is clear. No oropharyngeal exudate or posterior oropharyngeal erythema.  Eyes:     General: No scleral icterus.       Right eye: No discharge.        Left eye: No discharge.     Conjunctiva/sclera: Conjunctivae normal.     Pupils: Pupils are equal, round, and reactive to light.  Neck:     Vascular: No carotid bruit.  Cardiovascular:     Rate and Rhythm: Normal rate and regular rhythm.     Pulses: Normal pulses.     Heart sounds: Normal heart sounds.  Pulmonary:     Effort: Pulmonary effort is normal. No respiratory distress.     Breath sounds: Normal breath sounds.   Abdominal:     General: Abdomen is flat. Bowel sounds are normal.     Palpations: Abdomen is soft.  Musculoskeletal:        General:  Normal range of motion.     Cervical back: Normal range of motion.  Skin:    General: Skin is warm and dry.  Neurological:     General: No focal deficit present.     Mental Status: He is alert and oriented to person, place, and time. Mental status is at baseline.  Psychiatric:        Mood and Affect: Mood normal.        Behavior: Behavior normal.        Thought Content: Thought content normal.        Judgment: Judgment normal.    Subsequent Medicare wellness visit   1. Risk factors, based on past  M,S,F - Cardiac Risk Factors include: advanced age (>38men, >64 women);male gender;diabetes mellitus;dyslipidemia   2.  Physical activities: Dietary issues and exercise activities discussed:  Current Exercise Habits: Home exercise routine, Type of exercise: walking, Time (Minutes): > 60, Frequency (Times/Week): 6, Weekly Exercise (Minutes/Week): 0, Intensity: Moderate   3.  Depression/mood:  Flowsheet Row Office Visit from 12/30/2022 in [facility] Total Score 0        4.  ADL's:    12/26/2022    2:56 PM  In your present state of health, do you have any difficulty performing the following activities:  Hearing? 1  Comment hearing aids  Vision? 0  Difficulty concentrating or making decisions? 0  Walking or climbing stairs? 0  Dressing or bathing? 0  Doing errands, shopping? 0  Preparing Food and eating ? N  Using the Toilet? N  In the past six months, have you accidently leaked urine? N  Do you have problems with loss of bowel control? N  Managing your Medications? N  Managing your Finances? N  Housekeeping or managing your Housekeeping? N     5.  Fall risk:     06/22/2021    9:21 AM 06/26/2021   10:43 AM 09/25/2021   11:45 AM 05/30/2022    1:06 PM 12/26/2022    2:59 PM  Fall Risk  Falls in the  past year? 0 0 0 0 0  Was there an injury with Fall?   0 0 0  Fall Risk Category Calculator   0 0 0  Fall Risk Category (Retired)   Low Low   (RETIRED) Patient Fall Risk Level  Low fall risk  Low fall risk   Patient at Risk for Falls Due to    No Fall Risks   Fall risk Follow up   Falls evaluation completed Falls evaluation completed Falls evaluation completed     6.  Home safety: No problems identified   7.  Height weight, and visual acuity: height and weight as above, vision/hearing: Vision Screening   Right eye Left eye Both eyes  Without correction 20/20 20/20 20/20   With correction        8.  Counseling: Counseling given: Not Answered    9. Lab orders based on risk factors: Laboratory update will be reviewed   10. Cognitive assessment:        12/26/2022    3:00 PM  6CIT Screen  What Year? 0 points  What month? 0 points  What time? 0 points  Count back from 20 0 points  Months in reverse 0 points  Repeat phrase 0 points  Total Score 0 points     11. Screening: Patient provided with a written and personalized 5-10 year screening schedule in the AVS. Health Maintenance  Topic Date Due   Hepatitis C Screening  Never done   COVID-19 Vaccine (6 - 2023-24 season) 03/15/2022   Hemoglobin A1C  06/27/2022   Yearly kidney function blood test for diabetes  12/27/2022   Yearly kidney health urinalysis for diabetes  12/27/2022   Eye exam for diabetics  12/30/2022*   Flu Shot  02/13/2023   Complete foot exam   12/30/2023   Medicare Annual Wellness Visit  12/30/2023   Pneumonia Vaccine (3 of 3 - PPSV23 or PCV20) 12/22/2025   Colon Cancer Screening  01/28/2027   DTaP/Tdap/Td vaccine (3 - Td or Tdap) 02/10/2029   Zoster (Shingles) Vaccine  Completed   HPV Vaccine  Aged Out  *Topic was postponed. The date shown is not the original due date.    12. Provider List Update: Patient Care Team    Relationship Specialty Notifications Start End  Philip Aspen, Limmie Patricia, MD  PCP - General Internal Medicine  02/16/20   Jodelle Red, MD PCP - Cardiology Cardiology  06/25/22   Karie Soda, MD Consulting Physician General Surgery  09/04/21   Bjorn Pippin, MD Attending Physician Urology  09/04/21   Durene Romans, MD Consulting Physician Orthopedic Surgery  09/04/21      13. Advance Directives: Does Patient Have a Medical Advance Directive?: Yes Type of Advance Directive: Out of facility DNR (pink MOST or yellow form), Living will, Healthcare Power of Attorney Does patient want to make changes to medical advance directive?: No - Patient declined Copy of Healthcare Power of Attorney in Chart?: No - copy requested  14. Opioids: Patient is not on any opioid prescriptions and has no risk factors for a substance use disorder.   15.   Goals      Protect My Health     Timeframe:  Long-Range Goal Priority:  Medium Start Date:                             Expected End Date:                       Follow Up Date 12/26/2023    - schedule and keep appointment for annual check-up    Why is this important?   Screening tests can find diseases early when they are easier to treat.  Your doctor or nurse will talk with you about which tests are important for you.  Getting shots for common diseases like the flu and shingles will help prevent them.     Notes:          I have personally reviewed and noted the following in the patient's chart:   Medical and social history Use of alcohol, tobacco or illicit drugs  Current medications and supplements Functional ability and status Nutritional status Physical activity Advanced directives List of other physicians Hospitalizations, surgeries, and ER visits in previous 12 months Vitals Screenings to include cognitive, depression, and falls Referrals and appointments  In addition, I have reviewed and discussed with patient certain preventive protocols, quality metrics, and best practice recommendations. A written  personalized care plan for preventive services as well as general preventive health recommendations were provided to patient.   Impression and Plan:  Medicare annual wellness visit, subsequent  Type 2 diabetes mellitus without obesity (HCC) -     CBC with Differential/Platelet; Future -     Comprehensive metabolic panel; Future -     Hemoglobin A1c -  Microalbumin / creatinine urine ratio; Future  Pure hypercholesterolemia -     Lipid panel; Future  Prostate cancer (HCC)  Vitamin B12 deficiency -     Vitamin B12; Future  Encounter for hepatitis C screening test for low risk patient -     Hepatitis C antibody; Future   -Recommend routine eye and dental care. -Healthy lifestyle discussed in detail. -Labs to be updated today. -Prostate cancer screening: PSA 0 in Jan. Followed by [facility] Maintenance  Topic Date Due   Hepatitis C Screening  Never done   COVID-19 Vaccine (6 - 2023-24 season) 03/15/2022   Hemoglobin A1C  06/27/2022   Yearly kidney function blood test for diabetes  12/27/2022   Yearly kidney health urinalysis for diabetes  12/27/2022   Eye exam for diabetics  12/30/2022*   Flu Shot  02/13/2023   Complete foot exam   12/30/2023   Medicare Annual Wellness Visit  12/30/2023   Pneumonia Vaccine (3 of 3 - PPSV23 or PCV20) 12/22/2025   Colon Cancer Screening  01/28/2027   DTaP/Tdap/Td vaccine (3 - Td or Tdap) 02/10/2029   Zoster (Shingles) Vaccine  Completed   HPV Vaccine  Aged Out  *Topic was postponed. The date shown is not the original due date.     -Advised to update RSV vaccine.    Chaya Jan, MD [facility]

## 2022-12-31 LAB — HEPATITIS C ANTIBODY: Hepatitis C Ab: NONREACTIVE

## 2023-01-02 ENCOUNTER — Encounter: Payer: Self-pay | Admitting: Internal Medicine

## 2023-01-03 DIAGNOSIS — H5203 Hypermetropia, bilateral: Secondary | ICD-10-CM | POA: Diagnosis not present

## 2023-01-03 DIAGNOSIS — H2513 Age-related nuclear cataract, bilateral: Secondary | ICD-10-CM | POA: Diagnosis not present

## 2023-01-03 DIAGNOSIS — C4441 Basal cell carcinoma of skin of scalp and neck: Secondary | ICD-10-CM | POA: Diagnosis not present

## 2023-01-03 DIAGNOSIS — D225 Melanocytic nevi of trunk: Secondary | ICD-10-CM | POA: Diagnosis not present

## 2023-01-03 DIAGNOSIS — H52221 Regular astigmatism, right eye: Secondary | ICD-10-CM | POA: Diagnosis not present

## 2023-01-03 DIAGNOSIS — E119 Type 2 diabetes mellitus without complications: Secondary | ICD-10-CM | POA: Diagnosis not present

## 2023-01-03 DIAGNOSIS — D485 Neoplasm of uncertain behavior of skin: Secondary | ICD-10-CM | POA: Diagnosis not present

## 2023-01-03 DIAGNOSIS — H524 Presbyopia: Secondary | ICD-10-CM | POA: Diagnosis not present

## 2023-01-03 DIAGNOSIS — H53143 Visual discomfort, bilateral: Secondary | ICD-10-CM | POA: Diagnosis not present

## 2023-01-03 DIAGNOSIS — Z1283 Encounter for screening for malignant neoplasm of skin: Secondary | ICD-10-CM | POA: Diagnosis not present

## 2023-01-03 LAB — HM DIABETES EYE EXAM

## 2023-01-14 DIAGNOSIS — E291 Testicular hypofunction: Secondary | ICD-10-CM | POA: Diagnosis not present

## 2023-01-14 DIAGNOSIS — Z8546 Personal history of malignant neoplasm of prostate: Secondary | ICD-10-CM | POA: Diagnosis not present

## 2023-01-22 DIAGNOSIS — N3946 Mixed incontinence: Secondary | ICD-10-CM | POA: Diagnosis not present

## 2023-01-22 DIAGNOSIS — N5231 Erectile dysfunction following radical prostatectomy: Secondary | ICD-10-CM | POA: Diagnosis not present

## 2023-01-22 DIAGNOSIS — E291 Testicular hypofunction: Secondary | ICD-10-CM | POA: Diagnosis not present

## 2023-01-22 DIAGNOSIS — Z8546 Personal history of malignant neoplasm of prostate: Secondary | ICD-10-CM | POA: Diagnosis not present

## 2023-01-24 ENCOUNTER — Other Ambulatory Visit: Payer: Self-pay | Admitting: Internal Medicine

## 2023-01-24 ENCOUNTER — Encounter: Admit: 2023-01-24 | Discharge: 2023-01-24 | Payer: MEDICARE

## 2023-01-24 DIAGNOSIS — E119 Type 2 diabetes mellitus without complications: Secondary | ICD-10-CM

## 2023-01-31 DIAGNOSIS — L570 Actinic keratosis: Secondary | ICD-10-CM | POA: Diagnosis not present

## 2023-01-31 DIAGNOSIS — D485 Neoplasm of uncertain behavior of skin: Secondary | ICD-10-CM | POA: Diagnosis not present

## 2023-01-31 DIAGNOSIS — X32XXXD Exposure to sunlight, subsequent encounter: Secondary | ICD-10-CM | POA: Diagnosis not present

## 2023-02-12 DIAGNOSIS — M1712 Unilateral primary osteoarthritis, left knee: Secondary | ICD-10-CM | POA: Diagnosis not present

## 2023-03-07 ENCOUNTER — Other Ambulatory Visit: Payer: Self-pay | Admitting: Internal Medicine

## 2023-03-07 DIAGNOSIS — E7800 Pure hypercholesterolemia, unspecified: Secondary | ICD-10-CM

## 2023-03-11 ENCOUNTER — Encounter: Admit: 2023-03-11 | Discharge: 2023-03-11 | Payer: MEDICARE

## 2023-03-25 ENCOUNTER — Ambulatory Visit (INDEPENDENT_AMBULATORY_CARE_PROVIDER_SITE_OTHER): Payer: Medicare Other

## 2023-03-25 DIAGNOSIS — Z23 Encounter for immunization: Secondary | ICD-10-CM | POA: Diagnosis not present

## 2023-03-25 NOTE — Progress Notes (Signed)
Per orders of Dr. Ardyth Harps, injection of Flu Vaccine  given by Stann Ore. Patient tolerated injection well.

## 2023-04-15 DIAGNOSIS — M79641 Pain in right hand: Secondary | ICD-10-CM | POA: Diagnosis not present

## 2023-04-17 DIAGNOSIS — M79641 Pain in right hand: Secondary | ICD-10-CM | POA: Diagnosis not present

## 2023-04-18 DIAGNOSIS — M79641 Pain in right hand: Secondary | ICD-10-CM | POA: Diagnosis not present

## 2023-04-19 ENCOUNTER — Other Ambulatory Visit: Payer: Self-pay | Admitting: Internal Medicine

## 2023-04-19 DIAGNOSIS — E7800 Pure hypercholesterolemia, unspecified: Secondary | ICD-10-CM

## 2023-04-24 ENCOUNTER — Encounter: Payer: Self-pay | Admitting: Internal Medicine

## 2023-04-26 ENCOUNTER — Other Ambulatory Visit: Payer: Self-pay | Admitting: Internal Medicine

## 2023-04-26 DIAGNOSIS — E119 Type 2 diabetes mellitus without complications: Secondary | ICD-10-CM

## 2023-04-28 MED ORDER — AZELASTINE HCL 0.1 % NA SOLN: 1.0000 | Freq: Two times a day (BID) | NASAL | 2 refills | Status: DC

## 2023-04-28 NOTE — Telephone Encounter (Signed)
Ipratropium nasal spray is not on current medication list.  Okay to refill?

## 2023-04-29 ENCOUNTER — Encounter: Payer: Self-pay | Admitting: Internal Medicine

## 2023-04-30 MED ORDER — IPRATROPIUM BROMIDE 0.02 % IN SOLN: 0.5000 mg | Freq: Four times a day (QID) | RESPIRATORY_TRACT | 12 refills | Status: AC

## 2023-04-30 MED ORDER — AZELASTINE HCL 0.1 % NA SOLN: 1.0000 | Freq: Two times a day (BID) | NASAL | 2 refills | Status: DC

## 2023-04-30 NOTE — Addendum Note (Signed)
Addended by: Kern Reap B on: 04/30/2023 11:54 AM   Modules accepted: Orders

## 2023-05-02 ENCOUNTER — Other Ambulatory Visit (HOSPITAL_BASED_OUTPATIENT_CLINIC_OR_DEPARTMENT_OTHER): Payer: Self-pay

## 2023-05-02 MED ORDER — RSVPREF3 VAC RECOMB ADJUVANTED 120 MCG/0.5ML IM SUSR
0.5000 mL | Freq: Once | INTRAMUSCULAR | 0 refills | Status: AC
  Filled 2023-05-02: qty 0.5, 1d supply, fill #0

## 2023-05-12 ENCOUNTER — Encounter: Admit: 2023-05-12 | Discharge: 2023-05-12 | Payer: MEDICARE

## 2023-05-12 MED ORDER — METOPROLOL SUCCINATE 100 MG PO TB24
100 mg | ORAL_TABLET | Freq: Two times a day (BID) | ORAL | 2 refills | 90.00000 days | Status: AC
Start: 2023-05-12 — End: ?

## 2023-05-16 DIAGNOSIS — Z96651 Presence of right artificial knee joint: Secondary | ICD-10-CM | POA: Diagnosis not present

## 2023-05-16 DIAGNOSIS — Z471 Aftercare following joint replacement surgery: Secondary | ICD-10-CM | POA: Diagnosis not present

## 2023-05-16 DIAGNOSIS — M1712 Unilateral primary osteoarthritis, left knee: Secondary | ICD-10-CM | POA: Diagnosis not present

## 2023-06-17 ENCOUNTER — Encounter: Admit: 2023-06-17 | Discharge: 2023-06-17 | Payer: MEDICARE

## 2023-06-18 ENCOUNTER — Other Ambulatory Visit (HOSPITAL_COMMUNITY): Payer: Self-pay

## 2023-07-03 ENCOUNTER — Encounter: Payer: Self-pay | Admitting: Internal Medicine

## 2023-07-03 NOTE — Telephone Encounter (Signed)
 Care team updated and letter sent for eye exam notes.

## 2023-07-17 DIAGNOSIS — G5601 Carpal tunnel syndrome, right upper limb: Secondary | ICD-10-CM | POA: Diagnosis not present

## 2023-07-18 DIAGNOSIS — L814 Other melanin hyperpigmentation: Secondary | ICD-10-CM | POA: Diagnosis not present

## 2023-07-18 DIAGNOSIS — D225 Melanocytic nevi of trunk: Secondary | ICD-10-CM | POA: Diagnosis not present

## 2023-07-18 DIAGNOSIS — D2271 Melanocytic nevi of right lower limb, including hip: Secondary | ICD-10-CM | POA: Diagnosis not present

## 2023-07-18 DIAGNOSIS — Z1283 Encounter for screening for malignant neoplasm of skin: Secondary | ICD-10-CM | POA: Diagnosis not present

## 2023-07-18 DIAGNOSIS — D485 Neoplasm of uncertain behavior of skin: Secondary | ICD-10-CM | POA: Diagnosis not present

## 2023-07-26 ENCOUNTER — Other Ambulatory Visit: Payer: Self-pay | Admitting: Internal Medicine

## 2023-08-05 DIAGNOSIS — L820 Inflamed seborrheic keratosis: Secondary | ICD-10-CM | POA: Diagnosis not present

## 2023-08-05 DIAGNOSIS — D485 Neoplasm of uncertain behavior of skin: Secondary | ICD-10-CM | POA: Diagnosis not present

## 2023-08-05 DIAGNOSIS — L988 Other specified disorders of the skin and subcutaneous tissue: Secondary | ICD-10-CM | POA: Diagnosis not present

## 2023-08-05 DIAGNOSIS — L080 Pyoderma: Secondary | ICD-10-CM | POA: Diagnosis not present

## 2023-08-05 DIAGNOSIS — L570 Actinic keratosis: Secondary | ICD-10-CM | POA: Diagnosis not present

## 2023-08-05 DIAGNOSIS — X32XXXD Exposure to sunlight, subsequent encounter: Secondary | ICD-10-CM | POA: Diagnosis not present

## 2023-08-05 DIAGNOSIS — L720 Epidermal cyst: Secondary | ICD-10-CM | POA: Diagnosis not present

## 2023-08-11 ENCOUNTER — Encounter: Admit: 2023-08-11 | Discharge: 2023-08-11 | Payer: MEDICARE

## 2023-08-22 ENCOUNTER — Encounter: Admit: 2023-08-22 | Discharge: 2023-08-22 | Payer: MEDICARE

## 2023-08-23 ENCOUNTER — Other Ambulatory Visit (HOSPITAL_BASED_OUTPATIENT_CLINIC_OR_DEPARTMENT_OTHER): Payer: Self-pay | Admitting: Cardiology

## 2023-08-23 DIAGNOSIS — I10 Essential (primary) hypertension: Secondary | ICD-10-CM

## 2023-09-02 ENCOUNTER — Other Ambulatory Visit: Payer: Self-pay | Admitting: Internal Medicine

## 2023-09-02 DIAGNOSIS — E7800 Pure hypercholesterolemia, unspecified: Secondary | ICD-10-CM

## 2023-09-09 ENCOUNTER — Telehealth: Payer: Self-pay | Admitting: *Deleted

## 2023-09-09 NOTE — Telephone Encounter (Signed)
   Pre-operative Risk Assessment    Patient Name: Jeff Jenkins  DOB: 08/30/1955 MRN: [id number]   Date of last office visit: 12/26/22 DR. BRIDGETT CHRISTOPHER  Date of next office visit: 12/17/23 DR. BRIDGETTE CHRISTOPHER   Request for Surgical Clearance    Procedure:   LEFT TOTAL KNEE ARTHROPLASTY  Date of Surgery:  Clearance 12/01/23                                Surgeon:  DR. MATTHEW OLIN Surgeon's Group or Practice Name:  [facility] Phone number:  [phone] Rosalva Ferron Fax number:  [phone]   Type of Clearance Requested:   - Medical  - Pharmacy:  Hold Aspirin     Type of Anesthesia:  Spinal   Additional requests/questions:    Elpidio Anis   09/09/2023, 5:55 PM

## 2023-09-10 ENCOUNTER — Telehealth: Payer: Self-pay | Admitting: *Deleted

## 2023-09-10 NOTE — Telephone Encounter (Signed)
 Left message to call back to schedule tele pre op appt.

## 2023-09-10 NOTE — Telephone Encounter (Signed)
   Name: Jeff Jenkins  DOB: [birth year]  MRN: [id number]  Primary Cardiologist: Jodelle Red, MD   Preoperative team, please contact this patient and set up a phone call appointment on or after 10/01/2023 for further preoperative risk assessment. Please obtain consent and complete medication review. Thank you for your help.  I confirm that guidance regarding antiplatelet and oral anticoagulation therapy has been completed and, if necessary, noted below.  Per office protocol, if patient is without any new symptoms or concerns at the time of their virtual visit, he may hold Aspirin for 5-7 days prior to procedure. Please resume Aspirin as soon as possible postprocedure, at the discretion of the surgeon.   I also confirmed the patient resides in the state of [place]. As per [facility] Medical Board telemedicine laws, the patient must reside in the state in which the provider is licensed.   Joylene Grapes, NP 09/10/2023, 8:16 AM [facility]

## 2023-09-10 NOTE — Telephone Encounter (Signed)
 Patient is returning phone call for a tele visit appointment.

## 2023-09-10 NOTE — Telephone Encounter (Signed)
 Pt has been scheduled tele preop appt 10/21/23. Med rec and consent are done.

## 2023-09-10 NOTE — Telephone Encounter (Signed)
 Pt has been scheduled tele preop appt 10/21/23. Med rec and consent are done.      Patient Consent for Virtual Visit        Jeff Jenkins has provided verbal consent on 09/10/2023 for a virtual visit (video or telephone).   CONSENT FOR VIRTUAL VISIT FOR:  Jeff Jenkins  By participating in this virtual visit I agree to the following:  I hereby voluntarily request, consent and authorize [facility] and its employed or contracted physicians, physician assistants, nurse practitioners or other licensed health care professionals (the Practitioner), to provide me with telemedicine health care services (the "Services") as deemed necessary by the treating Practitioner. I acknowledge and consent to receive the Services by the Practitioner via telemedicine. I understand that the telemedicine visit will involve communicating with the Practitioner through live audiovisual communication technology and the disclosure of certain medical information by electronic transmission. I acknowledge that I have been given the opportunity to request an in-person assessment or other available alternative prior to the telemedicine visit and am voluntarily participating in the telemedicine visit.  I understand that I have the right to withhold or withdraw my consent to the use of telemedicine in the course of my care at any time, without affecting my right to future care or treatment, and that the Practitioner or I may terminate the telemedicine visit at any time. I understand that I have the right to inspect all information obtained and/or recorded in the course of the telemedicine visit and may receive copies of available information for a reasonable fee.  I understand that some of the potential risks of receiving the Services via telemedicine include:  Delay or interruption in medical evaluation due to technological equipment failure or disruption; Information transmitted may not be sufficient (e.g. poor  resolution of images) to allow for appropriate medical decision making by the Practitioner; and/or  In rare instances, security protocols could fail, causing a breach of personal health information.  Furthermore, I acknowledge that it is my responsibility to provide information about my medical history, conditions and care that is complete and accurate to the best of my ability. I acknowledge that Practitioner's advice, recommendations, and/or decision may be based on factors not within their control, such as incomplete or inaccurate data provided by me or distortions of diagnostic images or specimens that may result from electronic transmissions. I understand that the practice of medicine is not an exact science and that Practitioner makes no warranties or guarantees regarding treatment outcomes. I acknowledge that a copy of this consent can be made available to me via my patient portal [facility] MyChart), or I can request a printed copy by calling the office of [facility].    I understand that my insurance will be billed for this visit.   I have read or had this consent read to me. I understand the contents of this consent, which adequately explains the benefits and risks of the Services being provided via telemedicine.  I have been provided ample opportunity to ask questions regarding this consent and the Services and have had my questions answered to my satisfaction. I give my informed consent for the services to be provided through the use of telemedicine in my medical care

## 2023-10-17 ENCOUNTER — Other Ambulatory Visit: Payer: Self-pay | Admitting: Internal Medicine

## 2023-10-17 DIAGNOSIS — E7800 Pure hypercholesterolemia, unspecified: Secondary | ICD-10-CM

## 2023-10-30 DIAGNOSIS — M6281 Muscle weakness (generalized): Secondary | ICD-10-CM | POA: Diagnosis not present

## 2023-10-30 DIAGNOSIS — M25662 Stiffness of left knee, not elsewhere classified: Secondary | ICD-10-CM | POA: Diagnosis not present

## 2023-10-30 DIAGNOSIS — M25562 Pain in left knee: Secondary | ICD-10-CM | POA: Diagnosis not present

## 2023-11-07 ENCOUNTER — Telehealth: Payer: Self-pay | Admitting: Internal Medicine

## 2023-11-07 NOTE — Telephone Encounter (Signed)
 Patient dropped off document  pre op clearance , to be filled out by provider. Patient requested to send it back via Fax within 5-days. Document is located in providers tray at front office.Please advise at Mobile [phone] (mobile)

## 2023-11-09 NOTE — Progress Notes (Unsigned)
 Virtual Visit via Telephone Note   Because of Jeff Jenkins co-morbid illnesses, he is at least at moderate risk for complications without adequate follow up.  This format is felt to be most appropriate for this patient at this time.  Due to technical limitations with video connection (technology), today's appointment will be conducted as an audio only telehealth visit, and Jeff Jenkins verbally agreed to proceed in this manner.   All issues noted in this document were discussed and addressed.  No physical exam could be performed with this format.  Evaluation Performed:  Preoperative cardiovascular risk assessment _____________   Date:  11/09/2023   Patient ID:  Jeff Jenkins, DOB 01/15/1956, MRN [id number] Patient Location:  Home Provider location:   Office  Primary Care Provider:  Zilphia Hilt, Charyl Coppersmith, Jenkins Primary Cardiologist:  Jeff Jenkins  Chief Complaint / Patient Profile   68 y.o. y/o male with a h/o PVCs, CAD, hyperlipidemia who is pending left total knee arthroplasty and presents today for telephonic preoperative cardiovascular risk assessment.  History of Present Illness    Jeff Jenkins is a 68 y.o. male who presents via audio/video conferencing for a telehealth visit today.  Pt was last seen in cardiology clinic on 12/26/2022 by Jeff Jenkins.  At that time Jeff Jenkins was doing well .  The patient is now pending procedure as outlined above. Since his last visit, he continues to be stable from a cardiac standpoint.  Today he denies chest pain, shortness of breath, lower extremity edema, fatigue, palpitations, melena, hematuria, hemoptysis, diaphoresis, weakness, presyncope, syncope, orthopnea, and PND.   Past Medical History    Past Medical History:  Diagnosis Date   Allergy    Arthritis    Broken ankle 2008   Coronary artery disease    Diverticulosis    ED (erectile dysfunction)    Facial fracture (HCC) 1979    GERD (gastroesophageal reflux disease)    Hearing loss in right ear    Hemorrhoids    Hiatal hernia    Hyperlipidemia    Hypertension    Peptic stricture of esophagus    Peptic ulcer disease 1989   Prostate cancer (HCC) 02/2009   Seasonal allergies    Type 2 diabetes mellitus (HCC)    Past Surgical History:  Procedure Laterality Date   ANKLE SURGERY Left 2008   CHOLECYSTECTOMY N/A 01/31/2020   Procedure: LAPAROSCOPIC CHOLECYSTECTOMY WITH INTRAOPERATIVE CHOLANGIOGRAM;  Surgeon: Adalberto Acton, Jenkins;  Location: [facility];  Service: General;  Laterality: N/A;   FACIAL FRACTURE SURGERY     HERNIA REPAIR  09/26/2021   Sugery on 4 hernias in groin area  [facility]   INGUINAL HERNIA REPAIR Left    KNEE ARTHROSCOPY Left 2013   KNEE ARTHROSCOPY  07/03/2020   [facility]   PROSTATECTOMY  2011   TONSILLECTOMY     UPPER GASTROINTESTINAL ENDOSCOPY     VASECTOMY      Allergies  No Known Allergies  Home Medications    Prior to Admission medications   Medication Sig Start Date End Date Taking? Authorizing Provider  acetaminophen  (TYLENOL ) 325 MG tablet Take 650 mg by mouth every 6 (six) hours as needed for moderate pain.    Provider, Historical, Jenkins  aspirin EC 81 MG tablet Take 81 mg by mouth daily. Swallow whole.    Provider, Historical, Jenkins  atorvastatin  (LIPITOR) 40 MG tablet TAKE 1 TABLET BY MOUTH EVERY DAY 09/02/23   Jeff Marines  Fran Imus, Jenkins  Azelastine  HCl 137 MCG/SPRAY SOLN PLACE 1 SPRAY INTO BOTH NOSTRILS 2 (TWO) TIMES DAILY 07/28/23   Jeff Jenkins  Calcium  Carbonate Antacid (TUMS PO) Take 2 tablets by mouth as needed.    Provider, Historical, Jenkins  cyanocobalamin  (VITAMIN B12) 500 MCG tablet Take 500 mcg by mouth daily.    Provider, Historical, Jenkins  diclofenac (VOLTAREN) 75 MG EC tablet Take 75 mg by mouth 2 (two) times daily as needed. 12/06/21   Provider, Historical, Jenkins  dicyclomine  (BENTYL ) 10 MG capsule TAKE 1 CAPSULE (10 MG TOTAL) BY MOUTH 3  (THREE) TIMES DAILY AS NEEDED FOR SPASMS. Patient not taking: Reported on 09/10/2023 01/28/22   Jeff Jenkins  empagliflozin  (JARDIANCE ) 25 MG TABS tablet Take 1 tablet (25 mg total) by mouth daily before breakfast. Patient not taking: Reported on 09/10/2023 06/20/22   Jeff Jenkins  esomeprazole  (NEXIUM ) 40 MG capsule TAKE 1 CAPSULE BY MOUTH DAILY BEFORE BREAKFAST. 11/06/22   Jeff Jenkins  ezetimibe  (ZETIA ) 10 MG tablet TAKE 1 TABLET BY MOUTH DAILY 10/20/23   Jeff Jenkins  fexofenadine (ALLEGRA) 180 MG tablet Take 180 mg by mouth daily.    Provider, Historical, Jenkins  ipratropium (ATROVENT ) 0.02 % nebulizer solution Take 2.5 mLs (0.5 mg total) by nebulization 4 (four) times daily. Patient not taking: Reported on 09/10/2023 04/30/23   Jeff Jenkins  Lancets [facility] ULTRASOFT) lancets 1 each by Other route daily. Dx E11.9 05/05/20   Jeff Jenkins  losartan  (COZAAR ) 25 MG tablet TAKE 1 TABLET (25 MG TOTAL) BY MOUTH DAILY. 08/25/23   Jeff Jenkins  metformin  (FORTAMET ) 1000 MG (OSM) 24 hr tablet Take 1,000 mg by mouth daily with breakfast.    Provider, Historical, Jenkins  [facility] ULTRA test strip CHECK BLOOD SUGAR DAILY 06/07/20   Jeff Jenkins  sildenafil (VIAGRA) 100 MG tablet TAKE 1 TABLET BY MOUTH DAILY AS NEEDED 05/17/20   Jeff Jenkins  SYNJARDY  XR 25-1000 MG TB24 TAKE 1 TABLET BY MOUTH EVERY DAY 04/28/23   Jeff Jenkins  tadalafil (CIALIS) 5 MG tablet Take 5 mg by mouth daily as needed for erectile dysfunction.     Provider, Historical, Jenkins  Testosterone  30 MG/ACT SOLN Place onto the skin daily.    Provider, Historical, Jenkins    Physical Exam    Vital Signs:  Jeff Jenkins does not have vital signs available for review today.  Given telephonic nature of communication, physical exam is limited. AAOx3. NAD. Normal affect.  Speech and respirations are  unlabored.  Accessory Clinical Findings    None  Assessment & Plan    1.  Preoperative Cardiovascular Risk Assessment:Procedure:   LEFT TOTAL KNEE ARTHROPLASTY   Date of Surgery:  Clearance 12/01/23                                  Surgeon:  DR. MATTHEW OLIN Surgeon's Group or Practice Name:  [facility] Phone number:  [phone] Amanda Jungling Fax number:  [phone]      Primary Cardiologist: Jeff Jenkins  Chart reviewed as part of pre-operative protocol coverage. Given past medical history and time since last visit, based on ACC/AHA guidelines, Jaecion Albini would be at acceptable risk for the planned procedure without further cardiovascular testing.   His RCRI  is low risk, 0.9% risk of major cardiac event.  He is able to complete greater than 4 METS of physical activity.  Patient was advised that if he develops new symptoms prior to surgery to contact our office to arrange a follow-up appointment.  He verbalized understanding.  He may hold Aspirin for 5-7 days prior to procedure. Please resume Aspirin as soon as possible postprocedure, at the discretion of the surgeon.   I will route this recommendation to the requesting party via Epic fax function and remove from pre-op pool.       Time:   Today, I have spent 5 minutes with the patient with telehealth technology discussing medical history, symptoms, and management plan.  I spent 10 minutes reviewing past medical history, cardiac medications, and cardiac test.   Carie Charity, NP  11/09/2023, 6:50 PM

## 2023-11-10 ENCOUNTER — Ambulatory Visit: Payer: Medicare HMO | Attending: Cardiovascular Disease

## 2023-11-10 DIAGNOSIS — Z01810 Encounter for preprocedural cardiovascular examination: Secondary | ICD-10-CM | POA: Diagnosis not present

## 2023-11-10 NOTE — Telephone Encounter (Signed)
 Placed in Dr Hardie Shackleton folder

## 2023-11-11 ENCOUNTER — Other Ambulatory Visit: Payer: Self-pay | Admitting: Internal Medicine

## 2023-11-11 ENCOUNTER — Other Ambulatory Visit (INDEPENDENT_AMBULATORY_CARE_PROVIDER_SITE_OTHER)

## 2023-11-11 DIAGNOSIS — E119 Type 2 diabetes mellitus without complications: Secondary | ICD-10-CM

## 2023-11-11 LAB — CBC WITH DIFFERENTIAL/PLATELET
Basophils Absolute: 0 10*3/uL (ref 0.0–0.1)
Basophils Relative: 0.6 % (ref 0.0–3.0)
Eosinophils Absolute: 0.2 10*3/uL (ref 0.0–0.7)
Eosinophils Relative: 3.6 % (ref 0.0–5.0)
HCT: 46 % (ref 39.0–52.0)
Hemoglobin: 15.7 g/dL (ref 13.0–17.0)
Lymphocytes Relative: 26.5 % (ref 12.0–46.0)
Lymphs Abs: 1.2 10*3/uL (ref 0.7–4.0)
MCHC: 34.2 g/dL (ref 30.0–36.0)
MCV: 94.6 fl (ref 78.0–100.0)
Monocytes Absolute: 0.4 10*3/uL (ref 0.1–1.0)
Monocytes Relative: 8.5 % (ref 3.0–12.0)
Neutro Abs: 2.7 10*3/uL (ref 1.4–7.7)
Neutrophils Relative %: 60.8 % (ref 43.0–77.0)
Platelets: 154 10*3/uL (ref 150.0–400.0)
RBC: 4.86 Mil/uL (ref 4.22–5.81)
RDW: 12.6 % (ref 11.5–15.5)
WBC: 4.4 10*3/uL (ref 4.0–10.5)

## 2023-11-11 LAB — BASIC METABOLIC PANEL WITH GFR
BUN: 14 mg/dL (ref 6–23)
CO2: 29 meq/L (ref 19–32)
Calcium: 9.4 mg/dL (ref 8.4–10.5)
Chloride: 101 meq/L (ref 96–112)
Creatinine, Ser: 0.65 mg/dL (ref 0.40–1.50)
GFR: 96.96 mL/min (ref >60.00)
Glucose, Bld: 119 mg/dL — ABNORMAL HIGH (ref 70–99)
Potassium: 4.3 meq/L (ref 3.5–5.1)
Sodium: 136 meq/L (ref 135–145)

## 2023-11-11 LAB — HEMOGLOBIN A1C: Hgb A1c MFr Bld: 6.6 % — ABNORMAL HIGH (ref 4.6–6.5)

## 2023-11-14 ENCOUNTER — Other Ambulatory Visit: Payer: Self-pay | Admitting: Internal Medicine

## 2023-11-14 ENCOUNTER — Ambulatory Visit (INDEPENDENT_AMBULATORY_CARE_PROVIDER_SITE_OTHER): Admitting: Physician Assistant

## 2023-11-14 VITALS — BP 124/70 | HR 87 | Ht 71.0 in | Wt 175.0 lb

## 2023-11-14 DIAGNOSIS — E119 Type 2 diabetes mellitus without complications: Secondary | ICD-10-CM

## 2023-11-14 DIAGNOSIS — K760 Fatty (change of) liver, not elsewhere classified: Secondary | ICD-10-CM | POA: Insufficient documentation

## 2023-11-14 DIAGNOSIS — K5730 Diverticulosis of large intestine without perforation or abscess without bleeding: Secondary | ICD-10-CM | POA: Insufficient documentation

## 2023-11-14 DIAGNOSIS — K219 Gastro-esophageal reflux disease without esophagitis: Secondary | ICD-10-CM

## 2023-11-14 DIAGNOSIS — Z7984 Long term (current) use of oral hypoglycemic drugs: Secondary | ICD-10-CM

## 2023-11-14 DIAGNOSIS — Z8546 Personal history of malignant neoplasm of prostate: Secondary | ICD-10-CM | POA: Insufficient documentation

## 2023-11-14 DIAGNOSIS — Z8711 Personal history of peptic ulcer disease: Secondary | ICD-10-CM

## 2023-11-14 DIAGNOSIS — R197 Diarrhea, unspecified: Secondary | ICD-10-CM | POA: Diagnosis not present

## 2023-11-14 MED ORDER — ESOMEPRAZOLE MAGNESIUM 40 MG PO CPDR: DELAYED_RELEASE_CAPSULE | ORAL | 3 refills | Status: AC

## 2023-11-14 NOTE — Progress Notes (Signed)
 11/14/2023 Jeff Jenkins [id number] October 14, 1955  Referring provider: Zilphia Hilt, Estel* Primary GI doctor: Dr. Venice Gillis ( Dr. Sandrea Cruel)  ASSESSMENT AND PLAN:  GERD with history of peptic ulcer disease/peptic stricture 08/2020 EGD benign-appearing nonobstructing esophageal stenosis, erythematous mucosa biopsied, small hiatal hernia, normal duodenum bulb.  Non-H. pylori chronic gastritis 11/11/2023 Hgb 15.7 no anemia On nexium  daily and doing well Takes voltern 75 mg rarely, every 2-3 months, rare advil about the same, getting knee replacement May 19th ETOH beer 3-4 x a day, 2-3 beers - Refill Nexium  for 90 days to [facility]. - Provided information on reflux management.  Diarrhea Colonoscopy 2018 5 mm polyp hyperplastic, multiple small mouth diverticula left colon, internal hemorrhoids recall 2028 Status post cholecystectomy Suspected metformin  induced, will have diarrhea after taking the medication, none during the day, no nocturnal symptoms, no hematochezia - Advised taking metformin  with largest meal and minimal carbohydrates. - Consider dosage adjustment if symptoms persist.  History of prostate cancer S/p proctectomy 2011, no radiation  Type 2 diabetes 11/11/2023 A1c 6.6  Nonobstructing CAD Seen on CT scan, no CP, SOB  Diverticulosis No ongoing symptoms related to diverticulitis.  Reviewed recommendations to follow a high fiber diet or to use fiber supplements on a regular basis.   Using NSAIDs may be associated with a moderately increased risk of occurrence of any episode of diverticulitis and complicated diverticulitis.   Hepatic steatosis Seen on MRI abdomen 2020 for renal cyst, normal spleen 12/30/2022 total bilirubin 1.6, alk phos 95, AST 23, ALT 24 No previous elevated LFTs Hepatitis C - 12/30/2022 Monitor LFTs and CBC every 6 months Consider elastography Stop ETOH  Patient Care Team: Zilphia Hilt, Charyl Coppersmith, MD as PCP - General (Internal  Medicine) Sheryle Donning, MD as PCP - Cardiology (Cardiology) Candyce Champagne, MD as Consulting Physician (General Surgery) Homero Luster, MD as Attending Physician (Urology) Claiborne Crew, MD as Consulting Physician (Orthopedic Surgery) Princella Brooklyn, OD (Optometry)  HISTORY OF PRESENT ILLNESS: 68 y.o. male with a past medical history listed below presents for evaluation of reflux.  Previously known to Dr. Sandrea Cruel, last seen in the office 10/2018 for.  Discussed the use of AI scribe software for clinical note transcription with the patient, who gave verbal consent to proceed.  History of Present Illness   Jeff Ingerson "Athena Bland" is a 68 year old male who presents for a prescription refill of Nexium .  He takes Nexium  once daily, an hour before food, to manage his reflux, which is generally well-controlled. However, it occasionally flares up with late meals, spicy food, or beer consumption, during which he uses Tums or Pepcid  over the counter. He previously experienced dysphagia, prompting medical attention. No current chest pain, shortness of breath, melena, hematochezia, or nocturnal symptoms.  He has a history of diverticula and hemorrhoids identified during a colonoscopy in 2018. The hemorrhoids are currently asymptomatic. He is due for another colonoscopy in 2028 unless symptoms such as bleeding occur.  He has a history of prostate cancer treated with prostatectomy in 2011, with no subsequent radiation therapy. He also has diabetes managed with Synjardy , which contains metformin . He experiences diarrhea after taking this medication, particularly when consumed with carbohydrates. His last recorded blood sugar level was 6.6%.  He is scheduled for a knee replacement on May 19th. No current chest pain or shortness of breath.  He consumes beer three to four times a week, two to three beers per occasion.     He  reports that he  has never smoked. He has never used smokeless tobacco. He  reports current alcohol use of about 12.0 standard drinks of alcohol per week. He reports that he does not use drugs.  RELEVANT GI HISTORY, IMAGING AND LABS: Results   LABS HbA1c: 6.6% (11/10/2023)  DIAGNOSTIC Colonoscopy: Hyperplastic polyp, diverticula, hemorrhoids (2018)  PATHOLOGY Prostatectomy: Prostate adenocarcinoma, no adjuvant radiation therapy (2011)      CBC    Component Value Date/Time   WBC 4.4 11/11/2023 1057   RBC 4.86 11/11/2023 1057   HGB 15.7 11/11/2023 1057   HGB 14.8 09/20/2020 1033   HCT 46.0 11/11/2023 1057   HCT 45.1 09/20/2020 1033   PLT 154.0 11/11/2023 1057   PLT 180 09/20/2020 1033   MCV 94.6 11/11/2023 1057   MCV 94 09/20/2020 1033   MCH 30.8 09/20/2020 1033   MCH 31.9 06/22/2020 1052   MCHC 34.2 11/11/2023 1057   RDW 12.6 11/11/2023 1057   RDW 12.3 09/20/2020 1033   LYMPHSABS 1.2 11/11/2023 1057   LYMPHSABS 1.1 09/20/2020 1033   MONOABS 0.4 11/11/2023 1057   EOSABS 0.2 11/11/2023 1057   EOSABS 0.1 09/20/2020 1033   BASOSABS 0.0 11/11/2023 1057   BASOSABS 0.0 09/20/2020 1033   Recent Labs    12/30/22 1430 11/11/23 1057  HGB 15.5 15.7    CMP     Component Value Date/Time   NA 136 11/11/2023 1057   NA 137 04/28/2019 1102   K 4.3 11/11/2023 1057   CL 101 11/11/2023 1057   CO2 29 11/11/2023 1057   GLUCOSE 119 (H) 11/11/2023 1057   BUN 14 11/11/2023 1057   BUN 11 04/28/2019 1102   CREATININE 0.65 11/11/2023 1057   CREATININE 0.74 06/22/2020 1052   CALCIUM  9.4 11/11/2023 1057   PROT 7.5 12/30/2022 1430   PROT 7.2 08/23/2019 1221   ALBUMIN 4.6 12/30/2022 1430   ALBUMIN 4.9 (H) 08/23/2019 1221   AST 23 12/30/2022 1430   ALT 24 12/30/2022 1430   ALKPHOS 95 12/30/2022 1430   BILITOT 1.6 (H) 12/30/2022 1430   BILITOT 0.9 08/23/2019 1221   GFRNONAA >60 01/27/2020 1110   GFRAA >60 01/27/2020 1110      Latest Ref Rng & Units 12/30/2022    2:30 PM 12/26/2021    9:12 AM 06/22/2020   10:52 AM  Hepatic Function  Total Protein 6.0 -  8.3 g/dL 7.5  6.8  7.1   Albumin 3.5 - 5.2 g/dL 4.6  4.5    AST 0 - 37 U/L 23  17  17    ALT 0 - 53 U/L 24  18  19    Alk Phosphatase 39 - 117 U/L 95  97    Total Bilirubin 0.2 - 1.2 mg/dL 1.6  0.7  1.2       Current Medications:   Current Outpatient Medications (Endocrine & Metabolic):    SYNJARDY  XR 25-1000 MG TB24, TAKE 1 TABLET BY MOUTH EVERY DAY   Testosterone  30 MG/ACT SOLN, Place onto the skin daily.   empagliflozin  (JARDIANCE ) 25 MG TABS tablet, Take 1 tablet (25 mg total) by mouth daily before breakfast. (Patient not taking: Reported on 09/10/2023)   metformin  (FORTAMET ) 1000 MG (OSM) 24 hr tablet, Take 1,000 mg by mouth daily with breakfast.  Current Outpatient Medications (Cardiovascular):    atorvastatin  (LIPITOR) 40 MG tablet, TAKE 1 TABLET BY MOUTH EVERY DAY   ezetimibe  (ZETIA ) 10 MG tablet, TAKE 1 TABLET BY MOUTH DAILY   losartan  (COZAAR ) 25 MG tablet, TAKE 1 TABLET (  25 MG TOTAL) BY MOUTH DAILY.   sildenafil (VIAGRA) 100 MG tablet, TAKE 1 TABLET BY MOUTH DAILY AS NEEDED   tadalafil (CIALIS) 5 MG tablet, Take 5 mg by mouth daily as needed for erectile dysfunction.   Current Outpatient Medications (Respiratory):    Azelastine  HCl 137 MCG/SPRAY SOLN, PLACE 1 SPRAY INTO BOTH NOSTRILS 2 (TWO) TIMES DAILY   cetirizine (ZYRTEC) 10 MG chewable tablet, Chew 10 mg by mouth daily.   ipratropium (ATROVENT ) 0.02 % nebulizer solution, Take 2.5 mLs (0.5 mg total) by nebulization 4 (four) times daily.   fexofenadine (ALLEGRA) 180 MG tablet, Take 180 mg by mouth daily.  Current Outpatient Medications (Analgesics):    acetaminophen  (TYLENOL ) 325 MG tablet, Take 650 mg by mouth every 6 (six) hours as needed for moderate pain.   aspirin EC 81 MG tablet, Take 81 mg by mouth daily. Swallow whole.   diclofenac (VOLTAREN) 75 MG EC tablet, Take 75 mg by mouth 2 (two) times daily as needed.  Current Outpatient Medications (Hematological):    cyanocobalamin  (VITAMIN B12) 500 MCG tablet, Take 500  mcg by mouth daily.  Current Outpatient Medications (Other):    Calcium  Carbonate Antacid (TUMS PO), Take 2 tablets by mouth as needed.   Lancets (ONETOUCH ULTRASOFT) lancets, 1 each by Other route daily. Dx E11.9   ONETOUCH ULTRA test strip, CHECK BLOOD SUGAR DAILY   dicyclomine  (BENTYL ) 10 MG capsule, TAKE 1 CAPSULE (10 MG TOTAL) BY MOUTH 3 (THREE) TIMES DAILY AS NEEDED FOR SPASMS. (Patient not taking: Reported on 09/10/2023)   esomeprazole  (NEXIUM ) 40 MG capsule, TAKE 1 CAPSULE BY MOUTH DAILY BEFORE BREAKFAST.  Medical History:  Past Medical History:  Diagnosis Date   Allergy    Arthritis    Broken ankle 2008   Coronary artery disease    Diverticulosis    ED (erectile dysfunction)    Facial fracture (HCC) 1979   GERD (gastroesophageal reflux disease)    Hearing loss in right ear    Hemorrhoids    Hiatal hernia    Hyperlipidemia    Hypertension    Peptic stricture of esophagus    Peptic ulcer disease 1989   Prostate cancer (HCC) 02/2009   Seasonal allergies    Type 2 diabetes mellitus (HCC)    Allergies: No Known Allergies   Surgical History:  He  has a past surgical history that includes Inguinal hernia repair (Left); Ankle surgery (Left, 2008); Prostatectomy (2011); Knee arthroscopy (Left, 2013); Vasectomy; Facial fracture surgery; Tonsillectomy; Cholecystectomy (N/A, 01/31/2020); Knee arthroscopy (07/03/2020); Upper gastrointestinal endoscopy; and Hernia repair (09/26/2021). Family History:  His family history includes Alcohol abuse in his father; Breast cancer in his mother; Diabetes in his mother; Heart attack in his mother; Stroke in his brother.  REVIEW OF SYSTEMS  : All other systems reviewed and negative except where noted in the History of Present Illness.  PHYSICAL EXAM: BP 124/70   Pulse 87   Ht 5\' 11"  (1.803 m)   Wt 175 lb (79.4 kg)   BMI 24.41 kg/m  Physical Exam   GENERAL APPEARANCE: Well nourished, in no apparent distress. HEENT: No cervical  lymphadenopathy, unremarkable thyroid , sclerae anicteric, conjunctiva pink. RESPIRATORY: Respiratory effort normal, breath sounds clear and equal bilaterally without rales, rhonchi, or wheezing. CARDIO: Regular rate and rhythm with no murmurs, rubs, or gallops, peripheral pulses intact. ABDOMEN: Soft, non-distended, active bowel sounds in all four quadrants, no tenderness to palpation, no rebound, no mass appreciated. RECTAL: Declines. MUSCULOSKELETAL: Full range of motion, normal  gait, without edema. SKIN: Dry, intact without rashes or lesions. No jaundice. NEURO: Alert, oriented, no focal deficits. PSYCH: Cooperative, normal mood and affect.      Jeff Gottron, PA-C 11:10 AM

## 2023-11-14 NOTE — Patient Instructions (Addendum)
 Please take your proton pump inhibitor medication, Nexium  40 mg  Please take this medication 30 minutes to 1 hour before meals- this makes it more effective.  Can take pepcid  as needed Avoid spicy and acidic foods Avoid fatty foods Limit your intake of coffee, tea, alcohol, and carbonated drinks Work to maintain a healthy weight Keep the head of the bed elevated at least 3 inches with blocks or a wedge pillow if you are having any nighttime symptoms Stay upright for 2 hours after eating Avoid meals and snacks three to four hours before bedtime  Diverticulosis Diverticulosis is a condition that develops when small pouches (diverticula) form in the wall of the large intestine (colon). The colon is where water is absorbed and stool (feces) is formed. The pouches form when the inside layer of the colon pushes through weak spots in the outer layers of the colon. You may have a few pouches or many of them. The pouches usually do not cause problems unless they become inflamed or infected. When this happens, the condition is called diverticulitis- this is left lower quadrant pain, diarrhea, fever, chills, nausea or vomiting.  If this occurs please call the office or go to the hospital. Sometimes these patches without inflammation can also have painless bleeding associated with them, if this happens please call the office or go to the hospital. Preventing constipation and increasing fiber can help reduce diverticula and prevent complications. Even if you feel you have a high-fiber diet, suggest getting on Benefiber or Cirtracel 2 times daily.  In order to create energy your cells need insulin and sugar but sometime your cells do not accept the insulin and this can cause increased sugars and decreased energy.   The Metformin  helps your cells accept insulin and the sugar this help: 1) increase your energy  2) weight loss.    The two most common side effects are nausea and diarrhea, follow these rules to  avoid it but these symptoms get better with time on the medication.    ALSO You can take imodium per box instructions when starting metformin  if needed.   Rules of metformin : 1) start out slow with only one pill daily. Our goal for you is 4 pills a day or 2000mg  total.  2) take with your largest meal. 3) Take with least amount of carbs.   Call if you have any problems.   Thank you for trusting me with your gastrointestinal care!   Santina Cull, PA-C  _______________________________________________________  If your blood pressure at your visit was 140/90 or greater, please contact your primary care physician to follow up on this.  _______________________________________________________  If you are age 68 or older, your body mass index should be between 23-30. Your Body mass index is 24.41 kg/m. If this is out of the aforementioned range listed, please consider follow up with your Primary Care Provider.  If you are age 68 or younger, your body mass index should be between 19-25. Your Body mass index is 24.41 kg/m. If this is out of the aformentioned range listed, please consider follow up with your Primary Care Provider.   ________________________________________________________  The [facility] providers would like to encourage you to use MYCHART to communicate with providers for non-urgent requests or questions.  Due to long hold times on the telephone, sending your provider a message by [facility] may be a faster and more efficient way to get a response.  Please allow 48 business hours for a response.  Please remember that  this is for non-urgent requests.  _______________________________________________________

## 2023-11-18 ENCOUNTER — Ambulatory Visit (INDEPENDENT_AMBULATORY_CARE_PROVIDER_SITE_OTHER): Payer: Medicare HMO | Admitting: Family Medicine

## 2023-11-18 DIAGNOSIS — Z0000 Encounter for general adult medical examination without abnormal findings: Secondary | ICD-10-CM

## 2023-11-18 NOTE — Progress Notes (Signed)
 PATIENT CHECK-IN and HEALTH RISK ASSESSMENT QUESTIONNAIRE:  -completed by phone/video for upcoming Medicare Preventive Visit  Pre-Visit Check-in: 1)Vitals (height, wt, BP, etc) - record in vitals section for visit on day of visit Request home vitals (wt, BP, etc.) and enter into vitals, THEN update Vital Signs SmartPhrase below at the top of the HPI. See below.  2)Review and Update Medications, Allergies PMH, Surgeries, Social history in Epic 3)Hospitalizations in the last year with date/reason? n  4)Review and Update Care Team (patient's specialists) in Epic 5) Complete PHQ9 in Epic  6) Complete Fall Screening in Epic 7)Review all Health Maintenance Due and order under PCP if not done.  Medicare Wellness Patient Questionnaire:  Answer theses question about your habits: How often do you have a drink containing alcohol? 2-3 days How many drinks containing alcohol do you have on a typical day when you are drinking?2-3 beers How often do you have six or more drinks on one occasion? Maybe 1x per week Have you ever smoked? n How many packs a day do/did you smoke? na Do you use smokeless tobacco?n Do you use an illicit drugs?n On average, how many days per week do you engage in moderate to strenuous exercise (like a brisk walk)? 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 Has hearing aids, has hearing test this week Do you feel that you have a problem with memory?n Do you feel safe at home?yn Last dentist visit? Last week 8. Do you have any difficulty performing your everyday  activities?n Are you having any difficulty walking, taking medications on your own, and or difficulty managing daily home needs?n Do you have difficulty walking or climbing stairs?n Do you have difficulty dressing or bathing?n Do you have difficulty doing errands alone such as visiting a doctor's office or shopping?n Do you currently have any difficulty preparing food and eating?n Do you currently have any difficulty using the toilet?n Do you have any difficulty managing your finances?n Do you have any difficulties with housekeeping of managing your housekeeping?n   Do you have Advanced Directives in place (Living Will, Healthcare Power or Attorney)? y   Last eye Exam and location? Getting ready to schedule his eye exam with Dr. Annabell Key   Do you currently use prescribed or non-prescribed narcotic or opioid pain medications?n     ----------------------------------------------------------------------------------------------------------------------------------------------------------------------------------------------------------------------  Because this visit was a virtual/telehealth visit, some criteria may be missing or patient reported. Any vitals not documented were not able to be obtained and vitals that have been documented are patient reported.    MEDICARE ANNUAL PREVENTIVE CARE VISIT WITH PROVIDER (Welcome to Medicare, initial annual wellness or annual wellness exam)  Virtual Visit via Phone Note  I connected with Tejas Hariri on 11/18/23  by phone and verified that I am speaking with the correct person using two identifiers. He prefers phone.   Location patient: home Location provider:work or home office Persons participating in the virtual visit: patient, provider  Concerns and/or follow up today: having knee surgery in May. Doing well otherwise.    See HM section in Epic for other details of completed HM.  ROS: negative for report of fevers, unintentional  weight loss, vision changes, vision loss, hearing loss or change, chest pain, sob, hemoptysis, melena, hematochezia, hematuria, falls, bleeding or bruising, thoughts of suicide or self harm, memory loss  Patient-completed extensive health risk assessment - reviewed and discussed with the patient: See Health Risk Assessment completed with patient prior to the visit either above or in recent phone note. This was reviewed in detailed with the patient today and appropriate recommendations, orders and referrals were placed as needed per Summary below and patient instructions.   Review of Medical History: -PMH, PSH, Family History and current specialty and care providers reviewed and updated and listed below   Patient Care Team: Zilphia Hilt, Charyl Coppersmith, MD as PCP - General (Internal Medicine) Sheryle Donning, MD as PCP - Cardiology (Cardiology) Candyce Champagne, MD as Consulting Physician (General Surgery) Homero Luster, MD as Attending Physician (Urology) Claiborne Crew, MD as Consulting Physician (Orthopedic Surgery) Princella Brooklyn, OD (Optometry)   Past Medical History:  Diagnosis Date   Allergy    Arthritis    Broken ankle 2008   Coronary artery disease    Diverticulosis    ED (erectile dysfunction)    Facial fracture (HCC) 1979   GERD (gastroesophageal reflux disease)    Hearing loss in right ear    Hemorrhoids    Hiatal hernia    Hyperlipidemia    Hypertension    Peptic stricture of esophagus    Peptic ulcer disease 1989   Prostate cancer (HCC) 02/2009   Seasonal allergies    Type 2 diabetes mellitus (HCC)     Past Surgical History:  Procedure Laterality Date   ANKLE SURGERY Left 2008   CHOLECYSTECTOMY N/A 01/31/2020   Procedure: LAPAROSCOPIC CHOLECYSTECTOMY WITH INTRAOPERATIVE CHOLANGIOGRAM;  Surgeon: Adalberto Acton, MD;  Location: [facility];  Service: General;  Laterality: N/A;   FACIAL FRACTURE SURGERY     HERNIA REPAIR  09/26/2021   Sugery on 4 hernias in groin  area  [facility]   INGUINAL HERNIA REPAIR Left    KNEE ARTHROSCOPY Left 2013   KNEE ARTHROSCOPY  07/03/2020   [facility]   PROSTATECTOMY  2011   TONSILLECTOMY     UPPER GASTROINTESTINAL ENDOSCOPY     VASECTOMY      Social History   Socioeconomic History   Marital status: Married    Spouse name: Not on file   Number of children: 2   Years of education: Not on file   Highest education level: Some college, no degree  Occupational History   Occupation: Airline pilot  Tobacco Use   Smoking status: Never   Smokeless tobacco: Never  Vaping Use   Vaping status: Never Used  Substance and Sexual Activity   Alcohol use: Yes    Alcohol/week: 12.0 standard drinks of alcohol    Types: 12 Standard drinks or equivalent per week    Comment: social    Drug use: No   Sexual activity: Yes    Partners: Female  Other Topics Concern   Not on file  Social History Narrative   Not on file   Social Drivers of Health   Financial Resource Strain: Low Risk  (11/14/2023)   Overall Financial Resource Strain (CARDIA)    Difficulty of Paying Living Expenses: Not hard at all  Food Insecurity: No Food Insecurity (11/14/2023)   Hunger Vital Sign    Worried About Running Out of Food in the Last Year: Never true    Ran Out  of Food in the Last Year: Never true  Transportation Needs: No Transportation Needs (11/14/2023)   PRAPARE - Administrator, Civil Service (Medical): No    Lack of Transportation (Non-Medical): No  Physical Activity: Insufficiently Active (11/14/2023)   Exercise Vital Sign    Days of Exercise per Week: 4 days    Minutes of Exercise per Session: 20 min  Stress: No Stress Concern Present (11/14/2023)   [employer] of Occupational Health - Occupational Stress Questionnaire    Feeling of Stress : Not at all  Social Connections: Unknown (11/14/2023)   Social Connection and Isolation Panel [NHANES]    Frequency of Communication with Friends and Family: More than three  times a week    Frequency of Social Gatherings with Friends and Family: More than three times a week    Attends Religious Services: Patient declined    Database administrator or Organizations: Yes    Attends Engineer, structural: More than 4 times per year    Marital Status: Married  Catering manager Violence: Not At Risk (12/26/2022)   Humiliation, Afraid, Rape, and Kick questionnaire    Fear of Current or Ex-Partner: No    Emotionally Abused: No    Physically Abused: No    Sexually Abused: No    Family History  Problem Relation Age of Onset   Breast cancer Mother    Diabetes Mother    Heart attack Mother    Alcohol abuse Father    Stroke Brother    Colon cancer Neg Hx    Esophageal cancer Neg Hx    Rectal cancer Neg Hx    Stomach cancer Neg Hx     Current Outpatient Medications on File Prior to Visit  Medication Sig Dispense Refill   acetaminophen  (TYLENOL ) 325 MG tablet Take 650 mg by mouth every 6 (six) hours as needed for moderate pain.     aspirin EC 81 MG tablet Take 81 mg by mouth daily. Swallow whole.     atorvastatin  (LIPITOR) 40 MG tablet TAKE 1 TABLET BY MOUTH EVERY DAY 90 tablet 1   Azelastine  HCl 137 MCG/SPRAY SOLN PLACE 1 SPRAY INTO BOTH NOSTRILS 2 (TWO) TIMES DAILY 90 mL 2   Calcium  Carbonate Antacid (TUMS PO) Take 2 tablets by mouth as needed.     cetirizine (ZYRTEC) 10 MG chewable tablet Chew 10 mg by mouth daily.     cyanocobalamin  (VITAMIN B12) 500 MCG tablet Take 500 mcg by mouth daily.     diclofenac (VOLTAREN) 75 MG EC tablet Take 75 mg by mouth 2 (two) times daily as needed.     dicyclomine  (BENTYL ) 10 MG capsule TAKE 1 CAPSULE (10 MG TOTAL) BY MOUTH 3 (THREE) TIMES DAILY AS NEEDED FOR SPASMS. (Patient not taking: Reported on 09/10/2023) 270 capsule 5   empagliflozin  (JARDIANCE ) 25 MG TABS tablet Take 1 tablet (25 mg total) by mouth daily before breakfast. (Patient not taking: Reported on 09/10/2023) 28 tablet 0   esomeprazole  (NEXIUM ) 40 MG  capsule TAKE 1 CAPSULE BY MOUTH DAILY BEFORE BREAKFAST. 90 capsule 3   ezetimibe  (ZETIA ) 10 MG tablet TAKE 1 TABLET BY MOUTH DAILY 90 tablet 0   fexofenadine (ALLEGRA) 180 MG tablet Take 180 mg by mouth daily.     ipratropium (ATROVENT ) 0.02 % nebulizer solution Take 2.5 mLs (0.5 mg total) by nebulization 4 (four) times daily. 75 mL 12   Lancets (ONETOUCH ULTRASOFT) lancets 1 each by Other route daily. Dx E11.9 100  each 3   losartan  (COZAAR ) 25 MG tablet TAKE 1 TABLET (25 MG TOTAL) BY MOUTH DAILY. 90 tablet 3   metformin  (FORTAMET ) 1000 MG (OSM) 24 hr tablet Take 1,000 mg by mouth daily with breakfast.     ONETOUCH ULTRA test strip CHECK BLOOD SUGAR DAILY 100 strip 3   sildenafil (VIAGRA) 100 MG tablet TAKE 1 TABLET BY MOUTH DAILY AS NEEDED 10 tablet 11   SYNJARDY  XR 25-1000 MG TB24 TAKE 1 TABLET BY MOUTH EVERY DAY 90 tablet 1   tadalafil (CIALIS) 5 MG tablet Take 5 mg by mouth daily as needed for erectile dysfunction.      Testosterone  30 MG/ACT SOLN Place onto the skin daily.     No current facility-administered medications on file prior to visit.    No Known Allergies     Physical Exam Vitals requested from patient and listed below if patient had equipment and was able to obtain at home for this virtual visit: There were no vitals filed for this visit. Estimated body mass index is 24.41 kg/m as calculated from the following:   Height as of 11/14/23: 5\' 11"  (1.803 m).   Weight as of 11/14/23: 175 lb (79.4 kg).  EKG (optional): deferred due to virtual visit  GENERAL: alert, oriented, no acute distress detected; full vision exam deferred due to pandemic and/or virtual encounter  PSYCH/NEURO: pleasant and cooperative, no obvious depression or anxiety, speech and thought processing grossly intact, Cognitive function grossly intact  Flowsheet Row Office Visit from 12/30/2022 in [facility] [place]  PHQ-9 Total Score 0           11/18/2023   10:51 AM 12/30/2022     1:56 PM 12/26/2022    2:55 PM 05/30/2022    1:06 PM 12/26/2021    8:42 AM  Depression screen PHQ 2/9  Decreased Interest 0 0 0 0 0  Down, Depressed, Hopeless 0 0 0 0 0  PHQ - 2 Score 0 0 0 0 0  Altered sleeping  0  0 0  Tired, decreased energy  0  0 1  Change in appetite  0  0 0  Feeling bad or failure about yourself   0  0 0  Trouble concentrating  0  0 0  Moving slowly or fidgety/restless  0  0 0  Suicidal thoughts  0  0 0  PHQ-9 Score  0  0 1  Difficult doing work/chores  Not difficult at all  Not difficult at all Not difficult at all       06/26/2021   10:43 AM 09/25/2021   11:45 AM 05/30/2022    1:06 PM 12/26/2022    2:59 PM 11/18/2023   10:51 AM  Fall Risk  Falls in the past year? 0 0 0 0 0  Was there an injury with Fall?  0 0 0 0  Fall Risk Category Calculator  0 0 0 0  Fall Risk Category (Retired)  Low Low    (RETIRED) Patient Fall Risk Level Low fall risk  Low fall risk    Patient at Risk for Falls Due to   No Fall Risks    Fall risk Follow up  Falls evaluation completed Falls evaluation completed Falls evaluation completed      SUMMARY AND PLAN:  Encounter for annual wellness exam in Medicare patient  Discussed applicable health maintenance/preventive health measures and advised and referred or ordered per patient preferences: -urologist does PSA for him -discussed covid  vaccines recs, advised can get at the pharmacy if wishes to get Health Maintenance  Topic Date Due   COVID-19 Vaccine (6 - 2024-25 season) 03/16/2023   Diabetic kidney evaluation - Urine ACR  12/30/2023   FOOT EXAM  12/30/2023   OPHTHALMOLOGY EXAM  01/03/2024   INFLUENZA VACCINE  02/13/2024   HEMOGLOBIN A1C  05/12/2024   Diabetic kidney evaluation - eGFR measurement  11/10/2024   Medicare Annual Wellness (AWV)  11/17/2024   Pneumonia Vaccine 48+ Years old (3 of 3 - PCV20 or PCV21) 12/22/2025   Colonoscopy  01/28/2027   DTaP/Tdap/Td (3 - Td or Tdap) 02/10/2029   Hepatitis C Screening   Completed   Zoster Vaccines- Shingrix  Completed   HPV VACCINES  Aged Out   Meningococcal B Vaccine  Aged Out    Education and counseling on the following was provided based on the above review of health and a plan/checklist for the patient, along with additional information discussed, was provided for the patient in the patient instructions :  -Advised and counseled on a healthy lifestyle - including the importance of a healthy diet, regular physical activity, social connections  -Reviewed patient's current diet. Advised and counseled on a whole foods based healthy diet. Encouraged to cut back on sweetened beverages and added artificial sweeteners. Also discussed healthier choices when eating out.  A summary of a healthy diet was provided in the Patient Instructions.  -reviewed patient's current physical activity level and discussed exercise guidelines for adults. Discussed community resources and ideas for safe exercise at home to assist in meeting exercise guideline recommendations in a safe and healthy way.  -Advise yearly dental visits at minimum and regular eye exams -Advised and counseled on alcohol safe limits, risks and advised no more than 2 drinks in any given day; since getting ready for surgery discussed risks with opioids.  Follow up: see patient instructions   Patient Instructions  I really enjoyed getting to talk with you today! I am available on Tuesdays and Thursdays for virtual visits if you have any questions or concerns, or if I can be of any further assistance.   CHECKLIST FROM ANNUAL WELLNESS VISIT:  -Follow up (please call to schedule if not scheduled after visit):   -yearly for annual wellness visit with primary care office  Here is a list of your preventive care/health maintenance measures and the plan for each if any are due:  PLAN For any measures below that may be due:   Health Maintenance  Topic Date Due   COVID-19 Vaccine (6 - 2024-25 season) 03/16/2023    Diabetic kidney evaluation - Urine ACR  12/30/2023   FOOT EXAM  12/30/2023   OPHTHALMOLOGY EXAM  01/03/2024   INFLUENZA VACCINE  02/13/2024   HEMOGLOBIN A1C  05/12/2024   Diabetic kidney evaluation - eGFR measurement  11/10/2024   Medicare Annual Wellness (AWV)  11/17/2024   Pneumonia Vaccine 33+ Years old (3 of 3 - PCV20 or PCV21) 12/22/2025   Colonoscopy  01/28/2027   DTaP/Tdap/Td (3 - Td or Tdap) 02/10/2029   Hepatitis C Screening  Completed   Zoster Vaccines- Shingrix  Completed   HPV VACCINES  Aged Out   Meningococcal B Vaccine  Aged Out    -See a dentist at least yearly  -Get your eyes checked and then per your eye specialist's recommendations  -Other issues addressed today:   Please cut back on alcohol to no more than 2 drinks in any given day  -I have included  below further information regarding a healthy whole foods based diet, physical activity guidelines for adults, stress management and opportunities for social connections. I hope you find this information useful.   -----------------------------------------------------------------------------------------------------------------------------------------------------------------------------------------------------------------------------------------------------------    NUTRITION: -eat real food: lots of colorful vegetables (half the plate) and fruits -5-7 servings of vegetables and fruits per day (fresh or steamed is best), exp. 2 servings of vegetables with lunch and dinner and 2 servings of fruit per day. Berries and greens such as kale and collards are great choices.  -consume on a regular basis:  fresh fruits, fresh veggies, fish, nuts, seeds, healthy oils (such as olive oil, avocado oil), whole grains (make sure for bread/pasta/crackers/etc., that the first ingredient on label contains the word "whole"), legumes. -can eat small amounts of dairy and lean meat (no larger than the palm of your hand), but avoid processed  meats such as ham, bacon, lunch meat, etc. -drink water -try to avoid fast food and pre-packaged foods, processed meat, ultra processed foods/beverages (donuts, candy, etc.) -most experts advise limiting sodium to < 2300mg  per day, should limit further is any chronic conditions such as high blood pressure, heart disease, diabetes, etc. The American Heart Association advised that < 1500mg  is is ideal -try to avoid foods/beverages that contain any ingredients with names you do not recognize  -try to avoid foods/beverages  with added sugar or sweeteners/sweets  -try to avoid sweet drinks (including diet drinks): soda, juice, Gatorade, sweet tea, power drinks, diet drinks -try to avoid white rice, white bread, pasta (unless whole grain)  EXERCISE GUIDELINES FOR ADULTS: -if you wish to increase your physical activity, do so gradually and with the approval of your doctor -STOP and seek medical care immediately if you have any chest pain, chest discomfort or trouble breathing when starting or increasing exercise  -move and stretch your body, legs, feet and arms when sitting for long periods -Physical activity guidelines for optimal health in adults: -get at least 150 minutes per week of moderate exercise (can talk, but not sing); this is about 20-30 minutes of sustained activity 5-7 days per week or two 10-15 minute episodes of sustained activity 5-7 days per week -do some muscle building/resistance training/strength training at least 2 days per week  -balance exercises 3+ days per week:   Stand somewhere where you have something sturdy to hold onto if you lose balance    1) lift up on toes, then back down, start with 5x per day and work up to 20x   2) stand and lift one leg straight out to the side so that foot is a few inches of the floor, start with 5x each side and work up to 20x each side   3) stand on one foot, start with 5 seconds each side and work up to 20 seconds on each side  If you need  ideas or help with getting more active:  -Silver sneakers https://tools.silversneakers.com  -Walk with a Doc: http://www.duncan-williams.com/  -try to include resistance (weight lifting/strength building) and balance exercises twice per week: or the following link for ideas: http://castillo-powell.com/  BuyDucts.dk  STRESS MANAGEMENT: -can try meditating, or just sitting quietly with deep breathing while intentionally relaxing all parts of your body for 5 minutes daily -if you need further help with stress, anxiety or depression please follow up with your primary doctor or contact the wonderful folks at [facility]: [phone]  SOCIAL CONNECTIONS: -options in [place] if you wish to engage in more social and exercise related activities:  -  Silver sneakers https://tools.silversneakers.com  -Walk with a Doc: http://www.duncan-williams.com/  -Check out the [facility] Active Adults 50+ section on the [place] of [employer] (hiking clubs, book clubs, cards and games, chess, exercise classes, aquatic classes and much more) - see the website for details: https://www.[place].gov/departments/parks-recreation/active-adults50  -YouTube has lots of exercise videos for different ages and abilities as well  -[facility] (a variety of indoor and outdoor inperson activities for adults). [phone]. [address]  -Virtual Online Classes (a variety of topics): see seniorplanet.org or call ([phone])[phone]  -consider volunteering at a school, hospice center, church, senior center or elsewhere            Maurie Southern, DO

## 2023-11-18 NOTE — Patient Instructions (Addendum)
 I really enjoyed getting to talk with you today! I am available on Tuesdays and Thursdays for virtual visits if you have any questions or concerns, or if I can be of any further assistance.   CHECKLIST FROM ANNUAL WELLNESS VISIT:  -Follow up (please call to schedule if not scheduled after visit):   -yearly for annual wellness visit with primary care office  Here is a list of your preventive care/health maintenance measures and the plan for each if any are due:  PLAN For any measures below that may be due:   Health Maintenance  Topic Date Due   COVID-19 Vaccine (6 - 2024-25 season) 03/16/2023   Diabetic kidney evaluation - Urine ACR  12/30/2023   FOOT EXAM  12/30/2023   OPHTHALMOLOGY EXAM  01/03/2024   INFLUENZA VACCINE  02/13/2024   HEMOGLOBIN A1C  05/12/2024   Diabetic kidney evaluation - eGFR measurement  11/10/2024   Medicare Annual Wellness (AWV)  11/17/2024   Pneumonia Vaccine 29+ Years old (3 of 3 - PCV20 or PCV21) 12/22/2025   Colonoscopy  01/28/2027   DTaP/Tdap/Td (3 - Td or Tdap) 02/10/2029   Hepatitis C Screening  Completed   Zoster Vaccines- Shingrix  Completed   HPV VACCINES  Aged Out   Meningococcal B Vaccine  Aged Out    -See a dentist at least yearly  -Get your eyes checked and then per your eye specialist's recommendations  -Other issues addressed today:   Please cut back on alcohol to no more than 2 drinks in any given day  -I have included below further information regarding a healthy whole foods based diet, physical activity guidelines for adults, stress management and opportunities for social connections. I hope you find this information useful.   -----------------------------------------------------------------------------------------------------------------------------------------------------------------------------------------------------------------------------------------------------------    NUTRITION: -eat real food: lots of colorful vegetables  (half the plate) and fruits -5-7 servings of vegetables and fruits per day (fresh or steamed is best), exp. 2 servings of vegetables with lunch and dinner and 2 servings of fruit per day. Berries and greens such as kale and collards are great choices.  -consume on a regular basis:  fresh fruits, fresh veggies, fish, nuts, seeds, healthy oils (such as olive oil, avocado oil), whole grains (make sure for bread/pasta/crackers/etc., that the first ingredient on label contains the word "whole"), legumes. -can eat small amounts of dairy and lean meat (no larger than the palm of your hand), but avoid processed meats such as ham, bacon, lunch meat, etc. -drink water -try to avoid fast food and pre-packaged foods, processed meat, ultra processed foods/beverages (donuts, candy, etc.) -most experts advise limiting sodium to < 2300mg  per day, should limit further is any chronic conditions such as high blood pressure, heart disease, diabetes, etc. The American Heart Association advised that < 1500mg  is is ideal -try to avoid foods/beverages that contain any ingredients with names you do not recognize  -try to avoid foods/beverages  with added sugar or sweeteners/sweets  -try to avoid sweet drinks (including diet drinks): soda, juice, Gatorade, sweet tea, power drinks, diet drinks -try to avoid white rice, white bread, pasta (unless whole grain)  EXERCISE GUIDELINES FOR ADULTS: -if you wish to increase your physical activity, do so gradually and with the approval of your doctor -STOP and seek medical care immediately if you have any chest pain, chest discomfort or trouble breathing when starting or increasing exercise  -move and stretch your body, legs, feet and arms when sitting for long periods -Physical activity guidelines for optimal health  in adults: -get at least 150 minutes per week of moderate exercise (can talk, but not sing); this is about 20-30 minutes of sustained activity 5-7 days per week or two  10-15 minute episodes of sustained activity 5-7 days per week -do some muscle building/resistance training/strength training at least 2 days per week  -balance exercises 3+ days per week:   Stand somewhere where you have something sturdy to hold onto if you lose balance    1) lift up on toes, then back down, start with 5x per day and work up to 20x   2) stand and lift one leg straight out to the side so that foot is a few inches of the floor, start with 5x each side and work up to 20x each side   3) stand on one foot, start with 5 seconds each side and work up to 20 seconds on each side  If you need ideas or help with getting more active:  -Silver sneakers https://tools.silversneakers.com  -Walk with a Doc: http://www.duncan-williams.com/  -try to include resistance (weight lifting/strength building) and balance exercises twice per week: or the following link for ideas: http://castillo-powell.com/  BuyDucts.dk  STRESS MANAGEMENT: -can try meditating, or just sitting quietly with deep breathing while intentionally relaxing all parts of your body for 5 minutes daily -if you need further help with stress, anxiety or depression please follow up with your primary doctor or contact the wonderful folks at [facility]: [phone]  SOCIAL CONNECTIONS: -options in [place] if you wish to engage in more social and exercise related activities:  -Silver sneakers https://tools.silversneakers.com  -Walk with a Doc: http://www.duncan-williams.com/  -Check out the [facility] Active Adults 50+ section on the [place] of [employer] (hiking clubs, book clubs, cards and games, chess, exercise classes, aquatic classes and much more) - see the website for details: https://www.[place].gov/departments/parks-recreation/active-adults50  -YouTube has lots of exercise videos for different ages and  abilities as well  -[facility] (a variety of indoor and outdoor inperson activities for adults). [phone]. [address].  -Virtual Online Classes (a variety of topics): see seniorplanet.org or call [phone]  -consider volunteering at a school, hospice center, church, senior center or elsewhere

## 2023-12-01 DIAGNOSIS — M1712 Unilateral primary osteoarthritis, left knee: Secondary | ICD-10-CM | POA: Diagnosis not present

## 2023-12-01 DIAGNOSIS — G8918 Other acute postprocedural pain: Secondary | ICD-10-CM | POA: Diagnosis not present

## 2023-12-03 DIAGNOSIS — Z471 Aftercare following joint replacement surgery: Secondary | ICD-10-CM | POA: Diagnosis not present

## 2023-12-03 DIAGNOSIS — M1712 Unilateral primary osteoarthritis, left knee: Secondary | ICD-10-CM | POA: Diagnosis not present

## 2023-12-03 DIAGNOSIS — M6281 Muscle weakness (generalized): Secondary | ICD-10-CM | POA: Diagnosis not present

## 2023-12-09 ENCOUNTER — Encounter: Admit: 2023-12-09 | Discharge: 2023-12-09 | Payer: MEDICARE

## 2023-12-11 DIAGNOSIS — M6281 Muscle weakness (generalized): Secondary | ICD-10-CM | POA: Diagnosis not present

## 2023-12-11 DIAGNOSIS — Z471 Aftercare following joint replacement surgery: Secondary | ICD-10-CM | POA: Diagnosis not present

## 2023-12-11 DIAGNOSIS — M1712 Unilateral primary osteoarthritis, left knee: Secondary | ICD-10-CM | POA: Diagnosis not present

## 2023-12-16 DIAGNOSIS — M1712 Unilateral primary osteoarthritis, left knee: Secondary | ICD-10-CM | POA: Diagnosis not present

## 2023-12-16 DIAGNOSIS — M6281 Muscle weakness (generalized): Secondary | ICD-10-CM | POA: Diagnosis not present

## 2023-12-16 DIAGNOSIS — Z471 Aftercare following joint replacement surgery: Secondary | ICD-10-CM | POA: Diagnosis not present

## 2023-12-17 ENCOUNTER — Ambulatory Visit (HOSPITAL_BASED_OUTPATIENT_CLINIC_OR_DEPARTMENT_OTHER): Payer: Medicare HMO | Admitting: Cardiology

## 2023-12-18 DIAGNOSIS — M6281 Muscle weakness (generalized): Secondary | ICD-10-CM | POA: Diagnosis not present

## 2023-12-18 DIAGNOSIS — M1712 Unilateral primary osteoarthritis, left knee: Secondary | ICD-10-CM | POA: Diagnosis not present

## 2023-12-18 DIAGNOSIS — Z471 Aftercare following joint replacement surgery: Secondary | ICD-10-CM | POA: Diagnosis not present

## 2023-12-22 ENCOUNTER — Ambulatory Visit (HOSPITAL_BASED_OUTPATIENT_CLINIC_OR_DEPARTMENT_OTHER): Payer: Medicare HMO | Admitting: Cardiology

## 2023-12-23 ENCOUNTER — Encounter: Admit: 2023-12-23 | Discharge: 2023-12-23 | Payer: MEDICARE

## 2023-12-23 DIAGNOSIS — M1712 Unilateral primary osteoarthritis, left knee: Secondary | ICD-10-CM | POA: Diagnosis not present

## 2023-12-23 DIAGNOSIS — Z471 Aftercare following joint replacement surgery: Secondary | ICD-10-CM | POA: Diagnosis not present

## 2023-12-23 DIAGNOSIS — M6281 Muscle weakness (generalized): Secondary | ICD-10-CM | POA: Diagnosis not present

## 2023-12-23 NOTE — Telephone Encounter
-----   Message from Cheney B sent at 12/22/2023  4:24 PM CDT -----  Regarding: RRR- low b/p  VM on triage line from patient at 4:05pm.  Said that Saturday b/p 85/45, 77/48 so he stopped taking his b/p medication.  Today b/p 124/74, HR- 70's.  Said that he was taking Metoprolol and Losartan BID.  Call him at #[phone].

## 2023-12-23 NOTE — Telephone Encounter
 RC to patient. No answer. LVM asking him to continue off metoprolol and losartan since BP is within range now. Asked pt to keep a BP log until his upcoming appt on 6/20 with Dr. Melton Squires. Asked pt to call back with any further questions/concerns.

## 2023-12-25 DIAGNOSIS — M1712 Unilateral primary osteoarthritis, left knee: Secondary | ICD-10-CM | POA: Diagnosis not present

## 2023-12-25 DIAGNOSIS — M6281 Muscle weakness (generalized): Secondary | ICD-10-CM | POA: Diagnosis not present

## 2023-12-25 DIAGNOSIS — Z471 Aftercare following joint replacement surgery: Secondary | ICD-10-CM | POA: Diagnosis not present

## 2023-12-30 DIAGNOSIS — Z471 Aftercare following joint replacement surgery: Secondary | ICD-10-CM | POA: Diagnosis not present

## 2023-12-30 DIAGNOSIS — M6281 Muscle weakness (generalized): Secondary | ICD-10-CM | POA: Diagnosis not present

## 2023-12-30 DIAGNOSIS — M1712 Unilateral primary osteoarthritis, left knee: Secondary | ICD-10-CM | POA: Diagnosis not present

## 2023-12-31 ENCOUNTER — Encounter: Payer: Medicare HMO | Admitting: Internal Medicine

## 2024-01-01 DIAGNOSIS — M1712 Unilateral primary osteoarthritis, left knee: Secondary | ICD-10-CM | POA: Diagnosis not present

## 2024-01-01 DIAGNOSIS — Z471 Aftercare following joint replacement surgery: Secondary | ICD-10-CM | POA: Diagnosis not present

## 2024-01-01 DIAGNOSIS — M6281 Muscle weakness (generalized): Secondary | ICD-10-CM | POA: Diagnosis not present

## 2024-01-02 ENCOUNTER — Encounter: Admit: 2024-01-02 | Discharge: 2024-01-02 | Payer: MEDICARE

## 2024-01-06 ENCOUNTER — Ambulatory Visit (INDEPENDENT_AMBULATORY_CARE_PROVIDER_SITE_OTHER): Payer: Medicare HMO | Admitting: Internal Medicine

## 2024-01-06 ENCOUNTER — Encounter: Payer: Self-pay | Admitting: Internal Medicine

## 2024-01-06 ENCOUNTER — Ambulatory Visit: Payer: Self-pay | Admitting: Internal Medicine

## 2024-01-06 VITALS — BP 130/88 | HR 88 | Temp 98.0°F | Ht 71.0 in | Wt 171.7 lb

## 2024-01-06 DIAGNOSIS — M1712 Unilateral primary osteoarthritis, left knee: Secondary | ICD-10-CM | POA: Diagnosis not present

## 2024-01-06 DIAGNOSIS — E538 Deficiency of other specified B group vitamins: Secondary | ICD-10-CM | POA: Diagnosis not present

## 2024-01-06 DIAGNOSIS — E7800 Pure hypercholesterolemia, unspecified: Secondary | ICD-10-CM | POA: Diagnosis not present

## 2024-01-06 DIAGNOSIS — M6281 Muscle weakness (generalized): Secondary | ICD-10-CM | POA: Diagnosis not present

## 2024-01-06 DIAGNOSIS — Z0000 Encounter for general adult medical examination without abnormal findings: Secondary | ICD-10-CM | POA: Diagnosis not present

## 2024-01-06 DIAGNOSIS — K219 Gastro-esophageal reflux disease without esophagitis: Secondary | ICD-10-CM | POA: Diagnosis not present

## 2024-01-06 DIAGNOSIS — E119 Type 2 diabetes mellitus without complications: Secondary | ICD-10-CM | POA: Diagnosis not present

## 2024-01-06 DIAGNOSIS — C61 Malignant neoplasm of prostate: Secondary | ICD-10-CM | POA: Diagnosis not present

## 2024-01-06 DIAGNOSIS — E559 Vitamin D deficiency, unspecified: Secondary | ICD-10-CM

## 2024-01-06 DIAGNOSIS — Z471 Aftercare following joint replacement surgery: Secondary | ICD-10-CM | POA: Diagnosis not present

## 2024-01-06 LAB — VITAMIN D 25 HYDROXY (VIT D DEFICIENCY, FRACTURES): VITD: 29.46 ng/mL — ABNORMAL LOW (ref 30.00–100.00)

## 2024-01-06 LAB — CBC WITH DIFFERENTIAL/PLATELET
Basophils Absolute: 0 10*3/uL (ref 0.0–0.1)
Basophils Relative: 0.3 % (ref 0.0–3.0)
Eosinophils Absolute: 0.1 10*3/uL (ref 0.0–0.7)
Eosinophils Relative: 3.2 % (ref 0.0–5.0)
HCT: 40.6 % (ref 39.0–52.0)
Hemoglobin: 13.6 g/dL (ref 13.0–17.0)
Lymphocytes Relative: 20.8 % (ref 12.0–46.0)
Lymphs Abs: 0.9 10*3/uL (ref 0.7–4.0)
MCHC: 33.5 g/dL (ref 30.0–36.0)
MCV: 91.5 fl (ref 78.0–100.0)
Monocytes Absolute: 0.5 10*3/uL (ref 0.1–1.0)
Monocytes Relative: 10.8 % (ref 3.0–12.0)
Neutro Abs: 2.9 10*3/uL (ref 1.4–7.7)
Neutrophils Relative %: 64.9 % (ref 43.0–77.0)
Platelets: 161 10*3/uL (ref 150.0–400.0)
RBC: 4.43 Mil/uL (ref 4.22–5.81)
RDW: 13.3 % (ref 11.5–15.5)
WBC: 4.4 10*3/uL (ref 4.0–10.5)

## 2024-01-06 LAB — COMPREHENSIVE METABOLIC PANEL WITH GFR
ALT: 19 U/L (ref 0–53)
AST: 20 U/L (ref 0–37)
Albumin: 4.4 g/dL (ref 3.5–5.2)
Alkaline Phosphatase: 92 U/L (ref 39–117)
BUN: 16 mg/dL (ref 6–23)
CO2: 28 meq/L (ref 19–32)
Calcium: 9.5 mg/dL (ref 8.4–10.5)
Chloride: 101 meq/L (ref 96–112)
Creatinine, Ser: 0.69 mg/dL (ref 0.40–1.50)
GFR: 95.13 mL/min (ref >60.00)
Glucose, Bld: 122 mg/dL — ABNORMAL HIGH (ref 70–99)
Potassium: 3.8 meq/L (ref 3.5–5.1)
Sodium: 136 meq/L (ref 135–145)
Total Bilirubin: 1 mg/dL (ref 0.2–1.2)
Total Protein: 7.1 g/dL (ref 6.0–8.3)

## 2024-01-06 LAB — MICROALBUMIN / CREATININE URINE RATIO
Creatinine,U: 58.9 mg/dL
Microalb Creat Ratio: UNDETERMINED mg/g (ref 0.0–30.0)
Microalb, Ur: <0.7 mg/dL

## 2024-01-06 LAB — LIPID PANEL
Cholesterol: 126 mg/dL (ref 0–200)
HDL: 55.2 mg/dL (ref >39.00)
LDL Cholesterol: 59 mg/dL (ref 0–99)
NonHDL: 70.44
Total CHOL/HDL Ratio: 2
Triglycerides: 57 mg/dL (ref 0.0–149.0)
VLDL: 11.4 mg/dL (ref 0.0–40.0)

## 2024-01-06 LAB — HEMOGLOBIN A1C: Hgb A1c MFr Bld: 6.1 % (ref 4.6–6.5)

## 2024-01-06 LAB — VITAMIN B12: Vitamin B-12: 390 pg/mL (ref 211–911)

## 2024-01-06 MED ORDER — VITAMIN D (ERGOCALCIFEROL) 1.25 MG (50000 UNIT) PO CAPS: 50000.0000 [IU] | ORAL_CAPSULE | ORAL | 0 refills | Status: DC

## 2024-01-06 NOTE — Progress Notes (Signed)
 Established Patient Office Visit     CC/Reason for Visit: Annual preventive exam  HPI: Jeff Jenkins is a 68 y.o. male who is coming in today for the above mentioned reasons. Past Medical History is significant for: Hyperlipidemia, type 2 diabetes, vitamin B12 deficiency, coronary artery disease, GERD.  He is recovering well from his left knee replacement surgery.  Has routine dental care, is now due for an eye exam.  Immunizations and cancer screenings are up-to-date.   Past Medical/Surgical History: Past Medical History:  Diagnosis Date   Allergy    Arthritis    Broken ankle 2008   Coronary artery disease    Diverticulosis    ED (erectile dysfunction)    Facial fracture (HCC) 1979   GERD (gastroesophageal reflux disease)    Hearing loss in right ear    Hemorrhoids    Hiatal hernia    Hyperlipidemia    Hypertension    Peptic stricture of esophagus    Peptic ulcer disease 1989   Prostate cancer (HCC) 02/2009   Seasonal allergies    Type 2 diabetes mellitus (HCC)     Past Surgical History:  Procedure Laterality Date   ANKLE SURGERY Left 2008   CHOLECYSTECTOMY N/A 01/31/2020   Procedure: LAPAROSCOPIC CHOLECYSTECTOMY WITH INTRAOPERATIVE CHOLANGIOGRAM;  Surgeon: Signe Mitzie LABOR, MD;  Location: [facility];  Service: General;  Laterality: N/A;   FACIAL FRACTURE SURGERY     FRACTURE SURGERY  02/1978,01/2007   left cheek, left ankle   HERNIA REPAIR  09/26/2021   Sugery on 4 hernias in groin area  Dr [facility]   INGUINAL HERNIA REPAIR Left    JOINT REPLACEMENT  06/2020 and 10/2023   Right knee and Left knee   KNEE ARTHROSCOPY Left 2013   KNEE ARTHROSCOPY  07/03/2020   [facility]   PROSTATECTOMY  2011   TONSILLECTOMY     UPPER GASTROINTESTINAL ENDOSCOPY     VASECTOMY      Social History:  reports that he has never smoked. He has never used smokeless tobacco. He reports current alcohol use of about 10.0 standard drinks of alcohol per week. He  reports that he does not use drugs.  Allergies: No Known Allergies  Family History:  Family History  Problem Relation Age of Onset   Breast cancer Mother    Diabetes Mother    Heart attack Mother    Arthritis Mother    Cancer Mother    Heart disease Mother    Varicose Veins Mother    Alcohol abuse Father    COPD Father    Hearing loss Father    Stroke Brother    Arthritis Brother    Colon cancer Neg Hx    Esophageal cancer Neg Hx    Rectal cancer Neg Hx    Stomach cancer Neg Hx      Current Outpatient Medications:    acetaminophen  (TYLENOL ) 325 MG tablet, Take 650 mg by mouth every 6 (six) hours as needed for moderate pain., Disp: , Rfl:    aspirin EC 81 MG tablet, Take 81 mg by mouth daily. Swallow whole., Disp: , Rfl:    atorvastatin  (LIPITOR) 40 MG tablet, TAKE 1 TABLET BY MOUTH EVERY DAY, Disp: 90 tablet, Rfl: 1   Azelastine  HCl 137 MCG/SPRAY SOLN, PLACE 1 SPRAY INTO BOTH NOSTRILS 2 (TWO) TIMES DAILY, Disp: 90 mL, Rfl: 2   Calcium  Carbonate Antacid (TUMS PO), Take 2 tablets by mouth as needed., Disp: , Rfl:  cetirizine (ZYRTEC) 10 MG chewable tablet, Chew 10 mg by mouth daily., Disp: , Rfl:    cyanocobalamin  (VITAMIN B12) 500 MCG tablet, Take 500 mcg by mouth daily., Disp: , Rfl:    diclofenac (VOLTAREN) 75 MG EC tablet, Take 75 mg by mouth 2 (two) times daily as needed., Disp: , Rfl:    esomeprazole  (NEXIUM ) 40 MG capsule, TAKE 1 CAPSULE BY MOUTH DAILY BEFORE BREAKFAST., Disp: 90 capsule, Rfl: 3   ezetimibe  (ZETIA ) 10 MG tablet, TAKE 1 TABLET BY MOUTH DAILY, Disp: 90 tablet, Rfl: 0   Lancets (ONETOUCH ULTRASOFT) lancets, 1 each by Other route daily. Dx E11.9, Disp: 100 each, Rfl: 3   losartan  (COZAAR ) 25 MG tablet, TAKE 1 TABLET (25 MG TOTAL) BY MOUTH DAILY., Disp: 90 tablet, Rfl: 3   ONETOUCH ULTRA test strip, CHECK BLOOD SUGAR DAILY, Disp: 100 strip, Rfl: 3   sildenafil (VIAGRA) 100 MG tablet, TAKE 1 TABLET BY MOUTH DAILY AS NEEDED, Disp: 10 tablet, Rfl: 11    SYNJARDY  XR 25-1000 MG TB24, TAKE 1 TABLET BY MOUTH EVERY DAY, Disp: 90 tablet, Rfl: 1   tadalafil (CIALIS) 5 MG tablet, Take 5 mg by mouth daily as needed for erectile dysfunction. , Disp: , Rfl:    Testosterone  30 MG/ACT SOLN, Place onto the skin daily., Disp: , Rfl:    tizanidine (ZANAFLEX) 2 MG capsule, 2 mg. Take one tab twice daily as needed, Disp: , Rfl:    ipratropium (ATROVENT ) 0.02 % nebulizer solution, Take 2.5 mLs (0.5 mg total) by nebulization 4 (four) times daily., Disp: 75 mL, Rfl: 12  Review of Systems:  Negative unless indicated in HPI.   Physical Exam: Vitals:   01/06/24 0932  BP: 130/88  Pulse: 88  Temp: 98 F (36.7 C)  TempSrc: Oral  SpO2: 99%  Weight: 171 lb 11.2 oz (77.9 kg)  Height: 5' 11 (1.803 m)    Body mass index is 23.95 kg/m.   Physical Exam Vitals reviewed.  Constitutional:      General: He is not in acute distress.    Appearance: Normal appearance. He is not ill-appearing, toxic-appearing or diaphoretic.  HENT:     Head: Normocephalic.     Right Ear: Tympanic membrane, ear canal and external ear normal. There is no impacted cerumen.     Left Ear: Tympanic membrane, ear canal and external ear normal. There is no impacted cerumen.     Nose: Nose normal.     Mouth/Throat:     Mouth: Mucous membranes are moist.     Pharynx: Oropharynx is clear. No oropharyngeal exudate or posterior oropharyngeal erythema.   Eyes:     General: No scleral icterus.       Right eye: No discharge.        Left eye: No discharge.     Conjunctiva/sclera: Conjunctivae normal.     Pupils: Pupils are equal, round, and reactive to light.   Neck:     Vascular: No carotid bruit.   Cardiovascular:     Rate and Rhythm: Normal rate and regular rhythm.     Pulses: Normal pulses.     Heart sounds: Normal heart sounds.  Pulmonary:     Effort: Pulmonary effort is normal. No respiratory distress.     Breath sounds: Normal breath sounds.  Abdominal:     General: Abdomen  is flat. Bowel sounds are normal.     Palpations: Abdomen is soft.   Musculoskeletal:        General: Normal  range of motion.     Cervical back: Normal range of motion.   Skin:    General: Skin is warm and dry.   Neurological:     General: No focal deficit present.     Mental Status: He is alert and oriented to person, place, and time. Mental status is at baseline.   Psychiatric:        Mood and Affect: Mood normal.        Behavior: Behavior normal.        Thought Content: Thought content normal.        Judgment: Judgment normal.     Flowsheet Row Office Visit from 12/30/2022 in [facility]  PHQ-9 Total Score 0    Impression and Plan:  Encounter for preventive health examination  Type 2 diabetes mellitus without obesity (HCC) -     Hemoglobin A1c; Future -     CBC with Differential/Platelet; Future -     Comprehensive metabolic panel with GFR; Future -     Microalbumin / creatinine urine ratio; Future  Pure hypercholesterolemia -     Lipid panel; Future  Prostate cancer (HCC)  Vitamin B12 deficiency -     Vitamin B12; Future -     VITAMIN D 25 Hydroxy (Vit-D Deficiency, Fractures); Future  Gastroesophageal reflux disease, unspecified whether esophagitis present   -Recommend routine eye and dental care. -Healthy lifestyle discussed in detail. -Labs to be updated today. -Prostate cancer screening: PSA monitored by urology due to history of prostate cancer Health Maintenance  Topic Date Due   COVID-19 Vaccine (6 - 2024-25 season) 03/16/2023   Yearly kidney health urinalysis for diabetes  12/30/2023   Complete foot exam   12/30/2023   Eye exam for diabetics  01/03/2024   Flu Shot  02/13/2024   Hemoglobin A1C  05/12/2024   Yearly kidney function blood test for diabetes  11/10/2024   Medicare Annual Wellness Visit  11/17/2024   Pneumococcal Vaccine for age over 33 (3 of 3 - PCV20 or PCV21) 12/22/2025   Colon Cancer Screening   01/28/2027   DTaP/Tdap/Td vaccine (3 - Td or Tdap) 02/10/2029   Hepatitis C Screening  Completed   Zoster (Shingles) Vaccine  Completed   Hepatitis B Vaccine  Aged Out   HPV Vaccine  Aged Out   Meningitis B Vaccine  Aged Out       Jeff Timothy Theophilus Andrews, MD [facility]

## 2024-01-08 DIAGNOSIS — Z471 Aftercare following joint replacement surgery: Secondary | ICD-10-CM | POA: Diagnosis not present

## 2024-01-08 DIAGNOSIS — M6281 Muscle weakness (generalized): Secondary | ICD-10-CM | POA: Diagnosis not present

## 2024-01-08 DIAGNOSIS — M1712 Unilateral primary osteoarthritis, left knee: Secondary | ICD-10-CM | POA: Diagnosis not present

## 2024-01-13 DIAGNOSIS — M1712 Unilateral primary osteoarthritis, left knee: Secondary | ICD-10-CM | POA: Diagnosis not present

## 2024-01-13 DIAGNOSIS — Z471 Aftercare following joint replacement surgery: Secondary | ICD-10-CM | POA: Diagnosis not present

## 2024-01-13 DIAGNOSIS — M6281 Muscle weakness (generalized): Secondary | ICD-10-CM | POA: Diagnosis not present

## 2024-01-15 DIAGNOSIS — M1712 Unilateral primary osteoarthritis, left knee: Secondary | ICD-10-CM | POA: Diagnosis not present

## 2024-01-15 DIAGNOSIS — Z471 Aftercare following joint replacement surgery: Secondary | ICD-10-CM | POA: Diagnosis not present

## 2024-01-15 DIAGNOSIS — M6281 Muscle weakness (generalized): Secondary | ICD-10-CM | POA: Diagnosis not present

## 2024-01-21 DIAGNOSIS — E291 Testicular hypofunction: Secondary | ICD-10-CM | POA: Diagnosis not present

## 2024-01-21 DIAGNOSIS — Z5189 Encounter for other specified aftercare: Secondary | ICD-10-CM | POA: Diagnosis not present

## 2024-01-28 DIAGNOSIS — N393 Stress incontinence (female) (male): Secondary | ICD-10-CM | POA: Diagnosis not present

## 2024-01-28 DIAGNOSIS — Z8546 Personal history of malignant neoplasm of prostate: Secondary | ICD-10-CM | POA: Diagnosis not present

## 2024-01-28 DIAGNOSIS — R3912 Poor urinary stream: Secondary | ICD-10-CM | POA: Diagnosis not present

## 2024-01-28 DIAGNOSIS — R3914 Feeling of incomplete bladder emptying: Secondary | ICD-10-CM | POA: Diagnosis not present

## 2024-01-28 DIAGNOSIS — E291 Testicular hypofunction: Secondary | ICD-10-CM | POA: Diagnosis not present

## 2024-01-30 ENCOUNTER — Ambulatory Visit (HOSPITAL_BASED_OUTPATIENT_CLINIC_OR_DEPARTMENT_OTHER): Admitting: Cardiology

## 2024-01-30 ENCOUNTER — Encounter (HOSPITAL_BASED_OUTPATIENT_CLINIC_OR_DEPARTMENT_OTHER): Payer: Self-pay | Admitting: Cardiology

## 2024-01-30 VITALS — BP 120/70 | HR 80 | Ht 71.0 in | Wt 172.8 lb

## 2024-01-30 DIAGNOSIS — I493 Ventricular premature depolarization: Secondary | ICD-10-CM

## 2024-01-30 DIAGNOSIS — E119 Type 2 diabetes mellitus without complications: Secondary | ICD-10-CM

## 2024-01-30 DIAGNOSIS — E7800 Pure hypercholesterolemia, unspecified: Secondary | ICD-10-CM | POA: Diagnosis not present

## 2024-01-30 DIAGNOSIS — I2510 Atherosclerotic heart disease of native coronary artery without angina pectoris: Secondary | ICD-10-CM | POA: Diagnosis not present

## 2024-01-30 NOTE — Patient Instructions (Signed)
 Medication Instructions:  Continue current medications *If you need a refill on your cardiac medications before your next appointment, please call your pharmacy*  Lab Work: none If you have labs (blood work) drawn today and your tests are completely normal, you will receive your results only by: MyChart Message (if you have MyChart) OR A paper copy in the mail If you have any lab test that is abnormal or we need to change your treatment, we will call you to review the results.  Testing/Procedures: none  Follow-Up: At [facility], you and your health needs are our priority.  As part of our continuing mission to provide you with exceptional heart care, our providers are all part of one team.  This team includes your primary Cardiologist (physician) and Advanced Practice Providers or APPs (Physician Assistants and Nurse Practitioners) who all work together to provide you with the care you need, when you need it.  Your next appointment:   1 year(s)  Provider:   Shelda Bruckner, MD    We recommend signing up for the patient portal called MyChart.  Sign up information is provided on this After Visit Summary.  MyChart is used to connect with patients for Virtual Visits (Telemedicine).  Patients are able to view lab/test results, encounter notes, upcoming appointments, etc.  Non-urgent messages can be sent to your provider as well.   To learn more about what you can do with MyChart, go to [email or web address].   Other Instructions none

## 2024-01-30 NOTE — Progress Notes (Signed)
 Cardiology Office Note:  .   Date:  01/30/2024  ID:  Jeff Jenkins, DOB [birth year], MRN [id number] PCP: Theophilus Andrews, Tully GRADE, MD  [facility] Providers Cardiologist:  Shelda Bruckner, MD {  History of Present Illness: Jeff Jenkins   Jeff Jenkins is a 68 y.o. male with a hx of significant coronary calcification, nonobstructive CAD, family history of heart disease, type II diabetes lifestyle controlled who is seen for follow up today. He was initially seen 04/04/19 as a new consult at the request of Theophilus Andrews, Jonna* for the evaluation and management of elevated cardiovascular risk.   CV history/risk factors: Strong family history: lost mother to his heart attack suddenly at age 69 (had evidence of old MI that was unknown). Lost his brother to a massive stroke at age 3.  Today: Doing well after his most recent knee surgery, still with some L knee swelling. No new symptoms. Lightheadedness no longer an issue. No palpitaitons.  Reviewed lipids, recommendations.  ROS: Denies chest pain, shortness of breath at rest or with normal exertion. No PND, orthopnea, LE edema or unexpected weight gain. No syncope or palpitations. ROS otherwise negative except as noted.   Studies Reviewed: Jeff Jenkins    EKG:  EKG Interpretation Date/Time:  Friday January 30 2024 08:35:52 EDT Ventricular Rate:  81 PR Interval:  188 QRS Duration:  76 QT Interval:  360 QTC Calculation: 418 R Axis:   28  Text Interpretation: Normal sinus rhythm Normal ECG When compared with ECG of 17-Jan-2007 13:12, No significant change was found Confirmed by Bruckner Shelda [phone]) on 01/30/2024 9:03:24 AM    Physical Exam:   VS:  BP 120/70   Pulse 80   Ht 5' 11 (1.803 m)   Wt 172 lb 12.8 oz (78.4 kg)   SpO2 96%   BMI 24.10 kg/m    Wt Readings from Last 3 Encounters:  01/30/24 172 lb 12.8 oz (78.4 kg)  01/06/24 171 lb 11.2 oz (77.9 kg)  11/14/23 175 lb (79.4 kg)    GEN: Well nourished, well  developed in no acute distress HEENT: Normal, moist mucous membranes NECK: No JVD CARDIAC: regular rhythm, normal S1 and S2, no rubs or gallops. No murmur. VASCULAR: Radial and DP pulses 2+ bilaterally. No carotid bruits RESPIRATORY:  Clear to auscultation without rales, wheezing or rhonchi  ABDOMEN: Soft, non-tender, non-distended MUSCULOSKELETAL:  Ambulates independently SKIN: Warm and dry, no edema NEUROLOGIC:  Alert and oriented x 3. No focal neuro deficits noted. PSYCHIATRIC:  Normal affect    ASSESSMENT AND PLAN: .    Nonobstructive CAD Significant coronary calcification -Ca score 2319 (98th %ile) -while no plaque with >70% stenosis suggest that chest pain is not ischemic in origin, his very high calcium  score and mixed/lipid rich plaque in proximal LAD is concerning. Has diffuse calcified plaque in 3 vessels as well as focal mixed/lipid rich plaque -continue aspirin 81 mg  -tolerating statin, as below -reviewed red flags symptoms that need immediate medical attention   Hypercholesterolemia: goal LDL <55 given CAD and DM -he is tolerating atorvastatin  40 mg daily, continue -on ezetimibe  10 mg daily as well -last lipids 6.24.25: Tchol 126, TG 57, HDL 55, LDL 59 -discussed intensifying therapy to PCSK9i if next LDL not at goal -discussed lpa, wishes to hold until therapies are available    Erectile dysfunction: see prior discussion re: PDE5i   PVCs: burden <3%.  -currently asymptomatic, but if worsens consider echo in the future. Have discussed beta blockers, declines  currently. Monitor for symptoms.    Type II diabetes without obesity: BMI 24 -on synjardy , which contains empagliflozin  and metformin . Therefore is receiving SGLT2i benefit with CAD  CV risk counseling and prevention -recommend heart healthy/Mediterranean diet, with whole grains, fruits, vegetable, fish, lean meats, nuts, and olive oil. Limit salt. -recommend moderate walking, 3-5 times/week for 30-50 minutes  each session. Aim for at least 150 minutes.week. Goal should be pace of 3 miles/hours, or walking 1.5 miles in 30 minutes -recommend avoidance of tobacco products. Avoid excess alcohol.  Dispo: 1 year or sooner as needed  Signed, Shelda Bruckner, MD   Shelda Bruckner, MD, PhD, [facility]  [facility]  [place]  [facility] at [facility] at [facility] [address] [place], [place] [address] [phone]

## 2024-02-19 ENCOUNTER — Other Ambulatory Visit: Payer: Self-pay | Admitting: Internal Medicine

## 2024-02-19 DIAGNOSIS — E7800 Pure hypercholesterolemia, unspecified: Secondary | ICD-10-CM

## 2024-02-20 ENCOUNTER — Encounter: Payer: Self-pay | Admitting: Internal Medicine

## 2024-02-20 DIAGNOSIS — E7800 Pure hypercholesterolemia, unspecified: Secondary | ICD-10-CM

## 2024-02-20 MED ORDER — EZETIMIBE 10 MG PO TABS: 10.0000 mg | ORAL_TABLET | Freq: Every day | ORAL | 2 refills | Status: AC

## 2024-03-10 DIAGNOSIS — H53143 Visual discomfort, bilateral: Secondary | ICD-10-CM | POA: Diagnosis not present

## 2024-03-10 DIAGNOSIS — H524 Presbyopia: Secondary | ICD-10-CM | POA: Diagnosis not present

## 2024-03-10 DIAGNOSIS — H52221 Regular astigmatism, right eye: Secondary | ICD-10-CM | POA: Diagnosis not present

## 2024-03-10 DIAGNOSIS — H2513 Age-related nuclear cataract, bilateral: Secondary | ICD-10-CM | POA: Diagnosis not present

## 2024-03-10 DIAGNOSIS — H5203 Hypermetropia, bilateral: Secondary | ICD-10-CM | POA: Diagnosis not present

## 2024-03-10 LAB — HM DIABETES EYE EXAM

## 2024-03-16 DIAGNOSIS — R102 Pelvic and perineal pain: Secondary | ICD-10-CM | POA: Diagnosis not present

## 2024-03-16 DIAGNOSIS — R3912 Poor urinary stream: Secondary | ICD-10-CM | POA: Diagnosis not present

## 2024-03-18 ENCOUNTER — Encounter: Payer: Self-pay | Admitting: Internal Medicine

## 2024-03-19 DIAGNOSIS — M1712 Unilateral primary osteoarthritis, left knee: Secondary | ICD-10-CM | POA: Diagnosis not present

## 2024-03-19 DIAGNOSIS — M25552 Pain in left hip: Secondary | ICD-10-CM | POA: Diagnosis not present

## 2024-03-19 DIAGNOSIS — M25562 Pain in left knee: Secondary | ICD-10-CM | POA: Diagnosis not present

## 2024-03-19 DIAGNOSIS — Z96652 Presence of left artificial knee joint: Secondary | ICD-10-CM | POA: Diagnosis not present

## 2024-03-23 ENCOUNTER — Other Ambulatory Visit (INDEPENDENT_AMBULATORY_CARE_PROVIDER_SITE_OTHER)

## 2024-03-23 DIAGNOSIS — E559 Vitamin D deficiency, unspecified: Secondary | ICD-10-CM | POA: Diagnosis not present

## 2024-03-23 LAB — VITAMIN D 25 HYDROXY (VIT D DEFICIENCY, FRACTURES): VITD: 50.13 ng/mL (ref 30.00–100.00)

## 2024-03-24 ENCOUNTER — Other Ambulatory Visit: Payer: Self-pay | Admitting: Internal Medicine

## 2024-03-24 DIAGNOSIS — E559 Vitamin D deficiency, unspecified: Secondary | ICD-10-CM

## 2024-03-24 DIAGNOSIS — M25562 Pain in left knee: Secondary | ICD-10-CM | POA: Diagnosis not present

## 2024-03-24 DIAGNOSIS — Z96652 Presence of left artificial knee joint: Secondary | ICD-10-CM | POA: Diagnosis not present

## 2024-03-28 ENCOUNTER — Ambulatory Visit: Payer: Self-pay | Admitting: Internal Medicine

## 2024-04-13 ENCOUNTER — Encounter: Admit: 2024-04-13 | Discharge: 2024-04-13 | Payer: MEDICARE

## 2024-04-29 DIAGNOSIS — E291 Testicular hypofunction: Secondary | ICD-10-CM | POA: Diagnosis not present

## 2024-05-07 ENCOUNTER — Other Ambulatory Visit: Payer: Self-pay | Admitting: Internal Medicine

## 2024-05-07 DIAGNOSIS — E119 Type 2 diabetes mellitus without complications: Secondary | ICD-10-CM

## 2024-05-07 DIAGNOSIS — M79641 Pain in right hand: Secondary | ICD-10-CM | POA: Diagnosis not present

## 2024-05-12 ENCOUNTER — Encounter: Payer: Self-pay | Admitting: Internal Medicine

## 2024-06-04 ENCOUNTER — Encounter: Payer: Self-pay | Admitting: Internal Medicine

## 2024-06-14 ENCOUNTER — Telehealth: Payer: Self-pay | Admitting: Gastroenterology

## 2024-06-14 NOTE — Telephone Encounter (Signed)
 Good Afternoon Dr Suzann   Patient preferred provider   Patient requesting transfer of care over to you due to his spouse being a patient of yours. Also requesting transfer due to bedside manner.   Previous  patient of Dr Aneita. Most recent o/v with PA we review by Dr Charlanne.  Please review and advise on scheduling.   Thank you

## 2024-06-14 NOTE — Telephone Encounter (Signed)
 Absolutely. OK by me He will be in good hands RG

## 2024-06-14 NOTE — Telephone Encounter (Signed)
 Good Afternoon Dr Charlanne   Previous patient of Dr Aneita. Recently seen by PA Alan and patient chart was review by you. Patient requesting to see Dr Suzann due his wife being a patient of hers. Please review and advise.   Thank you

## 2024-06-15 NOTE — Telephone Encounter (Signed)
 Patient will schedule ov for April 2026.

## 2024-06-18 DIAGNOSIS — G5601 Carpal tunnel syndrome, right upper limb: Secondary | ICD-10-CM | POA: Diagnosis not present

## 2024-06-18 DIAGNOSIS — M79641 Pain in right hand: Secondary | ICD-10-CM | POA: Diagnosis not present

## 2024-07-05 DIAGNOSIS — Z96652 Presence of left artificial knee joint: Secondary | ICD-10-CM | POA: Diagnosis not present

## 2024-07-05 DIAGNOSIS — M25562 Pain in left knee: Secondary | ICD-10-CM | POA: Diagnosis not present

## 2024-07-23 ENCOUNTER — Encounter: Admit: 2024-07-23 | Discharge: 2024-07-23 | Payer: MEDICARE

## 2024-07-26 ENCOUNTER — Encounter: Admit: 2024-07-26 | Discharge: 2024-07-26 | Payer: MEDICARE

## 2024-07-28 ENCOUNTER — Encounter: Admit: 2024-07-28 | Discharge: 2024-07-28 | Payer: MEDICARE

## 2024-08-02 ENCOUNTER — Encounter: Payer: Self-pay | Admitting: Internal Medicine

## 2024-08-04 ENCOUNTER — Other Ambulatory Visit: Payer: Self-pay | Admitting: Internal Medicine

## 2024-08-04 MED ORDER — DAPAGLIFLOZIN PRO-METFORMIN ER 10-1000 MG PO TB24: 1.0000 | ORAL_TABLET | Freq: Every day | ORAL | 1 refills | Status: DC

## 2024-08-05 ENCOUNTER — Encounter: Admit: 2024-08-05 | Discharge: 2024-08-05 | Payer: MEDICARE

## 2024-08-09 ENCOUNTER — Other Ambulatory Visit (HOSPITAL_COMMUNITY): Payer: Self-pay

## 2024-08-09 ENCOUNTER — Telehealth: Payer: Self-pay

## 2024-08-09 NOTE — Telephone Encounter (Signed)
 Pharmacy Patient Advocate Encounter   Received notification from [facility] KEY that prior authorization for Xigduo  XR [id number] is required/requested.   Insurance verification completed.   The patient is insured through [employer] [facility].   Per test claim: Refill too soon. PA is not needed at this time. Medication was filled 08/07/24. Next eligible fill date is (no date provided).

## 2024-08-11 ENCOUNTER — Other Ambulatory Visit (HOSPITAL_BASED_OUTPATIENT_CLINIC_OR_DEPARTMENT_OTHER): Payer: Self-pay | Admitting: Cardiology

## 2024-08-11 DIAGNOSIS — I10 Essential (primary) hypertension: Secondary | ICD-10-CM

## 2024-08-11 MED ORDER — METFORMIN HCL 1000 MG PO TABS: 1000.0000 mg | ORAL_TABLET | Freq: Every day | ORAL | 1 refills | Status: AC

## 2024-08-11 MED ORDER — EMPAGLIFLOZIN 25 MG PO TABS: 25.0000 mg | ORAL_TABLET | Freq: Every day | ORAL | 1 refills | Status: AC

## 2024-08-14 ENCOUNTER — Other Ambulatory Visit: Payer: Self-pay | Admitting: Internal Medicine

## 2024-08-14 DIAGNOSIS — E7800 Pure hypercholesterolemia, unspecified: Secondary | ICD-10-CM

## 2025-01-06 ENCOUNTER — Encounter: Admitting: Internal Medicine
